# Patient Record
Sex: Male | Born: 1975 | Race: Black or African American | Hispanic: No | Marital: Married | Smoking: Former smoker
Health system: Southern US, Community
[De-identification: ages and names within clinical notes are randomized; demographics above are authoritative.]

## PROBLEM LIST (undated history)

## (undated) DIAGNOSIS — I639 Cerebral infarction, unspecified: Secondary | ICD-10-CM

## (undated) DIAGNOSIS — H269 Unspecified cataract: Secondary | ICD-10-CM

## (undated) DIAGNOSIS — L209 Atopic dermatitis, unspecified: Secondary | ICD-10-CM

## (undated) DIAGNOSIS — E669 Obesity, unspecified: Secondary | ICD-10-CM

## (undated) DIAGNOSIS — Q5310 Unspecified undescended testicle, unilateral: Secondary | ICD-10-CM

## (undated) HISTORY — PX: CHOLECYSTECTOMY: SHX55

---
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
## 2005-09-21 ENCOUNTER — Ambulatory Visit: Payer: Self-pay | Admitting: Family Medicine

## 2005-09-25 ENCOUNTER — Ambulatory Visit: Payer: Self-pay | Admitting: Family Medicine

## 2005-10-11 ENCOUNTER — Ambulatory Visit: Payer: Self-pay | Admitting: Nurse Practitioner

## 2005-10-17 ENCOUNTER — Ambulatory Visit: Payer: Self-pay | Admitting: *Deleted

## 2005-10-23 ENCOUNTER — Ambulatory Visit: Payer: Self-pay | Admitting: Family Medicine

## 2005-10-30 ENCOUNTER — Ambulatory Visit: Payer: Self-pay | Admitting: Family Medicine

## 2005-11-22 ENCOUNTER — Ambulatory Visit: Payer: Self-pay | Admitting: Family Medicine

## 2005-11-27 ENCOUNTER — Ambulatory Visit: Payer: Self-pay | Admitting: Family Medicine

## 2006-02-03 ENCOUNTER — Emergency Department (HOSPITAL_COMMUNITY): Admission: EM | Admit: 2006-02-03 | Discharge: 2006-02-03 | Payer: Self-pay | Admitting: Family Medicine

## 2006-03-13 ENCOUNTER — Ambulatory Visit: Payer: Self-pay | Admitting: Family Medicine

## 2006-05-01 ENCOUNTER — Emergency Department (HOSPITAL_COMMUNITY): Admission: EM | Admit: 2006-05-01 | Discharge: 2006-05-01 | Payer: Self-pay | Admitting: Emergency Medicine

## 2011-04-23 DIAGNOSIS — L309 Dermatitis, unspecified: Secondary | ICD-10-CM | POA: Insufficient documentation

## 2015-12-11 HISTORY — PX: CATARACT EXTRACTION W/ INTRAOCULAR LENS IMPLANT: SHX1309

## 2015-12-20 DIAGNOSIS — L209 Atopic dermatitis, unspecified: Secondary | ICD-10-CM | POA: Insufficient documentation

## 2021-07-26 ENCOUNTER — Encounter (HOSPITAL_COMMUNITY): Payer: Self-pay | Admitting: Emergency Medicine

## 2021-07-26 ENCOUNTER — Emergency Department (HOSPITAL_COMMUNITY): Payer: No Typology Code available for payment source

## 2021-07-26 ENCOUNTER — Emergency Department (HOSPITAL_COMMUNITY)
Admission: EM | Admit: 2021-07-26 | Discharge: 2021-07-27 | Disposition: A | Discharge status: Home / Self Care | Payer: No Typology Code available for payment source | Attending: Emergency Medicine | Admitting: Emergency Medicine

## 2021-07-26 ENCOUNTER — Other Ambulatory Visit: Payer: Self-pay

## 2021-07-26 DIAGNOSIS — S161XXA Strain of muscle, fascia and tendon at neck level, initial encounter: Secondary | ICD-10-CM | POA: Insufficient documentation

## 2021-07-26 DIAGNOSIS — S169XXA Unspecified injury of muscle, fascia and tendon at neck level, initial encounter: Secondary | ICD-10-CM | POA: Diagnosis present

## 2021-07-26 DIAGNOSIS — M546 Pain in thoracic spine: Secondary | ICD-10-CM | POA: Insufficient documentation

## 2021-07-26 DIAGNOSIS — R519 Headache, unspecified: Secondary | ICD-10-CM | POA: Diagnosis not present

## 2021-07-26 DIAGNOSIS — Y92410 Unspecified street and highway as the place of occurrence of the external cause: Secondary | ICD-10-CM | POA: Diagnosis not present
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
## 2021-07-27 MED ORDER — IBUPROFEN 800 MG PO TABS: 800.0000 mg | ORAL_TABLET | Freq: Three times a day (TID) | ORAL | 0 refills | Status: DC

## 2021-07-27 MED ORDER — CYCLOBENZAPRINE HCL 10 MG PO TABS
10.0000 mg | ORAL_TABLET | Freq: Once | ORAL | Status: AC
  Administered 2021-07-27: 10 mg via ORAL
  Filled 2021-07-27: qty 1

## 2021-07-27 MED ORDER — CYCLOBENZAPRINE HCL 10 MG PO TABS: 10.0000 mg | ORAL_TABLET | Freq: Three times a day (TID) | ORAL | 0 refills | Status: DC | PRN

## 2021-07-27 MED ORDER — IBUPROFEN 800 MG PO TABS
800.0000 mg | ORAL_TABLET | Freq: Once | ORAL | Status: AC
  Administered 2021-07-27: 800 mg via ORAL
  Filled 2021-07-27: qty 1
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
## 2021-07-27 NOTE — ED Notes (Signed)
C-collar removed by pa

## 2022-04-23 DIAGNOSIS — N183 Chronic kidney disease, stage 3 unspecified: Secondary | ICD-10-CM | POA: Insufficient documentation

## 2022-12-09 ENCOUNTER — Other Ambulatory Visit: Payer: Self-pay

## 2022-12-09 ENCOUNTER — Emergency Department (HOSPITAL_COMMUNITY)
Admission: EM | Admit: 2022-12-09 | Discharge: 2022-12-09 | Disposition: A | Discharge status: Home / Self Care | Payer: PRIVATE HEALTH INSURANCE | Attending: Emergency Medicine | Admitting: Emergency Medicine

## 2022-12-09 DIAGNOSIS — J101 Influenza due to other identified influenza virus with other respiratory manifestations: Secondary | ICD-10-CM | POA: Diagnosis not present

## 2022-12-09 DIAGNOSIS — Z1152 Encounter for screening for COVID-19: Secondary | ICD-10-CM | POA: Insufficient documentation

## 2022-12-09 DIAGNOSIS — R509 Fever, unspecified: Secondary | ICD-10-CM | POA: Diagnosis present

## 2022-12-09 LAB — RESP PANEL BY RT-PCR (RSV, FLU A&B, COVID)  RVPGX2
Influenza A by PCR: NEGATIVE
Influenza B by PCR: POSITIVE — AB
Resp Syncytial Virus by PCR: NEGATIVE
SARS Coronavirus 2 by RT PCR: NEGATIVE

## 2022-12-09 MED ORDER — ACETAMINOPHEN 500 MG PO TABS
1000.0000 mg | ORAL_TABLET | Freq: Once | ORAL | Status: AC
  Administered 2022-12-09: 1000 mg via ORAL
  Filled 2022-12-09: qty 2

## 2022-12-09 NOTE — ED Triage Notes (Signed)
Patient coming to ED for evaluation of fever, cough, congestion, and generalized body aches.  Reports multiple family member are positive for COVID.  Symptoms started 3 days ago.  Has been taking OTC with minimal relief

## 2022-12-09 NOTE — ED Provider Notes (Signed)
[facility]-EMERGENCY DEPT Provider Note   CSN: [id number] Arrival date & time: 12/09/22  1856     History  Chief Complaint  Patient presents with   Fever   Nasal Congestion    Wayne Lee is a 46 y.o. male.  Patient presents to the emergency department complaining of fever, cough, congestion, and generalized bodyaches which have been ongoing for 3 days.  Patient reports that family members have tested positive for COVID but he has tested negative for COVID 3 times.  He has been over-the-counter pain medication with little relief.  He denies shortness of breath, chest pain, nausea, vomiting.  No relevant past medical history on file  HPI     Home Medications Prior to Admission medications   Medication Sig Start Date End Date Taking? Authorizing Provider  cyclobenzaprine (FLEXERIL) 10 MG tablet Take 1 tablet (10 mg total) by mouth 3 (three) times daily as needed for muscle spasms. 07/27/21   Roxy Horseman, PA-C  ibuprofen (ADVIL) 800 MG tablet Take 1 tablet (800 mg total) by mouth 3 (three) times daily. 07/27/21   Roxy Horseman, PA-C      Allergies    Penicillins    Review of Systems   Review of Systems  Constitutional:  Positive for chills and fever.  HENT:  Positive for congestion.   Respiratory:  Positive for cough.   Musculoskeletal:  Positive for myalgias.    Physical Exam Updated Vital Signs BP (!) 149/85 (BP Location: Left Arm)   Pulse 88   Temp (!) 102.7 F (39.3 C) (Oral)   Resp 18   Ht 5\' 8"  (1.727 m)   Wt 109.5 kg   SpO2 95%   BMI 36.72 kg/m  Physical Exam Vitals and nursing note reviewed.  Constitutional:      General: He is not in acute distress.    Appearance: He is well-developed.  HENT:     Head: Normocephalic and atraumatic.  Eyes:     Conjunctiva/sclera: Conjunctivae normal.  Cardiovascular:     Rate and Rhythm: Normal rate and regular rhythm.     Heart sounds: No murmur heard. Pulmonary:     Effort:  Pulmonary effort is normal. No respiratory distress.     Breath sounds: Normal breath sounds.  Abdominal:     Palpations: Abdomen is soft.     Tenderness: There is no abdominal tenderness.  Musculoskeletal:        General: No swelling.     Cervical back: Neck supple.  Skin:    General: Skin is warm and dry.     Capillary Refill: Capillary refill takes less than 2 seconds.  Neurological:     Mental Status: He is alert.  Psychiatric:        Mood and Affect: Mood normal.     ED Results / Procedures / Treatments   Labs (all labs ordered are listed, but only abnormal results are displayed) Labs Reviewed  RESP PANEL BY RT-PCR (RSV, FLU A&B, COVID)  RVPGX2 - Abnormal; Notable for the following components:      Result Value   Influenza B by PCR POSITIVE (*)    All other components within normal limits    EKG None  Radiology No results found.  Procedures Procedures    Medications Ordered in ED Medications  acetaminophen (TYLENOL) tablet 1,000 mg (1,000 mg Oral Given 12/09/22 2029)    ED Course/ Medical Decision Making/ A&P  Medical Decision Making Risk OTC drugs.   Patient presents chief complaint of fever, cough, congestion, body aches.  Differential diagnosis includes but is not limited to COVID-19, influenza, RSV, other viral illnesses, and others  No recent relevant past medical history was on file for review  I ordered and interpreted labs.  Pertinent results include positive influenza B test result  The patient's lungs are clear to auscultation bilaterally.  There is no indication at this time for chest imaging  I ordered the patient Tylenol for his fever.  Upon reassessment the patient's fever had decreased and he was feeling somewhat better  The patient tested positive for influenza B.  This is consistent with the symptoms he has described.  Plan to discharge patient home with recommendations for over-the-counter medications for  continued therapy at home.  He is well outside the window for Tamiflu at this time.  Patient provided work note at his request        Final Clinical Impression(s) / ED Diagnoses Final diagnoses:  Influenza B    Rx / DC Orders ED Discharge Orders     None         Pamala Duffel 12/09/22 2132    Bethann Berkshire, MD 12/12/22 1243

## 2022-12-09 NOTE — ED Provider Triage Note (Signed)
Emergency Medicine Provider Triage Evaluation Note  Jahi Roza , a 46 y.o. male  was evaluated in triage.  Pt complains of 3 days of bodyaches, chills, cough, nasal congestion.  Patient states that multiple family members were diagnosed with COVID but he has had 3 negative COVID test at home.  Patient denies shortness of breath, chest pain, abdominal pain, nausea, vomiting  Review of Systems  Positive: As above Negative: As above  Physical Exam  There were no vitals taken for this visit. Gen:   Awake, no distress   Resp:  Normal effort  MSK:   Moves extremities without difficulty  Other:    Medical Decision Making  Medically screening exam initiated at 8:02 PM.  Appropriate orders placed.  Omarion Werling was informed that the remainder of the evaluation will be completed by another provider, this initial triage assessment does not replace that evaluation, and the importance of remaining in the ED until their evaluation is complete.     Pamala Duffel 12/09/22 2003

## 2022-12-09 NOTE — Discharge Instructions (Addendum)
You were diagnosed today with influenza.  Please take over-the-counter medication such as acetaminophen and ibuprofen as needed for fever and pain control.  Your body will fight this illness on its own over time.  Please get plenty of rest and hydrate as you are able.  If you develop any life-threatening symptoms please return to the emergency department

## 2023-05-05 IMAGING — CT CT CERVICAL SPINE W/O CM
3 of 4 series · 12 of 33 positions shown, 14 images · non-contrast
Comparison: None.

CLINICAL DATA: Head trauma

EXAM:
CT HEAD WITHOUT CONTRAST
CT CERVICAL SPINE WITHOUT CONTRAST
TECHNIQUE: Multidetector CT imaging of the head and cervical spine was
performed following the standard protocol without intravenous
contrast. Multiplanar CT image reconstructions of the cervical spine
were also generated.

[Series 11: sag bone · sagittal · 0.30mm/px · 5 of 54 slices shown, 6 images]
[im 18/54  bone]
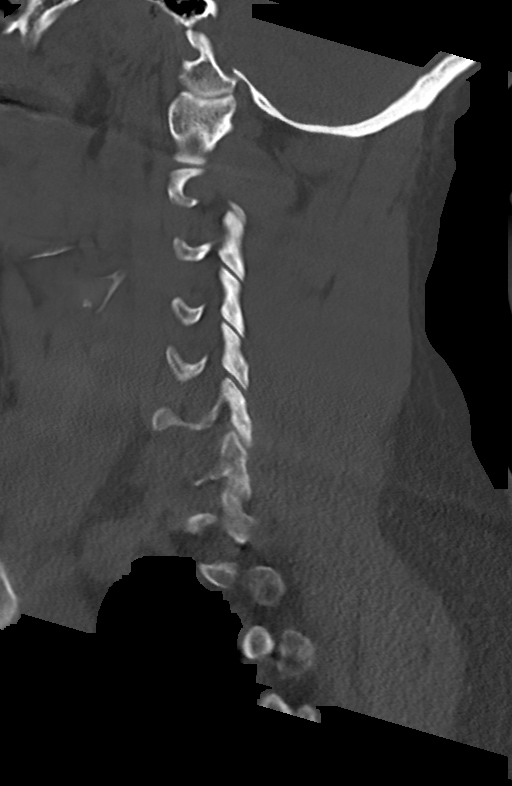
[im 23/54  bone]
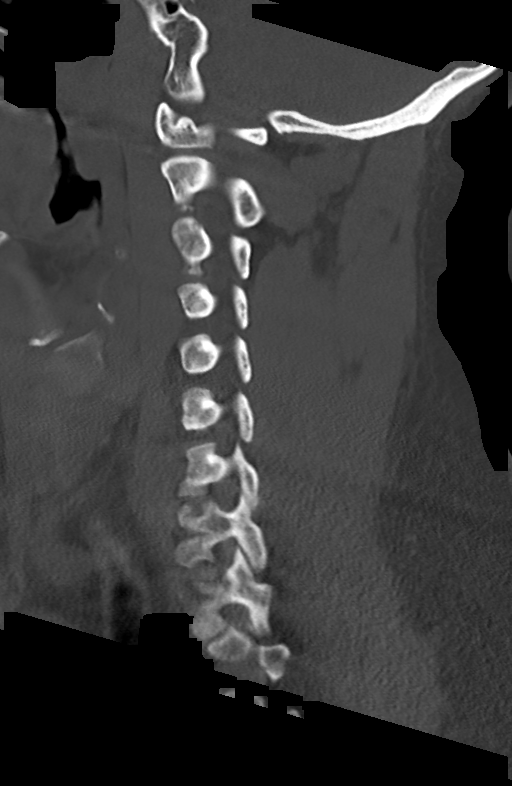
[im 27/54  soft-tissue]
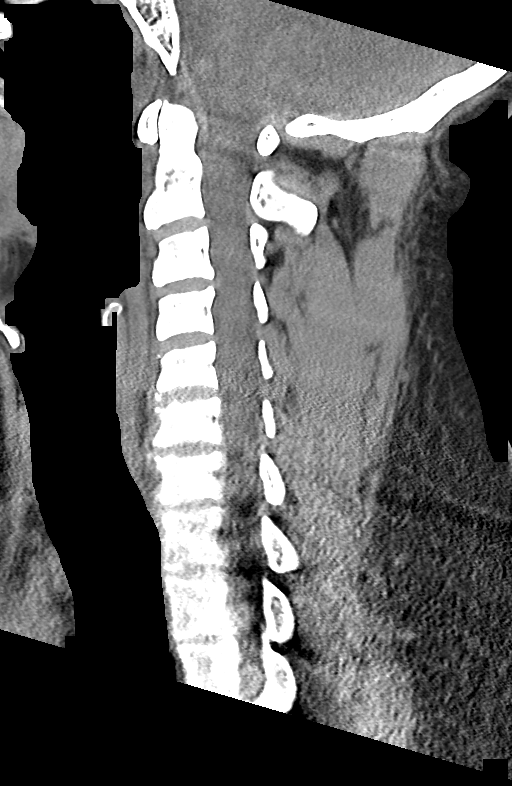
[im 27/54  bone]
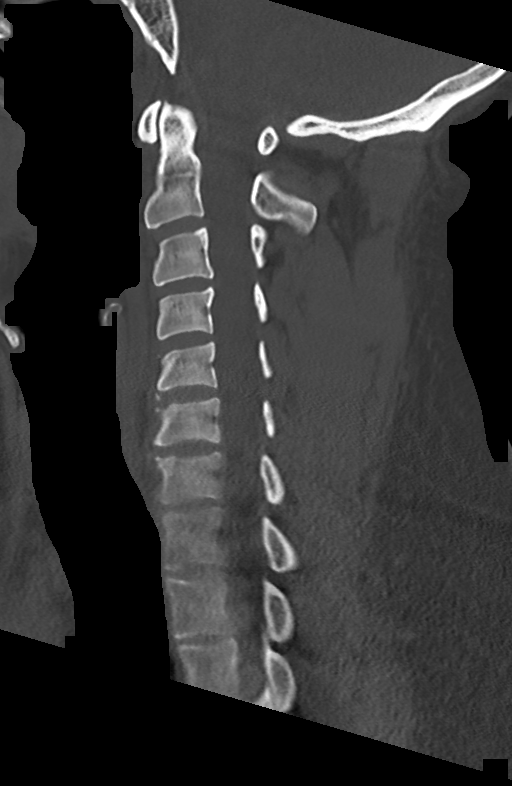
[im 31/54  bone]
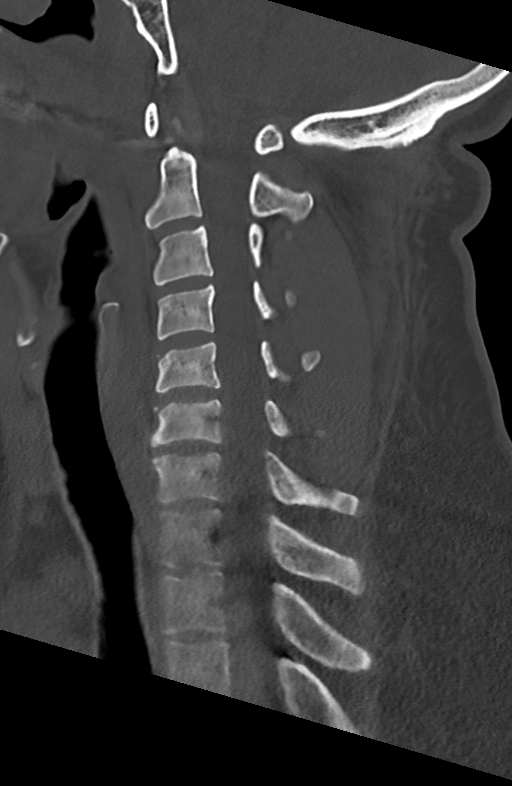
[im 36/54  bone]
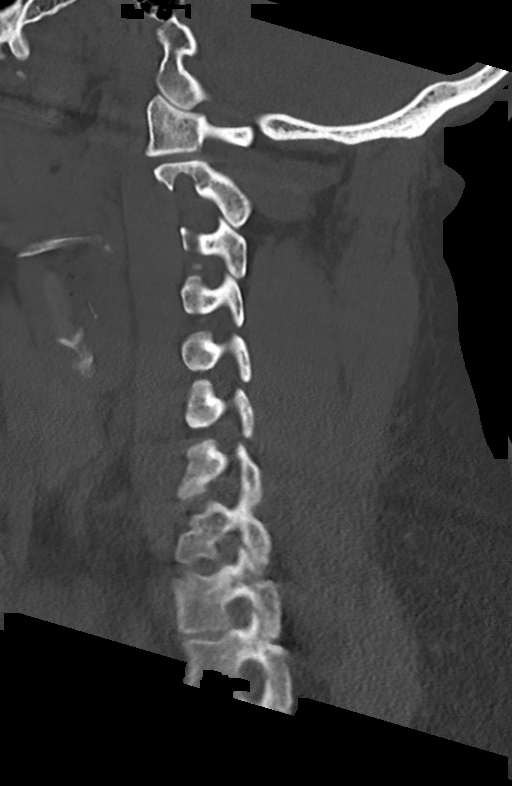

[Series 12: cor bone · coronal · 0.31mm/px · 3 of 71 slices shown]
[im 15/71  bone]
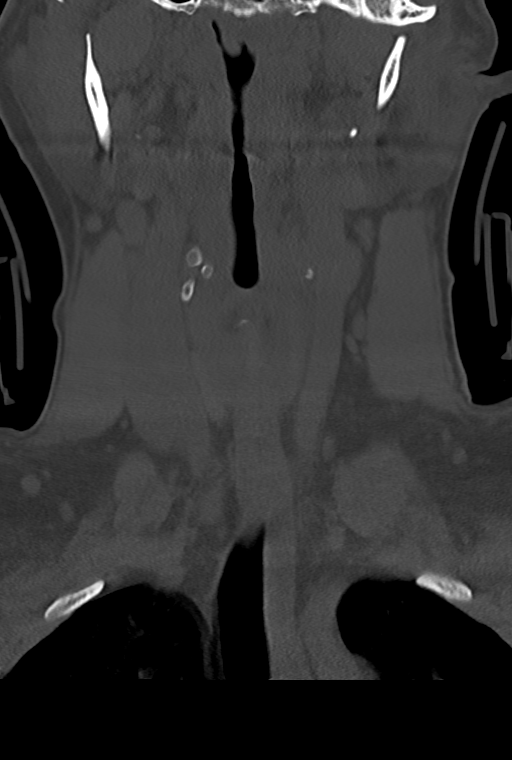
[im 29/71  bone]
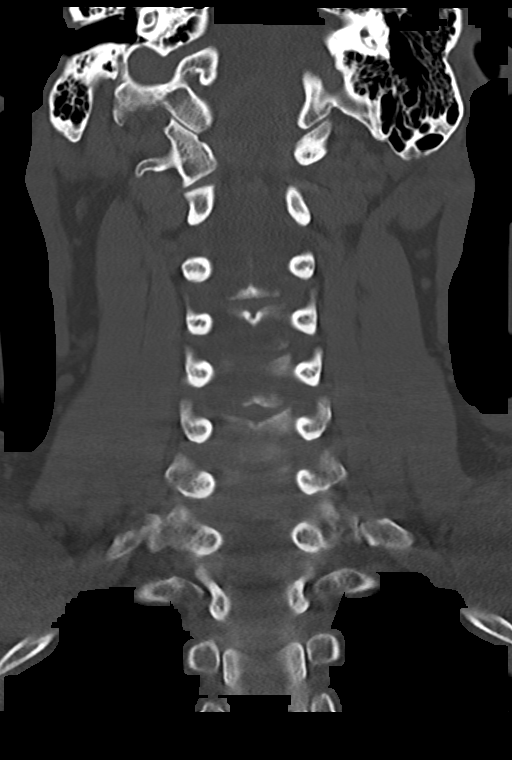
[im 43/71  bone]
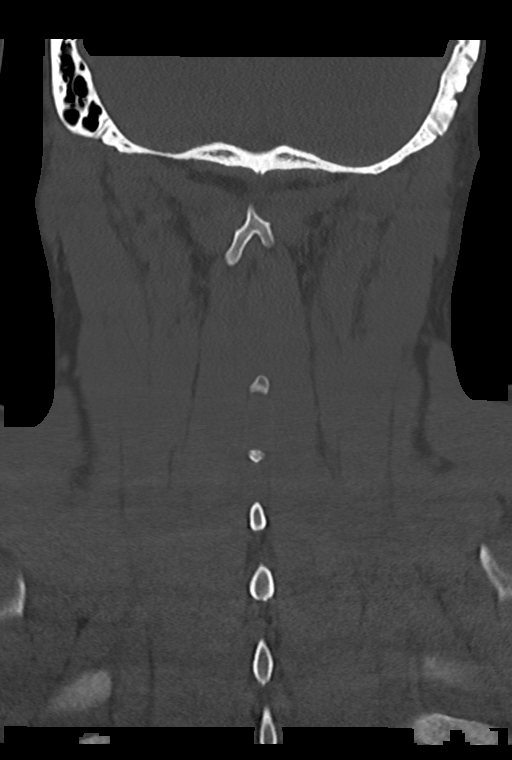

[Series 13: orthogonal axials · axial · 0.21mm/px · z∈[-297,-179]mm · 4 of 98 slices shown, 5 images]
[im 17/98  soft-tissue]
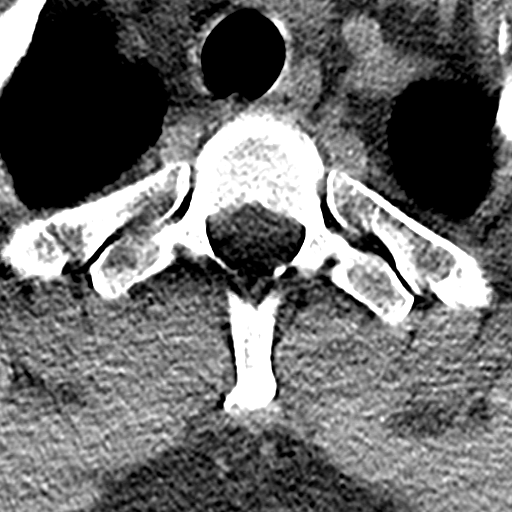
[im 17/98  bone]
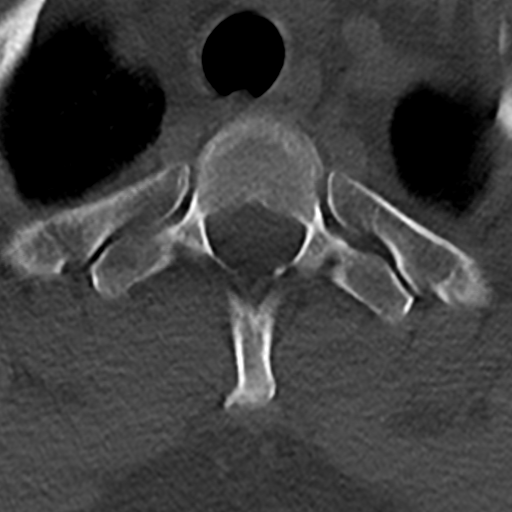
[im 33/98  bone]
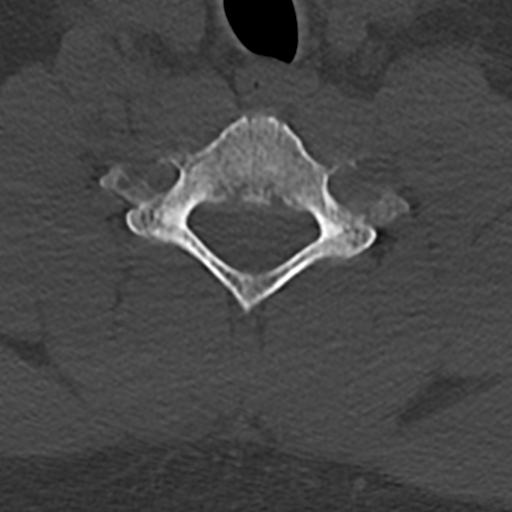
[im 65/98  bone]
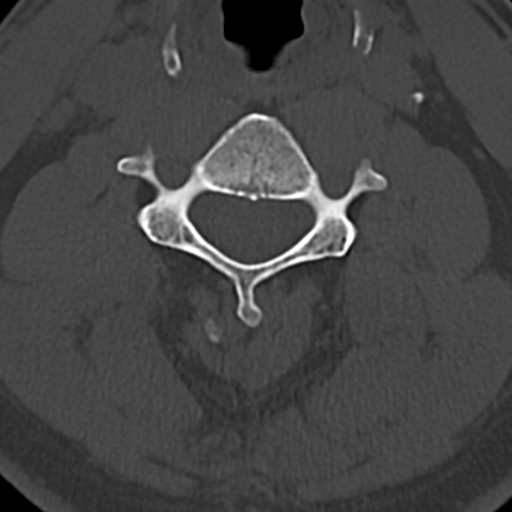
[im 81/98  bone]
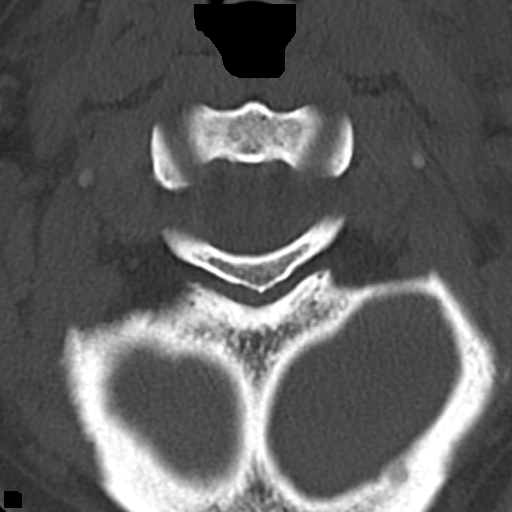

[12 of 33 positions shown; findings below may reference images not displayed]

FINDINGS: CT HEAD FINDINGS

Brain: No acute territorial infarction, hemorrhage or intracranial
mass. The ventricles are nonenlarged.

Vascular: No hyperdense vessels.  No unexpected calcification

Skull: Normal. Negative for fracture or focal lesion.

Sinuses/Orbits: No acute finding.

Other: None

CT CERVICAL SPINE FINDINGS

Alignment: Straightening of the cervical spine. No subluxation.
Facet alignment within normal limits

Skull base and vertebrae: No acute fracture. No primary bone lesion
or focal pathologic process.

Soft tissues and spinal canal: No prevertebral fluid or swelling. No
visible canal hematoma.

Disc levels:  Mild degenerative changes C6-C7

Upper chest: Negative.

Other: None
IMPRESSION: 1. Negative non contrasted CT appearance of the brain.
2. Straightening of the cervical spine. No acute osseous abnormality

## 2023-12-16 ENCOUNTER — Inpatient Hospital Stay (HOSPITAL_COMMUNITY)
Admission: EM | Disposition: A | Discharge status: Rehabilitation Facility | Payer: Self-pay | Source: Home / Self Care | Attending: Neurological Surgery

## 2023-12-16 ENCOUNTER — Inpatient Hospital Stay (HOSPITAL_COMMUNITY)
Admission: EM | Admit: 2023-12-16 | Discharge: 2023-12-20 | DRG: 023 | Disposition: A | Discharge status: Rehabilitation Facility | Payer: 59 | Attending: Internal Medicine | Admitting: Internal Medicine

## 2023-12-16 ENCOUNTER — Inpatient Hospital Stay (HOSPITAL_COMMUNITY): Payer: 59

## 2023-12-16 ENCOUNTER — Encounter (HOSPITAL_COMMUNITY): Payer: Self-pay | Admitting: Neurology

## 2023-12-16 ENCOUNTER — Inpatient Hospital Stay (HOSPITAL_COMMUNITY): Payer: PRIVATE HEALTH INSURANCE

## 2023-12-16 ENCOUNTER — Emergency Department (HOSPITAL_COMMUNITY): Payer: 59

## 2023-12-16 ENCOUNTER — Inpatient Hospital Stay (HOSPITAL_COMMUNITY): Payer: 59 | Admitting: Anesthesiology

## 2023-12-16 ENCOUNTER — Other Ambulatory Visit: Payer: Self-pay

## 2023-12-16 DIAGNOSIS — G4730 Sleep apnea, unspecified: Secondary | ICD-10-CM | POA: Diagnosis present

## 2023-12-16 DIAGNOSIS — I6389 Other cerebral infarction: Secondary | ICD-10-CM

## 2023-12-16 DIAGNOSIS — I614 Nontraumatic intracerebral hemorrhage in cerebellum: Principal | ICD-10-CM | POA: Diagnosis present

## 2023-12-16 DIAGNOSIS — I161 Hypertensive emergency: Secondary | ICD-10-CM | POA: Diagnosis present

## 2023-12-16 DIAGNOSIS — G441 Vascular headache, not elsewhere classified: Secondary | ICD-10-CM | POA: Diagnosis not present

## 2023-12-16 DIAGNOSIS — Z880 Allergy status to penicillin: Secondary | ICD-10-CM | POA: Diagnosis not present

## 2023-12-16 DIAGNOSIS — E785 Hyperlipidemia, unspecified: Secondary | ICD-10-CM | POA: Diagnosis present

## 2023-12-16 DIAGNOSIS — R7401 Elevation of levels of liver transaminase levels: Secondary | ICD-10-CM | POA: Diagnosis present

## 2023-12-16 DIAGNOSIS — I6629 Occlusion and stenosis of unspecified posterior cerebral artery: Secondary | ICD-10-CM | POA: Diagnosis present

## 2023-12-16 DIAGNOSIS — R442 Other hallucinations: Secondary | ICD-10-CM | POA: Diagnosis present

## 2023-12-16 DIAGNOSIS — R748 Abnormal levels of other serum enzymes: Secondary | ICD-10-CM | POA: Diagnosis present

## 2023-12-16 DIAGNOSIS — I629 Nontraumatic intracranial hemorrhage, unspecified: Secondary | ICD-10-CM

## 2023-12-16 DIAGNOSIS — R1310 Dysphagia, unspecified: Secondary | ICD-10-CM | POA: Diagnosis present

## 2023-12-16 DIAGNOSIS — J690 Pneumonitis due to inhalation of food and vomit: Secondary | ICD-10-CM | POA: Diagnosis present

## 2023-12-16 DIAGNOSIS — J9690 Respiratory failure, unspecified, unspecified whether with hypoxia or hypercapnia: Secondary | ICD-10-CM | POA: Diagnosis present

## 2023-12-16 DIAGNOSIS — I619 Nontraumatic intracerebral hemorrhage, unspecified: Secondary | ICD-10-CM

## 2023-12-16 DIAGNOSIS — R739 Hyperglycemia, unspecified: Secondary | ICD-10-CM | POA: Diagnosis present

## 2023-12-16 DIAGNOSIS — G935 Compression of brain: Secondary | ICD-10-CM | POA: Diagnosis present

## 2023-12-16 DIAGNOSIS — R509 Fever, unspecified: Secondary | ICD-10-CM | POA: Diagnosis not present

## 2023-12-16 DIAGNOSIS — Z961 Presence of intraocular lens: Secondary | ICD-10-CM | POA: Diagnosis present

## 2023-12-16 DIAGNOSIS — G9340 Encephalopathy, unspecified: Secondary | ICD-10-CM | POA: Diagnosis present

## 2023-12-16 DIAGNOSIS — R29709 NIHSS score 9: Secondary | ICD-10-CM | POA: Diagnosis present

## 2023-12-16 DIAGNOSIS — G4710 Hypersomnia, unspecified: Secondary | ICD-10-CM | POA: Diagnosis present

## 2023-12-16 DIAGNOSIS — I10 Essential (primary) hypertension: Secondary | ICD-10-CM | POA: Diagnosis present

## 2023-12-16 DIAGNOSIS — Z6838 Body mass index (BMI) 38.0-38.9, adult: Secondary | ICD-10-CM

## 2023-12-16 DIAGNOSIS — G4700 Insomnia, unspecified: Secondary | ICD-10-CM | POA: Diagnosis not present

## 2023-12-16 DIAGNOSIS — R519 Headache, unspecified: Secondary | ICD-10-CM | POA: Diagnosis present

## 2023-12-16 DIAGNOSIS — R270 Ataxia, unspecified: Secondary | ICD-10-CM | POA: Diagnosis not present

## 2023-12-16 DIAGNOSIS — G4733 Obstructive sleep apnea (adult) (pediatric): Secondary | ICD-10-CM | POA: Diagnosis present

## 2023-12-16 DIAGNOSIS — E876 Hypokalemia: Secondary | ICD-10-CM | POA: Diagnosis present

## 2023-12-16 DIAGNOSIS — K5900 Constipation, unspecified: Secondary | ICD-10-CM | POA: Diagnosis present

## 2023-12-16 DIAGNOSIS — M25552 Pain in left hip: Secondary | ICD-10-CM | POA: Diagnosis present

## 2023-12-16 DIAGNOSIS — Z9842 Cataract extraction status, left eye: Secondary | ICD-10-CM

## 2023-12-16 DIAGNOSIS — D62 Acute posthemorrhagic anemia: Secondary | ICD-10-CM | POA: Diagnosis present

## 2023-12-16 HISTORY — DX: Unspecified undescended testicle, unilateral: Q53.10

## 2023-12-16 HISTORY — DX: Atopic dermatitis, unspecified: L20.9

## 2023-12-16 HISTORY — PX: SUBOCCIPITAL CRANIECTOMY CERVICAL LAMINECTOMY: SHX5404

## 2023-12-16 HISTORY — DX: Unspecified cataract: H26.9

## 2023-12-16 HISTORY — DX: Obesity, unspecified: E66.9

## 2023-12-16 LAB — CBC
HCT: 46 % (ref 39.0–52.0)
HCT: 36.3 % — ABNORMAL LOW (ref 39.0–52.0)
Hemoglobin: 14.7 g/dL (ref 13.0–17.0)
Hemoglobin: 11.9 g/dL — ABNORMAL LOW (ref 13.0–17.0)
MCH: 27.5 pg (ref 26.0–34.0)
MCH: 27.4 pg (ref 26.0–34.0)
MCHC: 32 g/dL (ref 30.0–36.0)
MCHC: 32.8 g/dL (ref 30.0–36.0)
MCV: 86.1 fL (ref 80.0–100.0)
MCV: 83.6 fL (ref 80.0–100.0)
Platelets: 297 10*3/uL (ref 150–400)
Platelets: 226 10*3/uL (ref 150–400)
RBC: 5.34 MIL/uL (ref 4.22–5.81)
RBC: 4.34 MIL/uL (ref 4.22–5.81)
RDW: 13.2 % (ref 11.5–15.5)
RDW: 13.2 % (ref 11.5–15.5)
WBC: 9.3 10*3/uL (ref 4.0–10.5)
WBC: 4.8 10*3/uL (ref 4.0–10.5)
nRBC: 0 % (ref 0.0–0.2)
nRBC: 0 % (ref 0.0–0.2)

## 2023-12-16 LAB — I-STAT CHEM 8, ED
BUN: 17 mg/dL (ref 6–20)
Calcium, Ion: 1.17 mmol/L (ref 1.15–1.40)
Chloride: 104 mmol/L (ref 98–111)
Creatinine, Ser: 1.2 mg/dL (ref 0.61–1.24)
Glucose, Bld: 160 mg/dL — ABNORMAL HIGH (ref 70–99)
HCT: 46 % (ref 39.0–52.0)
Hemoglobin: 15.6 g/dL (ref 13.0–17.0)
Potassium: 3.2 mmol/L — ABNORMAL LOW (ref 3.5–5.1)
Sodium: 141 mmol/L (ref 135–145)
TCO2: 25 mmol/L (ref 22–32)

## 2023-12-16 LAB — BASIC METABOLIC PANEL
Anion gap: 11 (ref 5–15)
BUN: 11 mg/dL (ref 6–20)
CO2: 20 mmol/L — ABNORMAL LOW (ref 22–32)
Calcium: 8.6 mg/dL — ABNORMAL LOW (ref 8.9–10.3)
Chloride: 110 mmol/L (ref 98–111)
Creatinine, Ser: 1.01 mg/dL (ref 0.61–1.24)
GFR, Estimated: >60 mL/min (ref >60)
Glucose, Bld: 140 mg/dL — ABNORMAL HIGH (ref 70–99)
Potassium: 4 mmol/L (ref 3.5–5.1)
Sodium: 141 mmol/L (ref 135–145)

## 2023-12-16 LAB — ECHOCARDIOGRAM COMPLETE
Area-P 1/2: 4.89 cm2
Calc EF: 61 %
Height: 68 in
S' Lateral: 2.4 cm
Single Plane A2C EF: 57.9 %
Single Plane A4C EF: 66.8 %
Weight: 4007.08 [oz_av]

## 2023-12-16 LAB — APTT: aPTT: 23 s — ABNORMAL LOW (ref 24–36)

## 2023-12-16 LAB — COMPREHENSIVE METABOLIC PANEL
ALT: 76 U/L — ABNORMAL HIGH (ref 0–44)
AST: 51 U/L — ABNORMAL HIGH (ref 15–41)
Albumin: 4 g/dL (ref 3.5–5.0)
Alkaline Phosphatase: 98 U/L (ref 38–126)
Anion gap: 14 (ref 5–15)
BUN: 14 mg/dL (ref 6–20)
CO2: 22 mmol/L (ref 22–32)
Calcium: 9.5 mg/dL (ref 8.9–10.3)
Chloride: 102 mmol/L (ref 98–111)
Creatinine, Ser: 1.18 mg/dL (ref 0.61–1.24)
GFR, Estimated: >60 mL/min (ref >60)
Glucose, Bld: 160 mg/dL — ABNORMAL HIGH (ref 70–99)
Potassium: 3.1 mmol/L — ABNORMAL LOW (ref 3.5–5.1)
Sodium: 138 mmol/L (ref 135–145)
Total Bilirubin: 0.7 mg/dL (ref 0.0–1.2)
Total Protein: 7.4 g/dL (ref 6.5–8.1)

## 2023-12-16 LAB — POCT I-STAT 7, (LYTES, BLD GAS, ICA,H+H)
Acid-base deficit: 3 mmol/L — ABNORMAL HIGH (ref 0.0–2.0)
Acid-base deficit: 4 mmol/L — ABNORMAL HIGH (ref 0.0–2.0)
Bicarbonate: 22.9 mmol/L (ref 20.0–28.0)
Bicarbonate: 21.6 mmol/L (ref 20.0–28.0)
Calcium, Ion: 1.19 mmol/L (ref 1.15–1.40)
Calcium, Ion: 1.26 mmol/L (ref 1.15–1.40)
HCT: 38 % — ABNORMAL LOW (ref 39.0–52.0)
HCT: 35 % — ABNORMAL LOW (ref 39.0–52.0)
Hemoglobin: 12.9 g/dL — ABNORMAL LOW (ref 13.0–17.0)
Hemoglobin: 11.9 g/dL — ABNORMAL LOW (ref 13.0–17.0)
O2 Saturation: 100 %
O2 Saturation: 99 %
Patient temperature: 34.9
Patient temperature: 35.6
Potassium: 4.4 mmol/L (ref 3.5–5.1)
Potassium: 4 mmol/L (ref 3.5–5.1)
Sodium: 141 mmol/L (ref 135–145)
Sodium: 143 mmol/L (ref 135–145)
TCO2: 24 mmol/L (ref 22–32)
TCO2: 23 mmol/L (ref 22–32)
pCO2 arterial: 37.5 mm[Hg] (ref 32–48)
pCO2 arterial: 37.5 mm[Hg] (ref 32–48)
pH, Arterial: 7.384 (ref 7.35–7.45)
pH, Arterial: 7.36 (ref 7.35–7.45)
pO2, Arterial: 289 mm[Hg] — ABNORMAL HIGH (ref 83–108)
pO2, Arterial: 134 mm[Hg] — ABNORMAL HIGH (ref 83–108)

## 2023-12-16 LAB — HIV ANTIBODY (ROUTINE TESTING W REFLEX): HIV Screen 4th Generation wRfx: NONREACTIVE

## 2023-12-16 LAB — DIFFERENTIAL
Abs Immature Granulocytes: 0.01 10*3/uL (ref 0.00–0.07)
Basophils Absolute: 0 10*3/uL (ref 0.0–0.1)
Basophils Relative: 0 %
Eosinophils Absolute: 0.6 10*3/uL — ABNORMAL HIGH (ref 0.0–0.5)
Eosinophils Relative: 6 %
Immature Granulocytes: 0 %
Lymphocytes Relative: 61 %
Lymphs Abs: 5.7 10*3/uL — ABNORMAL HIGH (ref 0.7–4.0)
Monocytes Absolute: 1.2 10*3/uL — ABNORMAL HIGH (ref 0.1–1.0)
Monocytes Relative: 13 %
Neutro Abs: 1.8 10*3/uL (ref 1.7–7.7)
Neutrophils Relative %: 20 %

## 2023-12-16 LAB — GLUCOSE, CAPILLARY
Glucose-Capillary: 110 mg/dL — ABNORMAL HIGH (ref 70–99)
Glucose-Capillary: 120 mg/dL — ABNORMAL HIGH (ref 70–99)
Glucose-Capillary: 126 mg/dL — ABNORMAL HIGH (ref 70–99)
Glucose-Capillary: 127 mg/dL — ABNORMAL HIGH (ref 70–99)
Glucose-Capillary: 176 mg/dL — ABNORMAL HIGH (ref 70–99)

## 2023-12-16 LAB — ABO/RH: ABO/RH(D): O POS

## 2023-12-16 LAB — PROTIME-INR
INR: 1.1 (ref 0.8–1.2)
Prothrombin Time: 14.1 s (ref 11.4–15.2)

## 2023-12-16 LAB — SODIUM
Sodium: 137 mmol/L (ref 135–145)
Sodium: 142 mmol/L (ref 135–145)
Sodium: 144 mmol/L (ref 135–145)

## 2023-12-16 LAB — CBG MONITORING, ED
Glucose-Capillary: 132 mg/dL — ABNORMAL HIGH (ref 70–99)
Glucose-Capillary: 144 mg/dL — ABNORMAL HIGH (ref 70–99)

## 2023-12-16 LAB — MAGNESIUM: Magnesium: 1.8 mg/dL (ref 1.7–2.4)

## 2023-12-16 LAB — ETHANOL: Alcohol, Ethyl (B): <10 mg/dL (ref <10)

## 2023-12-16 LAB — RAPID URINE DRUG SCREEN, HOSP PERFORMED
Amphetamines: NOT DETECTED
Barbiturates: NOT DETECTED
Benzodiazepines: NOT DETECTED
Cocaine: NOT DETECTED
Opiates: NOT DETECTED
Tetrahydrocannabinol: NOT DETECTED

## 2023-12-16 LAB — TYPE AND SCREEN
ABO/RH(D): O POS
Antibody Screen: NEGATIVE

## 2023-12-16 LAB — MRSA NEXT GEN BY PCR, NASAL: MRSA by PCR Next Gen: NOT DETECTED

## 2023-12-16 SURGERY — SUBOCCIPITAL CRANIECTOMY CERVICAL LAMINECTOMY/DURAPLASTY
Anesthesia: General | Site: Head

## 2023-12-16 MED ORDER — CEFAZOLIN SODIUM-DEXTROSE 1-4 GM/50ML-% IV SOLN: 1.0000 g | Freq: Three times a day (TID) | INTRAVENOUS | Status: DC

## 2023-12-16 MED ORDER — FENTANYL BOLUS VIA INFUSION
50.0000 ug | INTRAVENOUS | Status: DC | PRN
  Administered 2023-12-16 – 2023-12-17 (×4): 50 ug via INTRAVENOUS

## 2023-12-16 MED ORDER — CEFAZOLIN SODIUM-DEXTROSE 2-4 GM/100ML-% IV SOLN
2.0000 g | Freq: Three times a day (TID) | INTRAVENOUS | Status: AC
  Administered 2023-12-16 (×2): 2 g via INTRAVENOUS
  Filled 2023-12-16 (×2): qty 100

## 2023-12-16 MED ORDER — CLEVIDIPINE BUTYRATE 0.5 MG/ML IV EMUL
0.0000 mg/h | INTRAVENOUS | Status: DC
  Administered 2023-12-16: 2 mg/h via INTRAVENOUS
  Administered 2023-12-17: 4 mg/h via INTRAVENOUS
  Filled 2023-12-16 (×2): qty 50

## 2023-12-16 MED ORDER — POLYETHYLENE GLYCOL 3350 17 G PO PACK
17.0000 g | PACK | Freq: Every day | ORAL | Status: DC
  Administered 2023-12-16: 17 g
  Filled 2023-12-16: qty 1

## 2023-12-16 MED ORDER — 0.9 % SODIUM CHLORIDE (POUR BTL) OPTIME
TOPICAL | Status: DC | PRN
  Administered 2023-12-16: 2000 mL

## 2023-12-16 MED ORDER — CEFAZOLIN SODIUM-DEXTROSE 2-3 GM-%(50ML) IV SOLR
INTRAVENOUS | Status: DC | PRN
  Administered 2023-12-16: 2 g via INTRAVENOUS

## 2023-12-16 MED ORDER — INSULIN ASPART 100 UNIT/ML IJ SOLN
0.0000 [IU] | INTRAMUSCULAR | Status: DC
  Administered 2023-12-16: 3 [IU] via SUBCUTANEOUS
  Administered 2023-12-16: 2 [IU] via SUBCUTANEOUS

## 2023-12-16 MED ORDER — ROCURONIUM BROMIDE 100 MG/10ML IV SOLN
INTRAVENOUS | Status: DC | PRN
  Administered 2023-12-16: 100 mg via INTRAVENOUS
  Administered 2023-12-16: 50 mg via INTRAVENOUS

## 2023-12-16 MED ORDER — ALBUMIN HUMAN 5 % IV SOLN: INTRAVENOUS | Status: DC | PRN

## 2023-12-16 MED ORDER — PHENYLEPHRINE HCL-NACL 20-0.9 MG/250ML-% IV SOLN
INTRAVENOUS | Status: DC | PRN
  Administered 2023-12-16: 20 ug/min via INTRAVENOUS

## 2023-12-16 MED ORDER — POLYETHYLENE GLYCOL 3350 17 G PO PACK
17.0000 g | PACK | Freq: Every day | ORAL | Status: DC | PRN
Start: 2023-12-16 — End: 2023-12-16

## 2023-12-16 MED ORDER — SODIUM CHLORIDE 0.9% FLUSH: 3.0000 mL | Freq: Once | INTRAVENOUS | Status: DC

## 2023-12-16 MED ORDER — THROMBIN 5000 UNITS EX SOLR
CUTANEOUS | Status: AC
  Filled 2023-12-16: qty 5000

## 2023-12-16 MED ORDER — PERFLUTREN LIPID MICROSPHERE
1.0000 mL | INTRAVENOUS | Status: AC | PRN
  Administered 2023-12-16: 1 mL via INTRAVENOUS

## 2023-12-16 MED ORDER — SODIUM CHLORIDE 3 % IV SOLN
INTRAVENOUS | Status: DC
  Filled 2023-12-16 (×2): qty 500

## 2023-12-16 MED ORDER — SENNOSIDES-DOCUSATE SODIUM 8.6-50 MG PO TABS
1.0000 | ORAL_TABLET | Freq: Two times a day (BID) | ORAL | Status: DC
  Administered 2023-12-16 (×2): 1
  Filled 2023-12-16 (×3): qty 1

## 2023-12-16 MED ORDER — IOHEXOL 350 MG/ML SOLN
75.0000 mL | Freq: Once | INTRAVENOUS | Status: AC | PRN
  Administered 2023-12-16: 75 mL via INTRAVENOUS

## 2023-12-16 MED ORDER — BACITRACIN ZINC 500 UNIT/GM EX OINT
TOPICAL_OINTMENT | CUTANEOUS | Status: AC
  Filled 2023-12-16: qty 28.35

## 2023-12-16 MED ORDER — BUPIVACAINE HCL (PF) 0.5 % IJ SOLN
INTRAMUSCULAR | Status: AC
  Filled 2023-12-16: qty 30

## 2023-12-16 MED ORDER — PANTOPRAZOLE SODIUM 40 MG IV SOLR
40.0000 mg | Freq: Every day | INTRAVENOUS | Status: DC
  Administered 2023-12-16 – 2023-12-17 (×2): 40 mg via INTRAVENOUS
  Filled 2023-12-16 (×2): qty 10

## 2023-12-16 MED ORDER — SODIUM CHLORIDE 3 % IV BOLUS
250.0000 mL | Freq: Once | INTRAVENOUS | Status: AC
  Administered 2023-12-16: 250 mL via INTRAVENOUS
  Filled 2023-12-16: qty 500

## 2023-12-16 MED ORDER — STROKE: EARLY STAGES OF RECOVERY BOOK
Freq: Once | Status: AC
  Administered 2023-12-17: 1

## 2023-12-16 MED ORDER — LABETALOL HCL 5 MG/ML IV SOLN
10.0000 mg | INTRAVENOUS | Status: DC | PRN
Start: 2023-12-16 — End: 2023-12-20
  Administered 2023-12-17: 20 mg via INTRAVENOUS
  Filled 2023-12-16: qty 4

## 2023-12-16 MED ORDER — SODIUM CHLORIDE 0.9% IV SOLUTION: Freq: Once | INTRAVENOUS | Status: DC

## 2023-12-16 MED ORDER — FAMOTIDINE 20 MG PO TABS
20.0000 mg | ORAL_TABLET | Freq: Two times a day (BID) | ORAL | Status: DC
  Administered 2023-12-16 (×2): 20 mg
  Filled 2023-12-16 (×2): qty 1

## 2023-12-16 MED ORDER — ONDANSETRON HCL 4 MG/2ML IJ SOLN: 4.0000 mg | INTRAMUSCULAR | Status: DC | PRN

## 2023-12-16 MED ORDER — FLEET ENEMA RE ENEM: 1.0000 | ENEMA | Freq: Once | RECTAL | Status: DC | PRN

## 2023-12-16 MED ORDER — ACETAMINOPHEN 325 MG PO TABS: 650.0000 mg | ORAL_TABLET | ORAL | Status: DC | PRN

## 2023-12-16 MED ORDER — CHLORHEXIDINE GLUCONATE CLOTH 2 % EX PADS
6.0000 | MEDICATED_PAD | Freq: Every day | CUTANEOUS | Status: DC
  Administered 2023-12-16 – 2023-12-20 (×6): 6 via TOPICAL

## 2023-12-16 MED ORDER — BUPIVACAINE HCL 0.5 % IJ SOLN
INTRAMUSCULAR | Status: DC | PRN
  Administered 2023-12-16: 5 mL

## 2023-12-16 MED ORDER — SODIUM CHLORIDE 0.9 % IV SOLN: INTRAVENOUS | Status: DC | PRN

## 2023-12-16 MED ORDER — NOREPINEPHRINE 4 MG/250ML-% IV SOLN
2.0000 ug/min | INTRAVENOUS | Status: DC
  Administered 2023-12-16 (×2): 2 ug/min via INTRAVENOUS
  Filled 2023-12-16: qty 250

## 2023-12-16 MED ORDER — ORAL CARE MOUTH RINSE
15.0000 mL | OROMUCOSAL | Status: DC | PRN
Start: 2023-12-16 — End: 2023-12-17

## 2023-12-16 MED ORDER — ORAL CARE MOUTH RINSE
15.0000 mL | OROMUCOSAL | Status: DC
  Administered 2023-12-16 – 2023-12-17 (×13): 15 mL via OROMUCOSAL

## 2023-12-16 MED ORDER — PROMETHAZINE HCL 25 MG PO TABS: 12.5000 mg | ORAL_TABLET | ORAL | Status: DC | PRN

## 2023-12-16 MED ORDER — FENTANYL 2500MCG IN NS 250ML (10MCG/ML) PREMIX INFUSION
50.0000 ug/h | INTRAVENOUS | Status: DC
  Administered 2023-12-16 – 2023-12-17 (×4): 50 ug/h via INTRAVENOUS
  Filled 2023-12-16 (×3): qty 250

## 2023-12-16 MED ORDER — HEMOSTATIC AGENTS (NO CHARGE) OPTIME
TOPICAL | Status: DC | PRN
  Administered 2023-12-16: 1 via TOPICAL

## 2023-12-16 MED ORDER — BACITRACIN 500 UNIT/GM EX OINT
TOPICAL_OINTMENT | CUTANEOUS | Status: DC | PRN
  Administered 2023-12-16: 1 via TOPICAL

## 2023-12-16 MED ORDER — THROMBIN 20000 UNITS EX SOLR
CUTANEOUS | Status: AC
  Filled 2023-12-16: qty 20000

## 2023-12-16 MED ORDER — ACETAMINOPHEN 650 MG RE SUPP
650.0000 mg | RECTAL | Status: DC | PRN
Start: 2023-12-16 — End: 2023-12-17

## 2023-12-16 MED ORDER — DEXAMETHASONE SODIUM PHOSPHATE 10 MG/ML IJ SOLN
INTRAMUSCULAR | Status: DC | PRN
  Administered 2023-12-16: 10 mg via INTRAVENOUS

## 2023-12-16 MED ORDER — LIDOCAINE-EPINEPHRINE 1 %-1:100000 IJ SOLN
INTRAMUSCULAR | Status: DC | PRN
  Administered 2023-12-16: 5 mL

## 2023-12-16 MED ORDER — LEVETIRACETAM IN NACL 500 MG/100ML IV SOLN
500.0000 mg | Freq: Two times a day (BID) | INTRAVENOUS | Status: DC
  Administered 2023-12-16 – 2023-12-18 (×5): 500 mg via INTRAVENOUS
  Filled 2023-12-16 (×5): qty 100

## 2023-12-16 MED ORDER — DOCUSATE SODIUM 100 MG PO CAPS: 100.0000 mg | ORAL_CAPSULE | Freq: Two times a day (BID) | ORAL | Status: DC

## 2023-12-16 MED ORDER — FENTANYL CITRATE PF 50 MCG/ML IJ SOSY: 50.0000 ug | PREFILLED_SYRINGE | Freq: Once | INTRAMUSCULAR | Status: DC

## 2023-12-16 MED ORDER — MAGNESIUM SULFATE 2 GM/50ML IV SOLN
2.0000 g | Freq: Once | INTRAVENOUS | Status: AC
  Administered 2023-12-16: 2 g via INTRAVENOUS
  Filled 2023-12-16: qty 50

## 2023-12-16 MED ORDER — BISACODYL 10 MG RE SUPP: 10.0000 mg | Freq: Every day | RECTAL | Status: DC | PRN

## 2023-12-16 MED ORDER — DOCUSATE SODIUM 50 MG/5ML PO LIQD: 100.0000 mg | Freq: Two times a day (BID) | ORAL | Status: DC

## 2023-12-16 MED ORDER — ACETAMINOPHEN 160 MG/5ML PO SOLN
650.0000 mg | ORAL | Status: DC | PRN
Start: 2023-12-16 — End: 2023-12-17
  Administered 2023-12-16 – 2023-12-17 (×2): 650 mg
  Filled 2023-12-16 (×3): qty 20.3

## 2023-12-16 MED ORDER — CLEVIDIPINE BUTYRATE 0.5 MG/ML IV EMUL
INTRAVENOUS | Status: AC
  Filled 2023-12-16: qty 100

## 2023-12-16 MED ORDER — THROMBIN 20000 UNITS EX SOLR: CUTANEOUS | Status: DC | PRN

## 2023-12-16 MED ORDER — SENNA 8.6 MG PO TABS: 1.0000 | ORAL_TABLET | Freq: Two times a day (BID) | ORAL | Status: DC

## 2023-12-16 MED ORDER — THROMBIN 5000 UNITS EX SOLR: OROMUCOSAL | Status: DC | PRN

## 2023-12-16 MED ORDER — PANTOPRAZOLE SODIUM 40 MG IV SOLR: 40.0000 mg | Freq: Every day | INTRAVENOUS | Status: DC

## 2023-12-16 MED ORDER — PROPOFOL 1000 MG/100ML IV EMUL
5.0000 ug/kg/min | INTRAVENOUS | Status: DC
  Administered 2023-12-16 (×2): 30 ug/kg/min via INTRAVENOUS
  Administered 2023-12-16: 50 ug/kg/min via INTRAVENOUS
  Administered 2023-12-16 – 2023-12-17 (×3): 30 ug/kg/min via INTRAVENOUS
  Administered 2023-12-17: 40 ug/kg/min via INTRAVENOUS
  Filled 2023-12-16 (×6): qty 100

## 2023-12-16 MED ORDER — LIDOCAINE-EPINEPHRINE 1 %-1:100000 IJ SOLN
INTRAMUSCULAR | Status: AC
  Filled 2023-12-16: qty 1

## 2023-12-16 MED ORDER — SODIUM CHLORIDE 3 % IV SOLN
INTRAVENOUS | Status: DC
  Filled 2023-12-16 (×11): qty 500

## 2023-12-16 MED ORDER — ONDANSETRON HCL 4 MG PO TABS: 4.0000 mg | ORAL_TABLET | ORAL | Status: DC | PRN

## 2023-12-16 SURGICAL SUPPLY — 49 items
BAG COUNTER SPONGE SURGICOUNT (BAG) ×1 IMPLANT
BENZOIN TINCTURE PRP APPL 2/3 (GAUZE/BANDAGES/DRESSINGS) IMPLANT
BLADE CLIPPER SURG (BLADE) ×2 IMPLANT
BUR CUTTER 7.0 ROUND (BURR) ×1 IMPLANT
BUR MATCHSTICK NEURO 3.0 LAGG (BURR) IMPLANT
CANISTER SUCT 3000ML PPV (MISCELLANEOUS) ×1 IMPLANT
DERMABOND ADVANCED .7 DNX12 (GAUZE/BANDAGES/DRESSINGS) IMPLANT
DRAPE LAPAROTOMY 100X72 PEDS (DRAPES) ×1 IMPLANT
DRAPE WARM FLUID 44X44 (DRAPES) ×1 IMPLANT
DRSG OPSITE POSTOP 4X8 (GAUZE/BANDAGES/DRESSINGS) IMPLANT
ELECT CAUTERY BLADE 6.4 (BLADE) ×1 IMPLANT
ELECT REM PT RETURN 9FT ADLT (ELECTROSURGICAL) ×1 IMPLANT
ELECTRODE REM PT RTRN 9FT ADLT (ELECTROSURGICAL) ×1 IMPLANT
EVACUATOR 1/8 PVC DRAIN (DRAIN) IMPLANT
EVACUATOR SILICONE 100CC (DRAIN) IMPLANT
FORCEPS BIPOLAR SPETZLER 8 1.0 (NEUROSURGERY SUPPLIES) IMPLANT
GAUZE 4X4 16PLY ~~LOC~~+RFID DBL (SPONGE) IMPLANT
GAUZE SPONGE 4X4 12PLY STRL (GAUZE/BANDAGES/DRESSINGS) IMPLANT
GLOVE BIOGEL PI IND STRL 8.5 (GLOVE) ×1 IMPLANT
GLOVE ECLIPSE 8.5 STRL (GLOVE) ×1 IMPLANT
GOWN STRL REUS W/ TWL LRG LVL3 (GOWN DISPOSABLE) ×1 IMPLANT
GOWN STRL REUS W/ TWL XL LVL3 (GOWN DISPOSABLE) IMPLANT
GOWN STRL REUS W/TWL 2XL LVL3 (GOWN DISPOSABLE) ×1 IMPLANT
GRAFT DURAGEN MATRIX 2WX2L IMPLANT
HEMOSTAT POWDER KIT SURGIFOAM (HEMOSTASIS) IMPLANT
HEMOSTAT SURGICEL 2X14 (HEMOSTASIS) ×1 IMPLANT
KIT BASIN OR (CUSTOM PROCEDURE TRAY) ×1 IMPLANT
KIT TURNOVER KIT B (KITS) ×1 IMPLANT
NDL HYPO 22X1.5 SAFETY MO (MISCELLANEOUS) ×1 IMPLANT
NEEDLE HYPO 22X1.5 SAFETY MO (MISCELLANEOUS) ×1 IMPLANT
NS IRRIG 1000ML POUR BTL (IV SOLUTION) ×1 IMPLANT
PACK LAMINECTOMY NEURO (CUSTOM PROCEDURE TRAY) ×1 IMPLANT
PAD ARMBOARD 7.5X6 YLW CONV (MISCELLANEOUS) ×3 IMPLANT
PATTIES SURGICAL 1/4 X 3 (GAUZE/BANDAGES/DRESSINGS) IMPLANT
SPONGE SURGIFOAM ABS GEL 100 (HEMOSTASIS) IMPLANT
SPONGE T-LAP 4X18 ~~LOC~~+RFID (SPONGE) IMPLANT
STAPLER SKIN PROX WIDE 3.9 (STAPLE) IMPLANT
STRIP CLOSURE SKIN 1/4X4 (GAUZE/BANDAGES/DRESSINGS) IMPLANT
SUT ETHILON 3 0 FSL (SUTURE) ×1 IMPLANT
SUT NURALON 4 0 TR CR/8 (SUTURE) ×2 IMPLANT
SUT PROLENE 6 0 BV (SUTURE) ×1 IMPLANT
SUT VIC AB 1 CT1 18XBRD ANBCTR (SUTURE) ×1 IMPLANT
SUT VIC AB 2-0 CP2 18 (SUTURE) ×1 IMPLANT
SUT VIC AB 3-0 SH 8-18 (SUTURE) ×1 IMPLANT
SUT VIC AB 4-0 RB1 18 (SUTURE) ×1 IMPLANT
TOWEL GREEN STERILE (TOWEL DISPOSABLE) ×1 IMPLANT
TOWEL GREEN STERILE FF (TOWEL DISPOSABLE) IMPLANT
UNDERPAD 30X36 HEAVY ABSORB (UNDERPADS AND DIAPERS) IMPLANT
WATER STERILE IRR 1000ML POUR (IV SOLUTION) ×1 IMPLANT

## 2023-12-16 NOTE — Code Documentation (Addendum)
 Responded to Code Stroke called at 0122 for L sided weakness, N/V, LSN-2300. 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 Mass effect with narrowed fourth ventricle, but no hydrocephalus at this time. Basal cisterns are effaced and there isearly ascending transtentorial herniation. TNK not given-bleed. Pt taken back to ED room, intubated, and Neurosurgery consulted. Plan OR and ICU admission.

## 2023-12-16 NOTE — Progress Notes (Signed)
 Patient ID: Wayne Lee, male   DOB: 1975/12/29, 48 y.o.   MRN: [id number] Vital signs are stable Review of new postop CT demonstrates good decompression some slight amounts of residual clot.  The fourth ventricle appears open.  No hydrocephalus.  Acceptable postoperative result for now.  Continue ventilatory support and wean as tolerated.

## 2023-12-16 NOTE — Consult Note (Signed)
 NAME:  Wayne Lee, MRN:  [id number], DOB:  [birth year], LOS: 0 ADMISSION DATE:  12/16/2023, CONSULTATION DATE:  12/15/22 REFERRING MD:  Dr. Colon, CHIEF COMPLAINT:  postop   History of Present Illness:  Pt encephalopathic, therefore HPI obtained from medical chart review.   76yoM with PMH of HLD, BMI 38, and possible OSA who presented after developing headache, dizziness, 2 episodes of emesis, and left sided weakness, LSW 2300.  Code stroke on arrival.  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  CTA head/ neck negative for LVO, moderate right P2 PCA stenosis, no aneurysm or vascular malformation identified but limited assessment due to arterial timing.  NSGY consulted. Given prior vomiting episodes and decreased mental status, pt intubated for airway protection.  SBP's 140-220 in ER.  Pt taken emergently to OR by Dr. Colon for suboccipital craniectomy with decompression of cerebellar hemorrhage.  Pt returns to ICU sedated on mechanical ventilation.  PCCM consulted for medical and ventilator management in ICU.   Pertinent  Medical History  Obesity, HLD, ?OSA No tobacco, ETOH or reported drug use  Significant Hospital Events: Including procedures, antibiotic start and stop dates in addition to other pertinent events   1/6 Admit, L cerebellar ICH w/ IVH,   Interim History / Subjective:    Objective   Blood pressure (!) 140/98, pulse 77, temperature (!) 97.5 F (36.4 C), temperature source Axillary, resp. rate 19, height 5' 8 (1.727 m), weight 113.6 kg, SpO2 100%.    Vent Mode: PRVC FiO2 (%):  [100 %] 100 % Set Rate:  [22 bmp] 22 bmp Vt Set:  [550 mL] 550 mL PEEP:  [5 cmH20] 5 cmH20   Intake/Output Summary (Last 24 hours) at 12/16/2023 0647 Last data filed at 12/16/2023 9365 Gross per 24 hour  Intake 1256.08 ml   Output 1100 ml  Net 156.08 ml   Filed Weights   12/16/23 0148  Weight: 113.6 kg   Examination: Fent 100, propofol  50  General:  critically ill adult male lying in bed in NAD HEENT: MM pink/moist, ETT/ OGT- bilious, pupils 2/r, absent corneal's, posterior/ occipital dressing dry Neuro: sedated CV: rr, NSR, no murmur, R internal jugular DL CVL, left radial aline PULM:  non labored, MV supported, clear, no wheeze, no secretions, no cough GI: soft, bsx4 active  Extremities: warm/dry, no LE edema  Skin: no rashes   Resolved Hospital Problem list    Assessment & Plan:   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, suspected due to underlying hypertension P - per Neurology and NSGY - SBP goal 130-150, prn cleviprex  or NE - f/u CTH at 1400, MRI at some point - HTS per Neurology, 36ml/hr, trend Na q6hrs, goal 150-155 - empiric keppra  per NSGY - complete post op abx - serial neuro exams - neuro protective measures  - further stroke workup per Neurology   Acute respiratory insufficiency Possible aspiration PNA - given multiple emesis/ AMS - full MV support, 4-8cc/kg IBW with goal Pplat <30 and DP<15  - VAP prevention protocol/ PPI - PAD protocol for sedation> propofol / fentanyl  w/ bowel regimen - CXR/ ABG - wean FiO2 as able for SpO2 >92%  - daily SAT & SBT when appropriate per NSGY - monitor clinically, hold on empiric abx for now.  WBC/ differential normal, afebrile, no secretions    Hypokalemia -  repeat BMET now, check Mag, goal K > 4, Mag> 2   HLD - lipid panel   Elevated AST/ ALT - recheck in am   Hyperglycemia - ?stress response, will check A1c - CBG q4/ SSI   Obesity, BMI 38 - outpt f/u, education when appropriate    Best Practice (right click and Reselect all SmartList Selections daily)   Diet/type: NPO DVT prophylaxis SCD Pressure ulcer(s): N/A GI prophylaxis: H2B Lines: Central line, Arterial Line, and yes and it is  still needed Foley:  Yes, and it is still needed Code Status:  full code Last date of multidisciplinary goals of care discussion [pending]  No family at bedside, pending 1/6 am.   Labs   CBC: Recent Labs  Lab 12/16/23 0136 12/16/23 0141  WBC 9.3  --   NEUTROABS 1.8  --   HGB 14.7 15.6  HCT 46.0 46.0  MCV 86.1  --   PLT 297  --     Basic Metabolic Panel: Recent Labs  Lab 12/16/23 0136 12/16/23 0141 12/16/23 0145  NA 138 141 137  K 3.1* 3.2*  --   CL 102 104  --   CO2 22  --   --   GLUCOSE 160* 160*  --   BUN 14 17  --   CREATININE 1.18 1.20  --   CALCIUM  9.5  --   --    GFR: Estimated Creatinine Clearance: 94.1 mL/min (by C-G formula based on SCr of 1.2 mg/dL). Recent Labs  Lab 12/16/23 0136  WBC 9.3    Liver Function Tests: Recent Labs  Lab 12/16/23 0136  AST 51*  ALT 76*  ALKPHOS 98  BILITOT 0.7  PROT 7.4  ALBUMIN  4.0   No results for input(s): LIPASE, AMYLASE in the last 168 hours. No results for input(s): AMMONIA in the last 168 hours.  ABG    Component Value Date/Time   TCO2 25 12/16/2023 0141     Coagulation Profile: Recent Labs  Lab 12/16/23 0136  INR 1.1    Cardiac Enzymes: No results for input(s): CKTOTAL, CKMB, CKMBINDEX, TROPONINI in the last 168 hours.  HbA1C: No results found for: HGBA1C  CBG: Recent Labs  Lab 12/16/23 0135 12/16/23 0155  GLUCAP 144* 132*    Review of Systems:   unable  Past Medical History:  He,  has no past medical history on file.   Surgical History:  History reviewed. No pertinent surgical history.   Social History:      Family History:  His family history is not on file.   Allergies Allergies  Allergen Reactions   Penicillins Hives and Swelling     Home Medications  Prior to Admission medications   Medication Sig Start Date End Date Taking? Authorizing Provider  cyclobenzaprine  (FLEXERIL ) 10 MG tablet Take 1 tablet (10 mg total) by mouth 3 (three) times daily  as needed for muscle spasms. 07/27/21   Vicky Charleston, PA-C  ibuprofen  (ADVIL ) 800 MG tablet Take 1 tablet (800 mg total) by mouth 3 (three) times daily. 07/27/21   Vicky Charleston, PA-C     Critical care time: 38 mins       Lyle Pesa, MSN, AG-ACNP-BC  Pulmonary & Critical Care 12/16/2023, 7:52 AM  See Amion for pager If no response to pager , please call [phone] until 7pm After 7:00 pm call Elink  [phone]

## 2023-12-16 NOTE — Transfer of Care (Signed)
 Immediate Anesthesia Transfer of Care Note  Patient: Wayne Lee  Procedure(s) Performed: SUBOCCIPITAL CRANIECTOMY (Head)  Patient Location: ICU  Anesthesia Type:General  Level of Consciousness: Patient remains intubated per anesthesia plan  Airway & Oxygen Therapy: Patient remains intubated per anesthesia plan and Patient placed on Ventilator (see vital sign flow sheet for setting)  Post-op Assessment: Report given to RN and Post -op Vital signs reviewed and stable  Post vital signs: Reviewed and stable  Last Vitals:  Vitals Value Taken Time  BP 124/85 12/16/23 0645  Temp    Pulse 79 12/16/23 0651  Resp 22 12/16/23 0651  SpO2 100 % 12/16/23 0651  Vitals shown include unfiled device data.  Last Pain:  Vitals:   12/16/23 0254  TempSrc: Axillary         Complications: No notable events documented.

## 2023-12-16 NOTE — Anesthesia Postprocedure Evaluation (Signed)
 Anesthesia Post Note  Patient: Wayne Lee  Procedure(s) Performed: SUBOCCIPITAL CRANIECTOMY (Head)     Patient location during evaluation: SICU Anesthesia Type: General Level of consciousness: sedated and patient remains intubated per anesthesia plan Pain management: pain level controlled Vital Signs Assessment: post-procedure vital signs reviewed and stable Respiratory status: patient on ventilator - see flowsheet for VS and patient remains intubated per anesthesia plan Cardiovascular status: stable Anesthetic complications: no  No notable events documented.  Last Vitals:  Vitals:   12/16/23 2030 12/16/23 2045  BP: (!) 152/84 (!) 146/79  Pulse: (!) 104 (!) 108  Resp: (!) 22 (!) 22  Temp: (!) 38.2 C (!) 38.2 C  SpO2: 99% 98%    Last Pain:  Vitals:   12/16/23 1701  TempSrc:   PainSc: 0-No pain                 Norleen Pope

## 2023-12-16 NOTE — Progress Notes (Addendum)
 STROKE TEAM PROGRESS NOTE   BRIEF HPI [name] is a 48 y.o. male with history of obesity, sleep apnea, and HLD presenting with headache and dizziness. BP on arrival 204/106. He was taken emergently to the OR for hematoma evacuation and hypertonic saline was started. Remains intubated post procedure.   NIH on Admission 9   SIGNIFICANT HOSPITAL EVENTS 1/6- Admitted to 4N, s/p suboccipital craniotomy   INTERIM HISTORY/SUBJECTIVE CT scan today at 4 Wife and brother at the bedside.  Opens eyes with noxious stimuli, not currently following commands.  Has sedation running with propofol  and fentanyl .  Is requiring minimal blood pressure support with vasopressors.  Hypertonic saline continues at 75 mL/hr   OBJECTIVE  CBC    Component Value Date/Time   WBC 4.8 12/16/2023 0718   RBC 4.34 12/16/2023 0718   HGB 11.9 (L) 12/16/2023 0748   HCT 35.0 (L) 12/16/2023 0748   PLT 226 12/16/2023 0718   MCV 83.6 12/16/2023 0718   MCH 27.4 12/16/2023 0718   MCHC 32.8 12/16/2023 0718   RDW 13.2 12/16/2023 0718   LYMPHSABS 5.7 (H) 12/16/2023 0136   MONOABS 1.2 (H) 12/16/2023 0136   EOSABS 0.6 (H) 12/16/2023 0136   BASOSABS 0.0 12/16/2023 0136    BMET    Component Value Date/Time   NA 143 12/16/2023 0748   K 4.0 12/16/2023 0748   CL 110 12/16/2023 0718   CO2 20 (L) 12/16/2023 0718   GLUCOSE 140 (H) 12/16/2023 0718   BUN 11 12/16/2023 0718   CREATININE 1.01 12/16/2023 0718   CALCIUM  8.6 (L) 12/16/2023 0718   GFRNONAA >60 12/16/2023 0718    IMAGING past 24 hours DG Chest Port 1 View Result Date: 12/16/2023 CLINICAL DATA:  Ventilator dependence. EXAM: PORTABLE CHEST 1 VIEW COMPARISON:  Earlier same day FINDINGS: Endotracheal tube tip is 2.7 cm above the base of the carina. The NG tube passes into the stomach although the distal tip position is not included on the film. Right IJ central line tip overlies the mid SVC. Low lung volumes, similar to prior. There is pulmonary vascular  congestion without overt pulmonary edema. No evidence for pneumothorax. No substantial pleural effusion. Cardiopericardial silhouette is at upper limits of normal for size. Telemetry leads overlie the chest. IMPRESSION: Low lung volumes with pulmonary vascular congestion. NG tube enters the stomach although distal tip has not been included on the film. Electronically Signed   By: Camellia Candle M.D.   On: 12/16/2023 07:21   CT ANGIO HEAD NECK W WO CM (CODE STROKE) Result Date: 12/16/2023 CLINICAL DATA:  Neuro deficit, acute, stroke suspected Please also do 5 min delay if it will not delay OR EXAM: CT ANGIOGRAPHY HEAD AND NECK WITH AND WITHOUT CONTRAST TECHNIQUE: Multidetector CT imaging of the head and neck was performed using the standard protocol during bolus administration of intravenous contrast. Multiplanar CT image reconstructions and MIPs were obtained to evaluate the vascular anatomy. Carotid stenosis measurements (when applicable) are obtained utilizing NASCET criteria, using the distal internal carotid diameter as the denominator. RADIATION DOSE REDUCTION: This exam was performed according to the departmental dose-optimization program which includes automated exposure control, adjustment of the mA and/or kV according to patient size and/or use of iterative reconstruction technique. CONTRAST:  75mL OMNIPAQUE  IOHEXOL  350 MG/ML SOLN COMPARISON:  Same day CT head. FINDINGS: CTA NECK FINDINGS Aortic arch: The vessel origins are patent. Right carotid system: No evidence of dissection, stenosis (50% or greater), or occlusion. Left carotid system: No evidence  of dissection, stenosis (50% or greater), or occlusion. Vertebral arteries: Right dominant. No evidence of dissection, stenosis (50% or greater), or occlusion. Skeleton: No acute abnormality on limited assessment. Other neck: No acute abnormality on limited assessment. Upper chest: Visualized lung apices are clear. Review of the MIP images confirms the above  findings CTA HEAD FINDINGS Anterior circulation: Bilateral intracranial ICAs, MCAs, and ACAs are patent without proximal hemodynamically significant stenosis. No aneurysm identified. Posterior circulation: Bilateral intradural vertebral arteries, basilar artery and bilateral posterior cerebral are patent. Severe right P2 PCA stenosis mild left P2 PCA stenosis. No aneurysm or arteriovenous malformation identified; however, acute blood products limits assessment. Venous sinuses: As permitted by contrast timing, patent. Limited assessment due to arterial timing. Review of the MIP images confirms the above findings IMPRESSION: 1. No emergent large vessel occlusion. 2. Moderate right P2 PCA stenosis. 3. No aneurysm or vascular malformation identified; however, acute blood products limits assessment. Electronically Signed   By: Gilmore GORMAN Molt M.D.   On: 12/16/2023 03:46   DG Chest Portable 1 View Result Date: 12/16/2023 CLINICAL DATA:  Tube placement EXAM: PORTABLE CHEST 1 VIEW COMPARISON:  None available FINDINGS: Endotracheal tube is 1.3 cm above the carina. NG tube tip is in the mid to distal esophagus. Low lung volumes. No confluent opacities or effusions. Heart is normal size. IMPRESSION: NG tube tip is in the mid to distal esophagus and should be advanced 10-15 cm. Low lung volumes. Electronically Signed   By: Franky Crease M.D.   On: 12/16/2023 03:13   CT HEAD CODE STROKE WO CONTRAST Result Date: 12/16/2023 CLINICAL DATA:  Code stroke. EXAM: CT HEAD WITHOUT CONTRAST TECHNIQUE: Contiguous axial images were obtained from the base of the skull through the vertex without intravenous contrast. RADIATION DOSE REDUCTION: This exam was performed according to the departmental dose-optimization program which includes automated exposure control, adjustment of the mA and/or kV according to patient size and/or use of iterative reconstruction technique. COMPARISON:  CT head 07/26/2021. FINDINGS: Brain: Acute 5.3 x 3.8 x  2.3 cm (estimated volume of 23 mL) intraparenchymal hemorrhage centered in the left cerebellum. The hemorrhage approaches the fourth ventricle without definite intraventricular extension. Significant surrounding edema with mass effect and marked effacement of the fourth ventricle. No hydrocephalus at this time. Basal cistern effacement and early ascending transtentorial herniation. No evidence of acute large vascular territory infarct or visible mass lesion. Vascular: No hyperdense vessel. Skull: No acute fracture. Sinuses/Orbits: Mild paranasal sinus mucosal thickening. No acute orbital findings. Other: No mastoid effusions. ASPECTS [facility] Stroke Program Early CT Score) total score (0-10 with 10 being normal): 10. IMPRESSION: Large acute 5.3 cm intraparenchymal hemorrhage in the left cerebellum. Mass effect with narrowed fourth ventricle, but no hydrocephalus at this time. Basal cisterns are effaced and there is early ascending transtentorial herniation. Critical code stroke imaging results were communicated on 12/16/2023 at 1:45 am to provider Bhagat via secure text paging. Electronically Signed   By: Gilmore GORMAN Molt M.D.   On: 12/16/2023 01:53    Vitals:   12/16/23 0300 12/16/23 0305 12/16/23 0644 12/16/23 0737  BP: (!) 125/90 (!) 140/98  136/65  Pulse: 82 77  68  Resp: 20 19  (!) 22  Temp:   (!) 95.9 F (35.5 C) (!) 96.1 F (35.6 C)  TempSrc:   Bladder   SpO2: 100% 100%  100%  Weight:      Height:         PHYSICAL EXAM General:  Alert, well-nourished, well-developed patient  in no acute distress Psych:  Mood and affect appropriate for situation CV: Regular rate and rhythm on monitor Respiratory:  Regular, unlabored respirations on room air GI: Abdomen soft and nontender   NEURO:  Mental Status: Opens eyes with noxious stimuli.  Not following commands  Speech/Language:   Cranial Nerves:  II: PERRL. Eye midline with forced eye opening III, IV, VI:  V: Sensation is intact to light  touch and symmetrical to face.  VII: Face is symmetrical resting and smiling VIII: hearing intact to voice. IX, X: Cough and gag intact KP:Yzji is midline XII:  Motor: Moves all extremities antigravity, upper extremities more than lower Tone: is normal and bulk is normal Sensation- Withdraws to painful stimuli Coordination:  Gait- deferred  ASSESSMENT/PLAN  Large cerebellar hemorrhage s/p craniectomy with hematoma evacuation Etiology: Likely hypertensive Code Stroke CT head - Large acute 5.3 cm intraparenchymal hemorrhage in the left cerebellum. Mass effect with narrowed fourth ventricle, but no hydrocephalus at this time. Basal cisterns are effaced and there is early ascending transtentorial herniation. CTA head & neck moderate right P2 stenosis CT head repeat pending MRI w/wo pending in am 2D Echo pending LDL pending HgbA1c pending UDS pending VTE prophylaxis -SCDs No antithrombotic prior to admission, now on No antithrombotic in the setting of IPH Therapy recommendations:  Pending Disposition:  Remain in Neuro ICU  Cerebellar edema S/p craniectomy for hematoma evacuation Hypertonic saline at 55ml/hr Na 141 -> 143 Q6 Na CT repeat pending monitor for hydrocephalus  Acute respiratory insufficiency Intubated peri prodecure   CCM to manage vent Sedated with propofol , fentanyl  VAT prevention Currently on full support, wean per CCM  Hypertension Home meds: None Currently requiring vasopressor support, likely due to sedation BP elevated on arrival with systolic in the 200s  Hyperlipidemia  Not on home meds yet Had recent dietitian appointment LDL pending, goal < 70 Consider statin if needed.  Dysphagia On vent  With OG Consider tube feeding once appropriate  Other Stroke Risk Factors Obesity, Body mass index is 38.08 kg/m., BMI >/= 30 associated with increased stroke risk, recommend weight loss, diet and exercise as appropriate OSA   Other Active  Problems Hypokalemia 3.2 -> 4.4 Elevated liver enzymes AST/ALT - 51/76 Repeat in the morning   Hospital day # 0  Patient seen and examined by NP/APP with MD. MD to update note as needed.   Jorene Last, DNP, FNP-BC [facility] Pager: [phone]  ATTENDING NOTE: I reviewed above note and agree with the assessment and plan. Pt was seen and examined.   Wife and brother are at the bedside. Pt sedated on vent, slightly open eyes on voice, then agitated with pain stimulation, not following commands. With forced eye opening, eyes in mid position, not blinking to visual threat, doll's eyes absent, not tracking, pupils 2mm sluggish to light. Corneal reflex absent b/l, gag and cough present. Breathing over the vent.  Facial symmetry not able to test due to ET tube.  Tongue protrusion not cooperative. On pain stimulation, raised BUE against gravity, and moved b/l toes but no leg movement. Sensation, coordination and gait not tested.  Pt had large left cerebellar ICH s/p hematoma evacuation. Still has high risk of cerebellar edema with brainstem compression and transtentorial herniation as well as hydrocephalus, will continue 3% saline. Repeat CT this afternoon and MRI in am. Na goal 150-155. Vent management per CCM. BP goal < 160. Appreciate NSG and CCM taking care of this pt.  For detailed assessment and  plan, please refer to above/below as I have made changes wherever appropriate.   Ary Cummins, MD PhD Stroke Neurology 12/16/2023 4:27 PM  This patient is critically ill due to large cerebellar ICH, s/p evacuation, respiratory failure and at significant risk of neurological worsening, death form cerebral edema, brain herniation, hydrocephalus. This patient's care requires constant monitoring of vital signs, hemodynamics, respiratory and cardiac monitoring, review of multiple databases, neurological assessment, discussion with family, other specialists and medical decision making of  high complexity. I spent 40 minutes of neurocritical care time in the care of this patient. I had long discussion with wife and brother at bedside, updated pt current condition, treatment plan and potential prognosis, and answered all the questions. They expressed understanding and appreciation. I also discussed with Dr. Harold CCM     To contact Stroke Continuity provider, please refer to [email or web address]. After hours, contact General Neurology

## 2023-12-16 NOTE — Anesthesia Preprocedure Evaluation (Addendum)
 Anesthesia Evaluation  Patient identified by MRN, date of birth, ID band Patient unresponsive    Reviewed: Allergy & Precautions, Patient's Chart, lab work & pertinent test results, Unable to perform ROS - Chart review onlyPreop documentation limited or incomplete due to emergent nature of procedure.  Airway Mallampati: Intubated       Dental   Pulmonary neg pulmonary ROS   breath sounds clear to auscultation   + intubated    Cardiovascular negative cardio ROS  Rhythm:Regular Rate:Normal     Neuro/Psych negative neurological ROS     GI/Hepatic negative GI ROS, Neg liver ROS,,,  Endo/Other  negative endocrine ROS    Renal/GU negative Renal ROS     Musculoskeletal negative musculoskeletal ROS (+)    Abdominal  (+) + obese  Peds  Hematology negative hematology ROS (+)   Anesthesia Other Findings   Reproductive/Obstetrics                             Anesthesia Physical Anesthesia Plan  ASA: 3 and emergent  Anesthesia Plan: General   Post-op Pain Management: Minimal or no pain anticipated   Induction:   PONV Risk Score and Plan: 2 and Ondansetron , Dexamethasone  and Treatment may vary due to age or medical condition  Airway Management Planned: Oral ETT  Additional Equipment: Arterial line  Intra-op Plan:   Post-operative Plan: Post-operative intubation/ventilation  Informed Consent: I have reviewed the patients History and Physical, chart, labs and discussed the procedure including the risks, benefits and alternatives for the proposed anesthesia with the patient or authorized representative who has indicated his/her understanding and acceptance.     Dental advisory given  Plan Discussed with: CRNA  Anesthesia Plan Comments: (2 x PIV)       Anesthesia Quick Evaluation

## 2023-12-16 NOTE — ED Triage Notes (Signed)
 Pt BIB [facility] from home for L sided weakness, nausea, vomiting, and a 10/10 headache.

## 2023-12-16 NOTE — Anesthesia Procedure Notes (Signed)
 Arterial Line Insertion Start/End1/05/2024 3:50 AM, 12/16/2023 4:00 AM Performed by: Navjot Pilgrim T, SCIENTIST, CLINICAL (HISTOCOMPATIBILITY AND IMMUNOGENETICS), CRNA  Patient location: OR. Emergency situation Patient sedated Left, radial was placed Catheter size: 20 G Hand hygiene performed  and maximum sterile barriers used   Attempts: 2 Procedure performed without using ultrasound guided technique. Following insertion, dressing applied and Biopatch. Post procedure assessment: normal  Patient tolerated the procedure well with no immediate complications.

## 2023-12-16 NOTE — H&P (Signed)
 Wayne Lee is an 48 y.o. male.   Chief Complaint: Cerebellar hemorrhage HPI: The patient is a 48 year old male who started feeling poorly around 11 PM with severe headache weakness and sluggishness.  He was brought to the emergency department where it was noted that he was quite lethargic a stat CT demonstrated the presence of a large cerebellar hemorrhage in the left hemisphere extending to the midline and intraventricularly.  A CT scan from 2 years ago was completely within the limits of normal this was done for the reason of trauma.  Because of his deteriorating status he was being debated when I was contacted around 2 AM.  I advised that based on the CT scan he will require surgical evacuation on the emergent basis.  The team has been called in.  A contrasted CT is to be performed.  My exam was performed at 3 AM.  I had talked to the family and described the gravity and severity of the situation.  He will be taken to the operating room as soon as the CT scan can be procured.  History reviewed. No pertinent past medical history.  History reviewed. No pertinent surgical history.  History reviewed. No pertinent family history. Social History:  has no history on file for tobacco use, alcohol  use, and drug use.  Allergies:  Allergies  Allergen Reactions   Penicillins Hives and Swelling    (Not in a hospital admission)   Results for orders placed or performed during the hospital encounter of 12/16/23 (from the past 48 hours)  CBG monitoring, ED     Status: Abnormal   Collection Time: 12/16/23  1:35 AM  Result Value Ref Range   Glucose-Capillary 144 (H) 70 - 99 mg/dL    Comment: Glucose reference range applies only to samples taken after fasting for at least 8 hours.  Protime-INR     Status: None   Collection Time: 12/16/23  1:36 AM  Result Value Ref Range   Prothrombin Time 14.1 11.4 - 15.2 seconds   INR 1.1 0.8 - 1.2    Comment: (NOTE) INR goal varies based on device and disease  states. Performed at [facility], [address], [place], [place] [address]   APTT     Status: Abnormal   Collection Time: 12/16/23  1:36 AM  Result Value Ref Range   aPTT 23 (L) 24 - 36 seconds    Comment: Performed at [facility], [address], [place], [place] [address]  CBC     Status: None   Collection Time: 12/16/23  1:36 AM  Result Value Ref Range   WBC 9.3 4.0 - 10.5 K/uL   RBC 5.34 4.22 - 5.81 MIL/uL   Hemoglobin 14.7 13.0 - 17.0 g/dL   HCT 53.9 60.9 - 47.9 %   MCV 86.1 80.0 - 100.0 fL   MCH 27.5 26.0 - 34.0 pg   MCHC 32.0 30.0 - 36.0 g/dL   RDW 86.7 88.4 - 84.4 %   Platelets 297 150 - 400 K/uL   nRBC 0.0 0.0 - 0.2 %    Comment: Performed at [facility], [address] [place], [place] [address]  Differential     Status: Abnormal   Collection Time: 12/16/23  1:36 AM  Result Value Ref Range   Neutrophils Relative % 20 %   Neutro Abs 1.8 1.7 - 7.7 K/uL   Lymphocytes Relative 61 %   Lymphs Abs 5.7 (H) 0.7 - 4.0 K/uL  Monocytes Relative 13 %   Monocytes Absolute 1.2 (H) 0.1 - 1.0 K/uL   Eosinophils Relative 6 %   Eosinophils Absolute 0.6 (H) 0.0 - 0.5 K/uL   Basophils Relative 0 %   Basophils Absolute 0.0 0.0 - 0.1 K/uL   Immature Granulocytes 0 %   Abs Immature Granulocytes 0.01 0.00 - 0.07 K/uL    Comment: Performed at [facility], [address], [place], [place] [address]  Comprehensive metabolic panel     Status: Abnormal   Collection Time: 12/16/23  1:36 AM  Result Value Ref Range   Sodium 138 135 - 145 mmol/L   Potassium 3.1 (L) 3.5 - 5.1 mmol/L   Chloride 102 98 - 111 mmol/L   CO2 22 22 - 32 mmol/L   Glucose, Bld 160 (H) 70 - 99 mg/dL    Comment: Glucose reference range applies only to samples taken after fasting for at least 8 hours.   BUN 14 6 - 20 mg/dL   Creatinine, Ser 8.81 0.61 - 1.24 mg/dL   Calcium  9.5 8.9 - 10.3 mg/dL   Total Protein 7.4 6.5 - 8.1 g/dL   Albumin  4.0 3.5 - 5.0 g/dL   AST 51 (H) 15 - 41 U/L    ALT 76 (H) 0 - 44 U/L   Alkaline Phosphatase 98 38 - 126 U/L   Total Bilirubin 0.7 0.0 - 1.2 mg/dL   GFR, Estimated >39 >39 mL/min    Comment: (NOTE) Calculated using the CKD-EPI Creatinine Equation (2021)    Anion gap 14 5 - 15    Comment: Performed at [facility], [address], [place], [place] [address]  Ethanol     Status: None   Collection Time: 12/16/23  1:36 AM  Result Value Ref Range   Alcohol , Ethyl (B) <10 <10 mg/dL    Comment: (NOTE) Lowest detectable limit for serum alcohol  is 10 mg/dL.  For medical purposes only. Performed at [facility], [address], [place], [place] [address]   I-stat chem 8, ED     Status: Abnormal   Collection Time: 12/16/23  1:41 AM  Result Value Ref Range   Sodium 141 135 - 145 mmol/L   Potassium 3.2 (L) 3.5 - 5.1 mmol/L   Chloride 104 98 - 111 mmol/L   BUN 17 6 - 20 mg/dL   Creatinine, Ser 8.79 0.61 - 1.24 mg/dL   Glucose, Bld 839 (H) 70 - 99 mg/dL    Comment: Glucose reference range applies only to samples taken after fasting for at least 8 hours.   Calcium , Ion 1.17 1.15 - 1.40 mmol/L   TCO2 25 22 - 32 mmol/L   Hemoglobin 15.6 13.0 - 17.0 g/dL   HCT 53.9 60.9 - 47.9 %  Sodium     Status: None   Collection Time: 12/16/23  1:45 AM  Result Value Ref Range   Sodium 137 135 - 145 mmol/L    Comment: Performed at [facility], [address] [place], [place] [address]  CBG monitoring, ED     Status: Abnormal   Collection Time: 12/16/23  1:55 AM  Result Value Ref Range   Glucose-Capillary 132 (H) 70 - 99 mg/dL    Comment: Glucose reference range applies only to samples taken after fasting for at least 8 hours.   CT HEAD CODE STROKE WO CONTRAST Result Date: 12/16/2023 CLINICAL DATA:  Code stroke. EXAM: CT HEAD WITHOUT CONTRAST TECHNIQUE: Contiguous axial images were  obtained from the base of the skull through the vertex without intravenous contrast. RADIATION DOSE REDUCTION: This exam was performed according to the  departmental dose-optimization program which includes automated exposure control, adjustment of the mA and/or kV according to patient size and/or use of iterative reconstruction technique. COMPARISON:  CT head 07/26/2021. FINDINGS: Brain: Acute 5.3 x 3.8 x 2.3 cm (estimated volume of 23 mL) intraparenchymal hemorrhage centered in the left cerebellum. The hemorrhage approaches the fourth ventricle without definite intraventricular extension. Significant surrounding edema with mass effect and marked effacement of the fourth ventricle. No hydrocephalus at this time. Basal cistern effacement and early ascending transtentorial herniation. No evidence of acute large vascular territory infarct or visible mass lesion. Vascular: No hyperdense vessel. Skull: No acute fracture. Sinuses/Orbits: Mild paranasal sinus mucosal thickening. No acute orbital findings. Other: No mastoid effusions. ASPECTS [facility] Stroke Program Early CT Score) total score (0-10 with 10 being normal): 10. IMPRESSION: Large acute 5.3 cm intraparenchymal hemorrhage in the left cerebellum. Mass effect with narrowed fourth ventricle, but no hydrocephalus at this time. Basal cisterns are effaced and there is early ascending transtentorial herniation. Critical code stroke imaging results were communicated on 12/16/2023 at 1:45 am to provider Bhagat via secure text paging. Electronically Signed   By: Gilmore GORMAN Molt M.D.   On: 12/16/2023 01:53    Review of Systems  Unable to perform ROS: Acuity of condition    Blood pressure (!) 140/98, pulse 77, temperature (!) 97.5 F (36.4 C), temperature source Axillary, resp. rate 19, height 5' 8 (1.727 m), weight 113.6 kg, SpO2 100%. Physical Exam Constitutional:      Comments: Patient is comatose and intubated.  HENT:     Nose: Nose normal.     Mouth/Throat:     Mouth: Mucous membranes are moist.     Pharynx: Oropharynx is clear.  Neurological:     Comments: Patient is intubated.  To light pain he  has some purposeful movement with the left upper extremity.  Shrugs his right shoulder.  He has spontaneous Babinski reflexes in both lower extremities.  He has hypertonicity.  His pupils demonstrate that he has a right pupil of 3 mm and is briskly reactive left pupil is 2 mm and more sluggishly reactive.  There is a positive doll's maneuver.  He does appear to breathe above the vent and has some gag and cough.      Assessment/Plan Large cerebellar hemorrhage with brainstem compression.  Plan: Emergent craniectomy for evacuation of hematoma.  Victory JINNY Gens, MD 12/16/2023, 3:13 AM

## 2023-12-16 NOTE — H&P (Signed)
 NEUROLOGY H&P NOTE   Date of service: December 16, 2023 Patient Name: Wayne Lee MRN:  [id number] DOB:  10-01-1976 Chief Complaint: Headache, I think I'm having a stroke  History of Present Illness  Wayne Lee is a 48 y.o. man with past medical history significant for BMI 38, possible sleep apnea pending testing, recent diagnosis of hyperlipidemia not on medications, no known history of hypertension, no history of any substance use per family  He was in his usual state of health today when while climbing up the stairs about 11 PM he began to have a headache and feel dizzy.  He subsequently had emesis and told his wife he thought he might be having a stroke.  She activated 911, and EMS team activated code stroke, finding him to be weak on the left side  During transport he was noted to be having increasing lethargy, but was otherwise oriented  Last known well: 11 PM Modified rankin score: 0-Completely asymptomatic and back to baseline post- stroke ICH Score: 1 --infratentorial origin  tNKASE: Not offered due to ICH Thrombectomy: not offered due to ICH NIHSS components Score: Comment  1a Level of Conscious 0[]  1[x]  2[]  3[]      1b LOC Questions 0[x]  1[]  2[]       1c LOC Commands 0[x]  1[]  2[]       2 Best Gaze 0[x]  1[]  2[]       3 Visual 0[x]  1[]  2[]  3[]      4 Facial Palsy 0[x]  1[]  2[]  3[]      5a Motor Arm - left 0[]  1[x]  2[]  3[]  4[]  UN[]    5b Motor Arm - Right 0[x]  1[]  2[]  3[]  4[]  UN[]    6a Motor Leg - Left 0[]  1[]  2[]  3[x]  4[]  UN[]    6b Motor Leg - Right 0[]  1[]  2[x]  3[]  4[]  UN[]    7 Limb Ataxia 0[]  1[]  2[]  3[]  UN[x]     8 Sensory 0[x]  1[]  2[]  UN[]      9 Best Language 0[]  1[x]  2[]  3[]    Limited testing secondary to lethargy/somnolence  10 Dysarthria 0[]  1[x]  2[]  UN[]      11 Extinct. and Inattention 0[x]  1[]  2[]       TOTAL: 9       ROS  Unable to assess secondary to patient's mental status   Past History  History reviewed. No pertinent past medical history. History reviewed.  No pertinent surgical history. History reviewed. No pertinent family history. Social History   Socioeconomic History   Marital status: Married    Spouse name: Not on file   Number of children: Not on file   Years of education: Not on file   Highest education level: Not on file  Occupational History   Not on file  Tobacco Use   Smoking status: Not on file   Smokeless tobacco: Not on file  Substance and Sexual Activity   Alcohol  use: Not on file   Drug use: Not on file   Sexual activity: Not on file  Other Topics Concern   Not on file  Social History Narrative   Not on file   Social Drivers of Health   Financial Resource Strain: Low Risk  (09/02/2023)   [facility]   Overall Financial Resource Strain (CARDIA)    Difficulty of Paying Living Expenses: Not hard at all  Food Insecurity: No Food Insecurity (09/02/2023)   Received from [facility]   Hunger Vital Sign    Worried About Running Out of Food in the Last Year: Never true  Ran Out of Food in the Last Year: Never true  Transportation Needs: No Transportation Needs (09/02/2023)   Received from [facility] - Transportation    Lack of Transportation (Medical): No    Lack of Transportation (Non-Medical): No  Physical Activity: Unknown (09/02/2023)   Received from [facility]   Exercise Vital Sign    Days of Exercise per Week: 0 days    Minutes of Exercise per Session: Not on file  Stress: No Stress Concern Present (09/02/2023)   Received from [facility] - Occupational Stress Questionnaire    Feeling of Stress : Not at all  Social Connections: Socially Integrated (09/02/2023)   Received from [facility]   Social Network    How would you rate your social network (family, work, friends)?: Good participation with social networks   Allergies  Allergen Reactions   Penicillins Hives and Swelling    Medications  - No prior to admission  medications   Vitals   Vitals:   12/16/23 0205 12/16/23 0206 12/16/23 0220 12/16/23 0230  BP: (!) 204/106  (!) 150/90 (!) 147/85  Pulse: 96  87 89  Resp: (!) 27  (!) 22 (!) 22  SpO2: 100% 100% 100% 100%  Weight:      Height:  5' 8 (1.727 m)       Body mass index is 38.08 kg/m.  Physical Exam   Constitutional: Appears ill Psych: Affect cooperative Eyes: No scleral injection.  HENT: No OP obstruction.  Head: Normocephalic.  Cardiovascular: Normal rate and regular rhythm.  Respiratory: Effort normal, non-labored breathing.  GI: Soft.  No distension. There is no tenderness.    Neurologic Examination    Neuro: Mental Status: Patient is requires repeated stimulation to attend to examiner, keeping eyes closed otherwise but when awakened, oriented to age and month No signs of aphasia or neglect within limits of somnolence, but unable to fully name/repeat due to mental status Cranial Nerves: II: Visual Fields are full, able to count fingers in all quadrants. Pupils are equal, round, and reactive to light initially, just before intubation left pupil was noted to be smaller than the right but still reactive.   III,IV, VI: EOMI to orienting to examiner's voice bilaterally V: Facial sensation is symmetric to light touch VII: Facial movement is symmetric. VIII: hearing is intact to voice Motor: Drift of the left upper extremity but does not hit the bed.  No drift of the right upper extremity.  Right lower extremity hits the bed, left lower extremity has some movement but is not antigravity Sensory: Patient is unsure whether sensation is symmetric in all 4 but he is able to respond to touch in all 4 Cerebellar: Unable to test due to somnolence   Labs   CBC:  Recent Labs  Lab 12/16/23 0136 12/16/23 0141  WBC 9.3  --   NEUTROABS 1.8  --   HGB 14.7 15.6  HCT 46.0 46.0  MCV 86.1  --   PLT 297  --     Basic Metabolic Panel:  Lab Results  Component Value Date   NA 141  12/16/2023   K 3.2 (L) 12/16/2023   CO2 22 12/16/2023   GLUCOSE 160 (H) 12/16/2023   BUN 17 12/16/2023   CREATININE 1.20 12/16/2023   CALCIUM  9.5 12/16/2023   GFRNONAA >60 12/16/2023   Lipid Panel: No results found for: LDLCALC HgbA1c: No results found for: HGBA1C Urine Drug Screen: No results  found for: LABOPIA, COCAINSCRNUR, LABBENZ, AMPHETMU, THCU, LABBARB  Alcohol  Level     Component Value Date/Time   ETH <10 12/16/2023 0136   INR  Lab Results  Component Value Date   INR 1.1 12/16/2023   APTT  Lab Results  Component Value Date   APTT 23 (L) 12/16/2023     CT Head without contrast(Personally reviewed): Large acute 5.3 cm intraparenchymal hemorrhage in the left cerebellum. Mass effect with narrowed fourth ventricle, but no hydrocephalus at this time. Basal cisterns are effaced and there is early ascending transtentorial herniation.  CT angio Head and Neck with contrast(Personally reviewed): Deferred to stabilize patient (blood pressure control, intubation, OR)   Impression   Wayne Lee is a 48 y.o. male with a past medical history of hyperlipidemia, obesity and possible sleep apnea presenting with left cerebellar ICH with intraventricular hemorrhage.  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  Secondary Diagnosis: Intraventricular hemorrhage Cerebral edema Cerebral herniation Hyperlipidemia, obesity and possible sleep apnea Potential aspiration at home (emesis, lethargy)  Recommendations   # Hemorrhagic stroke, etiology to be determined - emergent intubation to secure airway, appreciate ED team - Blood pressure goal SBP 130 - 150  - CTA head and neck after airway secured and BP stabilized - 3% saline 250 cc bolus and then 75 cc/hr hour pending surgery, plan to d/c post operatively  - Emergent neurosurgical  consultation, appreciate plan for OR for decompression  - Stroke labs HgbA1c, fasting lipid panel - MRI brain w/ and w/o when stabilized to eval for underlying mass  - Stability scan postoperatively with  or per neurosurgery recommendations  - Echocardiogram - No antiplatelets due to ICH, hold till cleared by neurosurgery and stroke team - DVT PPx heparin  when cleared by neurosurgery, SCDs for now - Risk factor modification - Telemetry monitoring; - PT consult, OT consult, Speech consult when patient stabilized  - Critical care consultation for ventilator management after OR, concern for potential aspiration at home - Admitted to stroke team ______________________________________________________________________   Bonney Lola Jernigan MD-PhD [facility] [phone]  CRITICAL CARE Performed by: Lola LITTIE Jernigan   Total critical care time: 80 minutes  Critical care time was exclusive of separately billable procedures and treating other patients.  Critical care was necessary to treat or prevent imminent or life-threatening deterioration.  Critical care was time spent personally by me on the following activities: development of treatment plan with patient and/or surrogate as well as nursing, discussions with consultants, evaluation of patient's response to treatment, examination of patient, obtaining history from patient or surrogate, ordering and performing treatments and interventions, ordering and review of laboratory studies, ordering and review of radiographic studies, pulse oximetry and re-evaluation of patient's condition.

## 2023-12-16 NOTE — Progress Notes (Signed)
 RT assisted with patient transport to CT and returned to 4N26 without complications.

## 2023-12-16 NOTE — Op Note (Signed)
 Date of surgery: 12/16/2023 Preoperative diagnosis: Cerebellar hemorrhage Postoperative diagnosis: Same Procedure: Suboccipital craniectomy with decompression of cerebellar hemorrhage Surgeon: Victory Gens Anesthesia: General Endotracheal Indications: Patient is a 48 year old individual who developed lethargy nausea vomiting and was brought to the emergency department.  CT scan demonstrated presence of a large cerebellar hemorrhage measuring 5 cm in diameter with compression of the brainstem.  He was intubated in the emergency room he is taken to the operating room to undergo surgical decompression on the emergent basis.  Procedure: Patient was brought to the operating room supine on the stretcher.  After the smooth induction of general endotracheal anesthesia having already been intubated and arterial line the central line was placed.  He was then placed in the 3 pin headrest and turned to the prone position.  The back of the neck was shaved prepped with alcohol  and DuraPrep and draped in a sterile fashion.  Midline incision was created in the suboccipital region and carried down to the fascia.  Fascia was carefully dissected using a monopolar cautery.  The suboccipital region was exposed and the center of the midline and then off to the left side with the bulk of the hemorrhage had occurred.  The squamous portion of the suboccipital bone was identified and after an adequate exposure was performed high-speed drill was used to drill down through the suboccipital region.  The squamosal portion was identified and this was then opened and the opening was enlarged into the superior portion of the suboccipital region to the midline.  Some epidural bleeding was encountered in this region this was cauterized and bony bleeding was also tamped up with Surgifoam.  Once the enlarged opening was big enough a Y shaped incision was created in the dura.  There is significant cerebellar tissue herniated under a marked amount  of pressure.  The cerebellum was then penetrated until the blood clot was reached and under a moderate amount of pressure a clot of blood was removed and several large fragments.  This gave immediate decompression of the suboccipital region.  The decompression was followed by immediate venous bleeding and this was somewhat difficult to control there was also some arterial bleeding encountered which was controlled with bipolar cautery.  Several rounds of cautery was required in the suboccipital region.  No abnormal tissues were encountered however the bleeding site was noted to be multiple and had both an arteriolar component in addition to venous component.  Ultimately was able to gain control by tamponading in 1 area cauterizing and another and working around the cavity to secure hemostasis.  Multiple rounds of irrigation were performed in an effort to control and identify the small bleeding points which were controlled with the bipolar cautery.  In the end the cavity measured over 2 cm in size.  The surrounding pia was carefully cauterized and good hemostasis was achieved.  The door had to be cauterized back to expose the opening further into the suboccipital region particularly towards the superior midline portion where the bulk of the bleeding was occurring.  Once this was controlled and hemostasis established a 2 x 2 cm piece of DuraGen was placed into the cavity and carefully placed under the dura over the pia between the borders of the cerebellum and the pia itself.  4 separate sutures of 4-0 Nurolon were then placed across the dural opening in an effort to bring this together.  Once the closure was best obtained was obtained the soft tissues were then tamponaded with additional Surgifoam this  was later irrigated away.  Care was taken to make sure all the deep tissues were quite hemostatic.  Then the deep layers were closed with #1 Vicryl in interrupted fashion.  2-0 Vicryl was used in the subcutaneous  tissues.  3-0 Vicryl was used subcuticularly and 4-0 Vicryl was used in the final subcuticular closure.  Dermabond was placed on the incision.  The patient was then turned to the supine position and the 3 pin headrest was removed.  Blood loss was estimated 350 cc.  Patient was returned to the ICU for further postoperative management.

## 2023-12-16 NOTE — ED Notes (Signed)
 4mg  zofran given per Madilyn Hook, MD

## 2023-12-16 NOTE — Anesthesia Procedure Notes (Signed)
 Central Venous Catheter Insertion Performed by: Darlyn Rush, MD, anesthesiologist Start/End1/05/2024 3:44 AM, 12/16/2023 3:59 AM Patient location: Pre-op. Preanesthetic checklist: patient identified, IV checked, site marked, risks and benefits discussed, surgical consent, monitors and equipment checked, pre-op evaluation, timeout performed and anesthesia consent Position: supine Lidocaine  1% used for infiltration and patient sedated Hand hygiene performed , maximum sterile barriers used  and Seldinger technique used Catheter size: 8 Fr Total catheter length 16. Central line was placed.Double lumen Procedure performed using ultrasound guided technique. Ultrasound Notes:anatomy identified, needle tip was noted to be adjacent to the nerve/plexus identified, no ultrasound evidence of intravascular and/or intraneural injection and image(s) printed for medical record Attempts: 1 Following insertion, dressing applied, line sutured and Biopatch. Post procedure assessment: blood return through all ports, free fluid flow and no air  Patient tolerated the procedure well with no immediate complications.

## 2023-12-16 NOTE — ED Provider Notes (Signed)
 [place] EMERGENCY DEPARTMENT AT [facility] Provider Note   CSN: [id number] Arrival date & time: 12/16/23  9866  An emergency department physician performed an initial assessment on this suspected stroke patient at 0138.  History  Chief Complaint  Patient presents with   Code Stroke    Wayne Lee is a 48 y.o. male.  The history is provided by the EMS personnel and medical records.  Wayne Lee is a 48 y.o. male who presents to the Emergency Department complaining of code stroke.  He presents to the emergency department as a code stroke, last known well at 11 PM.  He went up the stairs to go to bed and had 2 episodes of emesis.  EMS note left-sided weakness.  No significant medical history.  No tobacco, alcohol , drug use.     Home Medications Prior to Admission medications   Medication Sig Start Date End Date Taking? Authorizing Provider  cyclobenzaprine  (FLEXERIL ) 10 MG tablet Take 1 tablet (10 mg total) by mouth 3 (three) times daily as needed for muscle spasms. 07/27/21   Vicky Charleston, PA-C  ibuprofen  (ADVIL ) 800 MG tablet Take 1 tablet (800 mg total) by mouth 3 (three) times daily. 07/27/21   Vicky Charleston, PA-C      Allergies    Penicillins    Review of Systems   Review of Systems  Unable to perform ROS: Acuity of condition    Physical Exam Updated Vital Signs BP (!) 140/98   Pulse 77   Temp (!) 97.5 F (36.4 C) (Axillary)   Resp 19   Ht 5' 8 (1.727 m)   Wt 113.6 kg   SpO2 100%   BMI 38.08 kg/m  Physical Exam Vitals and nursing note reviewed.  Constitutional:      General: He is in acute distress.     Appearance: He is well-developed. He is ill-appearing.     Comments: Lethargic  HENT:     Head: Normocephalic and atraumatic.  Cardiovascular:     Rate and Rhythm: Normal rate and regular rhythm.     Heart sounds: No murmur heard. Pulmonary:     Effort: Pulmonary effort is normal. No respiratory distress.     Comments: Crackles in  the left lung fields Abdominal:     Palpations: Abdomen is soft.     Tenderness: There is no abdominal tenderness. There is no guarding or rebound.  Musculoskeletal:        General: No tenderness.  Skin:    General: Skin is warm and dry.  Neurological:     Comments: Lethargic, awakens to verbal stimuli.  Profound weakness, greatest on the left side.  Psychiatric:        Behavior: Behavior normal.     ED Results / Procedures / Treatments   Labs (all labs ordered are listed, but only abnormal results are displayed) Labs Reviewed  APTT - Abnormal; Notable for the following components:      Result Value   aPTT 23 (*)    All other components within normal limits  DIFFERENTIAL - Abnormal; Notable for the following components:   Lymphs Abs 5.7 (*)    Monocytes Absolute 1.2 (*)    Eosinophils Absolute 0.6 (*)    All other components within normal limits  COMPREHENSIVE METABOLIC PANEL - Abnormal; Notable for the following components:   Potassium 3.1 (*)    Glucose, Bld 160 (*)    AST 51 (*)    ALT 76 (*)    All  other components within normal limits  I-STAT CHEM 8, ED - Abnormal; Notable for the following components:   Potassium 3.2 (*)    Glucose, Bld 160 (*)    All other components within normal limits  CBG MONITORING, ED - Abnormal; Notable for the following components:   Glucose-Capillary 144 (*)    All other components within normal limits  CBG MONITORING, ED - Abnormal; Notable for the following components:   Glucose-Capillary 132 (*)    All other components within normal limits  PROTIME-INR  CBC  ETHANOL  SODIUM  SODIUM  SODIUM  SODIUM  HIV ANTIBODY (ROUTINE TESTING W REFLEX)  TYPE AND SCREEN  ABO/RH    EKG EKG Interpretation Date/Time:  Monday December 16 2023 02:24:15 EST Ventricular Rate:  88 PR Interval:  160 QRS Duration:  90 QT Interval:  379 QTC Calculation: 459 R Axis:   91  Text Interpretation: Sinus rhythm Borderline right axis deviation  Confirmed by Griselda Norris [phone]) on 12/16/2023 2:41:26 AM  Radiology CT ANGIO HEAD NECK W WO CM (CODE STROKE) Result Date: 12/16/2023 CLINICAL DATA:  Neuro deficit, acute, stroke suspected Please also do 5 min delay if it will not delay OR EXAM: CT ANGIOGRAPHY HEAD AND NECK WITH AND WITHOUT CONTRAST TECHNIQUE: Multidetector CT imaging of the head and neck was performed using the standard protocol during bolus administration of intravenous contrast. Multiplanar CT image reconstructions and MIPs were obtained to evaluate the vascular anatomy. Carotid stenosis measurements (when applicable) are obtained utilizing NASCET criteria, using the distal internal carotid diameter as the denominator. RADIATION DOSE REDUCTION: This exam was performed according to the departmental dose-optimization program which includes automated exposure control, adjustment of the mA and/or kV according to patient size and/or use of iterative reconstruction technique. CONTRAST:  75mL OMNIPAQUE  IOHEXOL  350 MG/ML SOLN COMPARISON:  Same day CT head. FINDINGS: CTA NECK FINDINGS Aortic arch: The vessel origins are patent. Right carotid system: No evidence of dissection, stenosis (50% or greater), or occlusion. Left carotid system: No evidence of dissection, stenosis (50% or greater), or occlusion. Vertebral arteries: Right dominant. No evidence of dissection, stenosis (50% or greater), or occlusion. Skeleton: No acute abnormality on limited assessment. Other neck: No acute abnormality on limited assessment. Upper chest: Visualized lung apices are clear. Review of the MIP images confirms the above findings CTA HEAD FINDINGS Anterior circulation: Bilateral intracranial ICAs, MCAs, and ACAs are patent without proximal hemodynamically significant stenosis. No aneurysm identified. Posterior circulation: Bilateral intradural vertebral arteries, basilar artery and bilateral posterior cerebral are patent. Severe right P2 PCA stenosis mild left P2 PCA  stenosis. No aneurysm or arteriovenous malformation identified; however, acute blood products limits assessment. Venous sinuses: As permitted by contrast timing, patent. Limited assessment due to arterial timing. Review of the MIP images confirms the above findings IMPRESSION: 1. No emergent large vessel occlusion. 2. Moderate right P2 PCA stenosis. 3. No aneurysm or vascular malformation identified; however, acute blood products limits assessment. Electronically Signed   By: Gilmore GORMAN Molt M.D.   On: 12/16/2023 03:46   DG Chest Portable 1 View Result Date: 12/16/2023 CLINICAL DATA:  Tube placement EXAM: PORTABLE CHEST 1 VIEW COMPARISON:  None available FINDINGS: Endotracheal tube is 1.3 cm above the carina. NG tube tip is in the mid to distal esophagus. Low lung volumes. No confluent opacities or effusions. Heart is normal size. IMPRESSION: NG tube tip is in the mid to distal esophagus and should be advanced 10-15 cm. Low lung volumes. Electronically Signed   By:  Franky Crease M.D.   On: 12/16/2023 03:13   CT HEAD CODE STROKE WO CONTRAST Result Date: 12/16/2023 CLINICAL DATA:  Code stroke. EXAM: CT HEAD WITHOUT CONTRAST TECHNIQUE: Contiguous axial images were obtained from the base of the skull through the vertex without intravenous contrast. RADIATION DOSE REDUCTION: This exam was performed according to the departmental dose-optimization program which includes automated exposure control, adjustment of the mA and/or kV according to patient size and/or use of iterative reconstruction technique. COMPARISON:  CT head 07/26/2021. FINDINGS: Brain: Acute 5.3 x 3.8 x 2.3 cm (estimated volume of 23 mL) intraparenchymal hemorrhage centered in the left cerebellum. The hemorrhage approaches the fourth ventricle without definite intraventricular extension. Significant surrounding edema with mass effect and marked effacement of the fourth ventricle. No hydrocephalus at this time. Basal cistern effacement and early  ascending transtentorial herniation. No evidence of acute large vascular territory infarct or visible mass lesion. Vascular: No hyperdense vessel. Skull: No acute fracture. Sinuses/Orbits: Mild paranasal sinus mucosal thickening. No acute orbital findings. Other: No mastoid effusions. ASPECTS [facility] Stroke Program Early CT Score) total score (0-10 with 10 being normal): 10. IMPRESSION: Large acute 5.3 cm intraparenchymal hemorrhage in the left cerebellum. Mass effect with narrowed fourth ventricle, but no hydrocephalus at this time. Basal cisterns are effaced and there is early ascending transtentorial herniation. Critical code stroke imaging results were communicated on 12/16/2023 at 1:45 am to provider Bhagat via secure text paging. Electronically Signed   By: Gilmore GORMAN Molt M.D.   On: 12/16/2023 01:53    Procedures Procedure Name: Intubation Date/Time: 12/16/2023 2:12 AM  Performed by: Griselda Norris, MDPre-anesthesia Checklist: Patient identified, Patient being monitored, Emergency Drugs available, Timeout performed and Suction available Oxygen Delivery Method: Non-rebreather mask Preoxygenation: Pre-oxygenation with 100% oxygen Induction Type: Rapid sequence Ventilation: Mask ventilation without difficulty Laryngoscope Size: Glidescope Tube size: 7.5 mm Number of attempts: 2 Placement Confirmation: ETT inserted through vocal cords under direct vision, CO2 detector and Breath sounds checked- equal and bilateral Tube secured with: ETT holder Comments: Initial intubation attempt with esophageal intubation.  Endotracheal tube was removed and patient was bagged without difficulty with improvement of sats and second attempt with good view of cords and tube passed easily.     CRITICAL CARE Performed by: Norris Griselda   Total critical care time: 30 minutes  Critical care time was exclusive of separately billable procedures and treating other patients.  Critical care was necessary to  treat or prevent imminent or life-threatening deterioration.  Critical care was time spent personally by me on the following activities: development of treatment plan with patient and/or surrogate as well as nursing, discussions with consultants, evaluation of patient's response to treatment, examination of patient, obtaining history from patient or surrogate, ordering and performing treatments and interventions, ordering and review of laboratory studies, ordering and review of radiographic studies, pulse oximetry and re-evaluation of patient's condition.    Medications Ordered in ED Medications  sodium chloride  flush (NS) 0.9 % injection 3 mL ( Intravenous MAR Hold 12/16/23 0414)  docusate (COLACE) 50 MG/5ML liquid 100 mg ( Per Tube Automatically Held 12/24/23 2200)  polyethylene glycol (MIRALAX  / GLYCOLAX ) packet 17 g ( Per Tube Automatically Held 12/24/23 1000)  fentaNYL  (SUBLIMAZE ) injection 50 mcg ( Intravenous MAR Hold 12/16/23 0414)  fentaNYL  in NS 250mL (10mcg/ml) infusion-PREMIX (100 mcg/hr Intravenous Rate/Dose Change 12/16/23 0435)  fentaNYL  (SUBLIMAZE ) bolus via infusion 50-100 mcg ( Intravenous MAR Hold 12/16/23 0414)  propofol  (DIPRIVAN ) 1000 MG/100ML infusion ( Intravenous MAR Hold  12/16/23 0414)  clevidipine  (CLEVIPREX ) infusion 0.5 mg/mL (has no administration in time range)   stroke: early stages of recovery book ( Does not apply Automatically Held 12/17/23 1000)  acetaminophen  (TYLENOL ) tablet 650 mg ( Oral MAR Hold 12/16/23 0414)    Or  acetaminophen  (TYLENOL ) 160 MG/5ML solution 650 mg ( Per Tube MAR Hold 12/16/23 0414)    Or  acetaminophen  (TYLENOL ) suppository 650 mg ( Rectal MAR Hold 12/16/23 0414)  senna-docusate (Senokot-S) tablet 1 tablet ( Per Tube Automatically Held 12/24/23 2200)  pantoprazole  (PROTONIX ) injection 40 mg ( Intravenous Automatically Held 12/24/23 2200)  sodium chloride  (hypertonic) 3 % solution (has no administration in time range)  bacitracin  ointment (1  Application Topical Given 12/16/23 0431)  0.9 % irrigation (POUR BTL) (2,000 mLs Irrigation Given 12/16/23 0434)  bupivacaine  (MARCAINE ) 0.5 % (with pres) injection (5 mLs Infiltration Given 12/16/23 0430)  lidocaine -EPINEPHrine  (XYLOCAINE  W/EPI) 1 %-1:100000 (with pres) injection (5 mLs Infiltration Given 12/16/23 0430)  Surgifoam 1 Gm with Thrombin  5,000 units (5 ml) topical solution ( Topical Given 12/16/23 0437)  Surgifoam 100 with Thrombin  20,000 units (20 ml) topical solution ( Topical Given 12/16/23 0438)  hemostatic agents (no charge) Optime (1 Application Topical Given 12/16/23 0440)  0.9 %  sodium chloride  infusion (Manually program via Guardrails IV Fluids) (has no administration in time range)  sodium chloride  3% (hypertonic) IV bolus 250 mL (0 mLs Intravenous Stopped 12/16/23 0244)  iohexol  (OMNIPAQUE ) 350 MG/ML injection 75 mL (75 mLs Intravenous Contrast Given 12/16/23 0329)    ED Course/ Medical Decision Making/ A&P                                 Medical Decision Making Amount and/or Complexity of Data Reviewed Labs: ordered. Radiology: ordered.  Risk Decision regarding hospitalization.   Patient here as a code stroke for altered mental status, left-sided weakness.  Patient seen by neurology at time of ED arrival with emergent CT head obtained, which demonstrates intraparenchymal hemorrhage.  Given patient's severity of deficits, prehospital vomiting RSI was performed for airway protection and respiratory support.  Plan to admit to the ICU with surgical intervention.        Final Clinical Impression(s) / ED Diagnoses Final diagnoses:  Intraparenchymal hemorrhage of brain [facility])    Rx / DC Orders ED Discharge Orders     None         Griselda Norris, MD 12/16/23 MANUS

## 2023-12-16 NOTE — ED Notes (Signed)
 Iver Nestle, MD aware of BP.

## 2023-12-16 NOTE — Progress Notes (Signed)
   12/16/23 0328  Spiritual Encounters  Type of Visit Initial  Care provided to: Pt and family  Conversation partners present during encounter Nurse;Physician  Referral source Nurse (RN/NT/LPN)  Reason for visit Urgent spiritual support  OnCall Visit Yes   Chaplain responding to call for ED to support family of Pt.  Upon arriving at the room the RN informed me that the Pt's family was distraught and struggling with waiting in the consult room while the medical team made their assessments and provided care. Chaplain was asked to sit with family and support them in their anxiety, helping to provide updates, and offering them compassionate presence and hospitality. Chaplain sat with wife and mother of Pt for approx 45 minutes when neurosurgeon came to update them.  When Pt was subsequently taken to CT and then OR, Chaplain escorted the family to the surgical waiting area and helped orient them to where they were inside the hospital. Family is support each other well at this point.  Chaplain services remain available by Spiritual Consult or for emergent cases, paging [phone]  Chaplain Maude Roll, MDiv [email or web address] [phone]

## 2023-12-17 ENCOUNTER — Inpatient Hospital Stay (HOSPITAL_COMMUNITY): Payer: PRIVATE HEALTH INSURANCE

## 2023-12-17 ENCOUNTER — Encounter (HOSPITAL_COMMUNITY): Payer: Self-pay | Admitting: Neurological Surgery

## 2023-12-17 DIAGNOSIS — I619 Nontraumatic intracerebral hemorrhage, unspecified: Secondary | ICD-10-CM | POA: Diagnosis not present

## 2023-12-17 LAB — GLUCOSE, CAPILLARY
Glucose-Capillary: 108 mg/dL — ABNORMAL HIGH (ref 70–99)
Glucose-Capillary: 115 mg/dL — ABNORMAL HIGH (ref 70–99)
Glucose-Capillary: 130 mg/dL — ABNORMAL HIGH (ref 70–99)
Glucose-Capillary: 150 mg/dL — ABNORMAL HIGH (ref 70–99)
Glucose-Capillary: 155 mg/dL — ABNORMAL HIGH (ref 70–99)
Glucose-Capillary: 89 mg/dL (ref 70–99)

## 2023-12-17 LAB — CBC
HCT: 38.6 % — ABNORMAL LOW (ref 39.0–52.0)
Hemoglobin: 12.2 g/dL — ABNORMAL LOW (ref 13.0–17.0)
MCH: 27.2 pg (ref 26.0–34.0)
MCHC: 31.6 g/dL (ref 30.0–36.0)
MCV: 86 fL (ref 80.0–100.0)
Platelets: 242 10*3/uL (ref 150–400)
RBC: 4.49 MIL/uL (ref 4.22–5.81)
RDW: 13.9 % (ref 11.5–15.5)
WBC: 11.7 10*3/uL — ABNORMAL HIGH (ref 4.0–10.5)
nRBC: 0 % (ref 0.0–0.2)

## 2023-12-17 LAB — HEMOGLOBIN A1C
Hgb A1c MFr Bld: 6.1 % — ABNORMAL HIGH (ref 4.8–5.6)
Mean Plasma Glucose: 128 mg/dL

## 2023-12-17 LAB — COMPREHENSIVE METABOLIC PANEL
ALT: 54 U/L — ABNORMAL HIGH (ref 0–44)
AST: 38 U/L (ref 15–41)
Albumin: 3.2 g/dL — ABNORMAL LOW (ref 3.5–5.0)
Alkaline Phosphatase: 73 U/L (ref 38–126)
Anion gap: 9 (ref 5–15)
BUN: 9 mg/dL (ref 6–20)
CO2: 25 mmol/L (ref 22–32)
Calcium: 8.9 mg/dL (ref 8.9–10.3)
Chloride: 108 mmol/L (ref 98–111)
Creatinine, Ser: 0.99 mg/dL (ref 0.61–1.24)
GFR, Estimated: >60 mL/min (ref >60)
Glucose, Bld: 134 mg/dL — ABNORMAL HIGH (ref 70–99)
Potassium: 4.2 mmol/L (ref 3.5–5.1)
Sodium: 142 mmol/L (ref 135–145)
Total Bilirubin: 0.5 mg/dL (ref 0.0–1.2)
Total Protein: 6.3 g/dL — ABNORMAL LOW (ref 6.5–8.1)

## 2023-12-17 LAB — LIPID PANEL
Cholesterol: 140 mg/dL (ref 0–200)
HDL: 29 mg/dL — ABNORMAL LOW (ref >40)
LDL Cholesterol: 81 mg/dL (ref 0–99)
Total CHOL/HDL Ratio: 4.8 {ratio}
Triglycerides: 148 mg/dL (ref <150)
VLDL: 30 mg/dL (ref 0–40)

## 2023-12-17 LAB — SODIUM
Sodium: 143 mmol/L (ref 135–145)
Sodium: 144 mmol/L (ref 135–145)
Sodium: 144 mmol/L (ref 135–145)

## 2023-12-17 LAB — TRIGLYCERIDES: Triglycerides: 149 mg/dL (ref <150)

## 2023-12-17 LAB — PROCALCITONIN: Procalcitonin: 0.12 ng/mL

## 2023-12-17 MED ORDER — AMLODIPINE BESYLATE 10 MG PO TABS
10.0000 mg | ORAL_TABLET | Freq: Every day | ORAL | Status: DC
  Administered 2023-12-18 – 2023-12-19 (×2): 10 mg via ORAL
  Filled 2023-12-17 (×2): qty 1

## 2023-12-17 MED ORDER — ORAL CARE MOUTH RINSE: 15.0000 mL | OROMUCOSAL | Status: DC | PRN

## 2023-12-17 MED ORDER — ONDANSETRON HCL 4 MG/2ML IJ SOLN: 4.0000 mg | INTRAMUSCULAR | Status: DC | PRN

## 2023-12-17 MED ORDER — BUTALBITAL-APAP-CAFFEINE 50-325-40 MG PO TABS
1.0000 | ORAL_TABLET | Freq: Four times a day (QID) | ORAL | Status: DC | PRN
  Administered 2023-12-17 – 2023-12-20 (×10): 1 via ORAL
  Filled 2023-12-17 (×11): qty 1

## 2023-12-17 MED ORDER — POLYETHYLENE GLYCOL 3350 17 G PO PACK
17.0000 g | PACK | Freq: Every day | ORAL | Status: DC
  Administered 2023-12-18 – 2023-12-20 (×3): 17 g via ORAL
  Filled 2023-12-17 (×3): qty 1

## 2023-12-17 MED ORDER — PROMETHAZINE HCL 25 MG PO TABS: 12.5000 mg | ORAL_TABLET | ORAL | Status: DC | PRN

## 2023-12-17 MED ORDER — SENNOSIDES-DOCUSATE SODIUM 8.6-50 MG PO TABS
1.0000 | ORAL_TABLET | Freq: Two times a day (BID) | ORAL | Status: DC
  Administered 2023-12-17 – 2023-12-20 (×6): 1 via ORAL
  Filled 2023-12-17 (×3): qty 1

## 2023-12-17 MED ORDER — ACETAMINOPHEN 650 MG RE SUPP: 650.0000 mg | RECTAL | Status: DC | PRN

## 2023-12-17 MED ORDER — SODIUM CHLORIDE 23.4 % INJECTION (4 MEQ/ML) FOR IV ADMINISTRATION
120.0000 meq | Freq: Once | INTRAVENOUS | Status: AC
  Administered 2023-12-17: 120 meq via INTRAVENOUS
  Filled 2023-12-17: qty 30

## 2023-12-17 MED ORDER — ACETAMINOPHEN 160 MG/5ML PO SOLN
650.0000 mg | ORAL | Status: DC | PRN
Start: 2023-12-17 — End: 2023-12-19
  Administered 2023-12-17 – 2023-12-19 (×3): 650 mg via ORAL
  Filled 2023-12-17: qty 20.3

## 2023-12-17 MED ORDER — ONDANSETRON HCL 4 MG PO TABS: 4.0000 mg | ORAL_TABLET | ORAL | Status: DC | PRN

## 2023-12-17 MED ORDER — POLYVINYL ALCOHOL 1.4 % OP SOLN
2.0000 [drp] | OPHTHALMIC | Status: DC | PRN
  Administered 2023-12-17: 2 [drp] via OPHTHALMIC
  Filled 2023-12-17: qty 15

## 2023-12-17 MED ORDER — GADOBUTROL 1 MMOL/ML IV SOLN
10.0000 mL | Freq: Once | INTRAVENOUS | Status: AC | PRN
  Administered 2023-12-17: 10 mL via INTRAVENOUS

## 2023-12-17 NOTE — Progress Notes (Addendum)
 STROKE TEAM PROGRESS NOTE   BRIEF HPI [name] is a 48 y.o. male with history of obesity, sleep apnea, and HLD presenting with headache and dizziness. BP on arrival 204/106. He was taken emergently to the OR for hematoma evacuation and hypertonic saline was started. Remained intubated post procedure. Extubated 12/17/2023  NIH on Admission 9   SIGNIFICANT HOSPITAL EVENTS 1/6- Admitted to 4N, s/p suboccipital craniotomy  1/7 - Extubated  INTERIM HISTORY/SUBJECTIVE Wife and other family at the bedside. Today Na 143, will give a bolus of 23.4% today. Extubated today. Passed bedside swallow screen. Full liquid diet today and advanced to heart healthy tomorrow. He is reporting headache at surgical site, PRN tylenol  available.   OBJECTIVE  CBC    Component Value Date/Time   WBC 11.7 (H) 12/17/2023 0617   RBC 4.49 12/17/2023 0617   HGB 12.2 (L) 12/17/2023 0617   HCT 38.6 (L) 12/17/2023 0617   PLT 242 12/17/2023 0617   MCV 86.0 12/17/2023 0617   MCH 27.2 12/17/2023 0617   MCHC 31.6 12/17/2023 0617   RDW 13.9 12/17/2023 0617   LYMPHSABS 5.7 (H) 12/16/2023 0136   MONOABS 1.2 (H) 12/16/2023 0136   EOSABS 0.6 (H) 12/16/2023 0136   BASOSABS 0.0 12/16/2023 0136    BMET    Component Value Date/Time   NA 143 12/17/2023 0815   K 4.2 12/17/2023 0617   CL 108 12/17/2023 0617   CO2 25 12/17/2023 0617   GLUCOSE 134 (H) 12/17/2023 0617   BUN 9 12/17/2023 0617   CREATININE 0.99 12/17/2023 0617   CALCIUM  8.9 12/17/2023 0617   GFRNONAA >60 12/17/2023 0617    IMAGING past 24 hours MR BRAIN W WO CONTRAST Result Date: 12/17/2023 CLINICAL DATA:  48 year old male code stroke presentation yesterday with relatively large cerebellar hemorrhage. Postoperative day 1 status post craniotomy, evacuation. EXAM: MRI HEAD WITHOUT AND WITH CONTRAST TECHNIQUE: Multiplanar, multiecho pulse sequences of the brain and surrounding structures were obtained without and with intravenous contrast. CONTRAST:   10mL GADAVIST  GADOBUTROL  1 MMOL/ML IV SOLN COMPARISON:  CT head, CTA head and neck yesterday. FINDINGS: Brain: Left suboccipital craniotomy with central and left cerebellar hemisphere resection cavity. Residual blood products with susceptibility encompassing an area of 35 by 56 by roughly 15 mm (AP by transverse by CC), approximately 15 mL. Cerebellar mass effect and edema appear regressed since 0141 hours yesterday, but not resolved. Improved basilar cistern patency. Fourth ventricle is patent. Small volume subarachnoid hemorrhage bilaterally,, including supratentorial. Little to no intraventricular blood. Susceptibility artifact on DWI, no restricted diffusion suggestive of acute infarction. Following contrast there is no suspicious enhancement in or around the cerebellar hemorrhage. No supratentorial midline shift, mass effect, ventriculomegaly. Cervicomedullary junction and pituitary are within normal limits. Supratentorial scattered mild to moderate for age nonspecific white matter T2 and FLAIR hyperintensity. No cerebral cortical encephalomalacia identified. No supratentorial hemosiderin identified. No abnormal enhancement identified. No abnormal dural thickening identified. Vascular: Major intracranial vascular flow voids are preserved. Following contrast major dural venous sinuses are enhancing and appear to be patent. Skull and upper cervical spine: Suboccipital craniectomy and tracking postoperative suboccipital fluid (series 17, image 11). Normal background bone marrow signal. Negative visible cervical spine. Sinuses/Orbits: Postoperative changes to the left globe. Otherwise negative orbits. Intubated. Scattered fluid and mucosal thickening in the paranasal sinuses. Other: Mastoids are well aerated. Grossly normal visible internal auditory structures. Fluid in the pharynx related to intubation. IMPRESSION: 1. Postoperative day 1 left suboccipital craniectomy and evacuation of cerebellar hemorrhage.  Residual approximately 15 mL hematoma with small volume bilateral subarachnoid blood. No evidence of underlying tumor or lesion. Cerebellar edema and mass effect have regressed since presentation, with improved basilar cistern and 4th ventricle patency. 2. Supratentorial mild to moderate for age nonspecific white matter signal changes. No supratentorial hemosiderin. 3. No new intracranial abnormality. Electronically Signed   By: VEAR Hurst M.D.   On: 12/17/2023 06:50   CT HEAD WO CONTRAST Result Date: 12/16/2023 CLINICAL DATA:  Follow-up craniotomy EXAM: CT HEAD WITHOUT CONTRAST TECHNIQUE: Contiguous axial images were obtained from the base of the skull through the vertex without intravenous contrast. RADIATION DOSE REDUCTION: This exam was performed according to the departmental dose-optimization program which includes automated exposure control, adjustment of the mA and/or kV according to patient size and/or use of iterative reconstruction technique. COMPARISON:  CT studies earlier same day FINDINGS: Brain: Interval left occipital craniectomy for evacuation of a previously seen large intraparenchymal hematoma. Subtotal evacuation with only a small amount of residual hematoma being visible. Fluid and a few air bubbles along the operative approach. Much less mass effect. No evidence of developing hydrocephalus. No new or unexpected finding. Vascular: No acute vascular finding. Skull: Otherwise negative Sinuses/Orbits: Clear/normal Other: None IMPRESSION: Interval left occipital craniectomy for evacuation of a previously seen large intraparenchymal hematoma. Subtotal evacuation with only a small amount of residual hematoma being visible. Much less mass effect. No evidence of developing hydrocephalus. No new or unexpected finding. Electronically Signed   By: Oneil Officer M.D.   On: 12/16/2023 18:08   ECHOCARDIOGRAM COMPLETE Result Date: 12/16/2023    ECHOCARDIOGRAM REPORT   Patient Name:   Wayne Lee Date of Exam:  12/16/2023 Medical Rec #:  [id number]     Height:       68.0 in Accession #:    [id number]    Weight:       250.4 lb Date of Birth:  Aug 04, 1976    BSA:          2.249 m Patient Age:    47 years      BP:           149/85 mmHg Patient Gender: M             HR:           85 bpm. Exam Location:  Inpatient Procedure: 2D Echo, Cardiac Doppler, Color Doppler and Intracardiac            Opacification Agent Indications:   Stroke  History:       Patient has no prior history of Echocardiogram examinations.                Stroke.  Sonographer:   Lanell Maduro Referring      [id number] Phys: IMPRESSIONS  1. No apical thrombus. Left ventricular ejection fraction, by estimation, is 65 to 70%. The left ventricle has normal function. The left ventricle has no regional wall motion abnormalities. Left ventricular diastolic parameters are consistent with Grade  I diastolic dysfunction (impaired relaxation).  2. Right ventricular systolic function is normal. The right ventricular size is normal. Tricuspid regurgitation signal is inadequate for assessing PA pressure.  3. The mitral valve is normal in structure. No evidence of mitral valve regurgitation.  4. The aortic valve is tricuspid. Aortic valve regurgitation is not visualized.  5. The inferior vena cava is normal in size with greater than 50% respiratory variability, suggesting right atrial pressure of 3 mmHg. Comparison(s): No prior Echocardiogram. FINDINGS  Left Ventricle: No apical thrombus. Left ventricular ejection fraction, by estimation, is 65 to 70%. The left ventricle has normal function. The left ventricle has no regional wall motion abnormalities. Definity  contrast agent was given IV to delineate the left ventricular endocardial borders. The left ventricular internal cavity size was normal in size. There is no left ventricular hypertrophy. Left ventricular diastolic parameters are consistent with Grade I diastolic dysfunction (impaired relaxation). Indeterminate  filling pressures. Right Ventricle: The right ventricular size is normal. No increase in right ventricular wall thickness. Right ventricular systolic function is normal. Tricuspid regurgitation signal is inadequate for assessing PA pressure. Left Atrium: Left atrial size was normal in size. Right Atrium: Right atrial size was normal in size. Pericardium: There is no evidence of pericardial effusion. Mitral Valve: The mitral valve is normal in structure. No evidence of mitral valve regurgitation. Tricuspid Valve: The tricuspid valve is normal in structure. Tricuspid valve regurgitation is not demonstrated. Aortic Valve: The aortic valve is tricuspid. Aortic valve regurgitation is not visualized. Pulmonic Valve: The pulmonic valve was normal in structure. Pulmonic valve regurgitation is not visualized. Aorta: The aortic root and ascending aorta are structurally normal, with no evidence of dilitation. Venous: The inferior vena cava is normal in size with greater than 50% respiratory variability, suggesting right atrial pressure of 3 mmHg. IAS/Shunts: No atrial level shunt detected by color flow Doppler.  LEFT VENTRICLE PLAX 2D LVIDd:         4.10 cm      Diastology LVIDs:         2.40 cm      LV e' medial:    6.31 cm/s LV PW:         1.00 cm      LV E/e' medial:  11.8 LV IVS:        1.00 cm      LV e' lateral:   10.90 cm/s LVOT diam:     1.90 cm      LV E/e' lateral: 6.8 LV SV:         83 LV SV Index:   37 LVOT Area:     2.84 cm  LV Volumes (MOD) LV vol d, MOD A2C: 88.4 ml LV vol d, MOD A4C: 119.0 ml LV vol s, MOD A2C: 37.2 ml LV vol s, MOD A4C: 39.5 ml LV SV MOD A2C:     51.2 ml LV SV MOD A4C:     119.0 ml LV SV MOD BP:      65.0 ml RIGHT VENTRICLE RV Basal diam:  3.70 cm RV S prime:     19.00 cm/s TAPSE (M-mode): 2.6 cm LEFT ATRIUM             Index        RIGHT ATRIUM           Index LA diam:        3.30 cm 1.47 cm/m   RA Area:     12.90 cm LA Vol (A2C):   39.7 ml 17.65 ml/m  RA Volume:   27.90 ml  12.41 ml/m  LA Vol (A4C):   44.4 ml 19.74 ml/m LA Biplane Vol: 43.0 ml 19.12 ml/m  AORTIC VALVE LVOT Vmax:   175.00 cm/s LVOT Vmean:  105.000 cm/s LVOT VTI:    0.294 m  AORTA Ao Root diam: 2.80 cm Ao Asc diam:  2.90 cm MITRAL VALVE MV Area (PHT): 4.89 cm    SHUNTS MV Decel Time: 155 msec  Systemic VTI:  0.29 m MV E velocity: 74.40 cm/s  Systemic Diam: 1.90 cm MV A velocity: 87.50 cm/s MV E/A ratio:  0.85 Vinie Maxcy MD Electronically signed by Vinie Maxcy MD Signature Date/Time: 12/16/2023/4:50:12 PM    Final     Vitals:   12/17/23 0800 12/17/23 0830 12/17/23 0845 12/17/23 0900  BP: (!) 159/94 (!) 142/89 138/86 (!) 142/82  Pulse: 91 93 91 93  Resp: 18 19 20  (!) 24  Temp: 99.5 F (37.5 C) 100.2 F (37.9 C) (!) 100.4 F (38 C) (!) 100.4 F (38 C)  TempSrc:      SpO2: 97% 96% 97% 98%  Weight:      Height:         PHYSICAL EXAM General:  Alert, well-nourished, well-developed patient in no acute distress Psych:  Mood and affect appropriate for situation CV: Regular rate and rhythm on monitor Respiratory:  Regular, unlabored respirations on room air GI: Abdomen soft and nontender   NEURO:  Mental Status: Drosy, following commands Speech/Language:   Cranial Nerves:  II: PERRL. Eye midline with forced eye opening III, IV, VI:  V: Sensation is intact to light touch and symmetrical to face.  VII: Face is symmetrical resting and smiling VIII: hearing intact to voice. IX, X: Cough and gag intact KP:Yzji is midline XII: tongue is midline  Motor: Moves all extremities antigravity, upper extremities more than lower Tone: is normal and bulk is normal Sensation- Withdraws to painful stimuli Coordination: HKS with no ataxia ,  Gait- deferred  ASSESSMENT/PLAN  Large cerebellar hemorrhage s/p craniectomy with hematoma evacuation Etiology: Likely hypertensive Code Stroke CT head - Large acute 5.3 cm intraparenchymal hemorrhage in the left cerebellum. Mass effect with narrowed fourth  ventricle, but no hydrocephalus at this time. Basal cisterns are effaced and there is early ascending transtentorial herniation. CTA head & neck moderate right P2 stenosis 1/6 - CT head repeat - Interval left occipital craniectomy for evacuation of the large hematoma. Subtotal evacuation with only a small amount of residual hematoma being visible. Much less mass effect. No evidence of developing hydrocephalus.  1/7 MRI w/wo- Postoperative day 1 left suboccipital craniectomy and evacuation of cerebellar hemorrhage. Residual approximately 15 mL hematoma with small volume bilateral subarachnoid blood. No evidence of underlying tumor or lesion. Cerebellar edema and mass effect have regressed since presentation, with improved basilar cistern and 4th ventricle patency.  1/8 CT Head- Pending  2D Echo - 65 -70% grade 1 diastolic dysfunction LDL 81 HgbA1c 6.1 UDS negative VTE prophylaxis -SCDs No antithrombotic prior to admission, now on No antithrombotic in the setting of IPH Therapy recommendations:  Pending Disposition:  Remain in Neuro ICU  Cerebellar edema S/p craniectomy for hematoma evacuation Continue hypertonic saline at 5ml/hr Na 144- 142 -143-144 Q6 Na monitor for hydrocephalus Bolus 23.4% 1/7  Acute respiratory insufficiency Intubated peri prodecure   Extubated 1/7 Tolerating well so far CCM on board  Hypertension Home meds: None BP elevated on arrival with systolic in the 200s On amlodipine  10 BP Goal less than 160 Off Cleviprex  Long term BP goal normotensive  Hyperlipidemia  Not on home meds yet Had recent dietitian appointment LDL 81, goal < 70 Consider statin at discharge  Dysphagia No on clear liquid and thin liquid Aspiration precaution Speech on board  Other Stroke Risk Factors Obesity, Body mass index is 38.08 kg/m., BMI >/= 30 associated with increased stroke risk, recommend weight loss, diet and exercise as appropriate OSA   Other Active  Problems Hypokalemia (stable) 3.2 -> 4.4 -> 4.2 Elevated liver enzymes - improving  AST/ALT - 51/76 -> 38/54 Leukocytosis WBC 4.8 -> 11.7  Hospital day # 1  Patient seen and examined by NP/APP with MD. MD to update note as needed.   Jorene Last, DNP, FNP-BC [facility] Pager: [phone]   ATTENDING NOTE: I reviewed above note and agree with the assessment and plan. Pt was seen and examined.   Wife is at the bedside. Pt lying in bed, was extubated this am, seems tolerating well, lethargic but open eyes on voice, able to tell me his age, orientated to place and time, severe dysarthria, able to name 2/2 and repeat in dysarthric voice. Follows all simple commands. Pupil 1.19mm bilaterally, sluggish to light, not consistently blinking to visual threat, slow tracking but no nystagmus or disconjugate gaze. Facial symmetrical. Tongue midline. Moving b/l UE and LEs equally. LUE slight dysmetria, BLEs no ataix although slow.   CT and MRI showed improved posterior fossa crowding. No hydro. Na 143 today, continue 3% but give 23.4% x 1. Passed swallow on clear liquid. BP stable, off cleviprex , on amlodipine  10. LFTs improving but WBC elevated, will continue to monitor. PT and OT pending. Will follow  For detailed assessment and plan, please refer to above/below as I have made changes wherever appropriate.   Ary Cummins, MD PhD Stroke Neurology 12/17/2023 10:01 PM  This patient is critically ill due to large cerebellar ICH, s/p evacuation, respiratory failure and at significant risk of neurological worsening, death form cerebral edema, brain herniation, hydrocephalus. This patient's care requires constant monitoring of vital signs, hemodynamics, respiratory and cardiac monitoring, review of multiple databases, neurological assessment, discussion with family, other specialists and medical decision making of high complexity. I spent 35 minutes of neurocritical care time in the care of  this patient. I had long discussion with wife at bedside, updated pt current condition, treatment plan and potential prognosis, and answered all the questions. She expressed understanding and appreciation.    To contact Stroke Continuity provider, please refer to [email or web address]. After hours, contact General Neurology

## 2023-12-17 NOTE — Plan of Care (Signed)
 Problem: Education: Goal: Knowledge of disease or condition will improve Outcome: Progressing Goal: Knowledge of secondary prevention will improve (MUST DOCUMENT ALL) Outcome: Progressing Goal: Knowledge of patient specific risk factors will improve Alonso N/A or DELETE if not current risk factor) Outcome: Progressing   Problem: Intracerebral Hemorrhage Tissue Perfusion: Goal: Complications of Intracerebral Hemorrhage will be minimized Outcome: Progressing   Problem: Coping: Goal: Will verbalize positive feelings about self Outcome: Progressing Goal: Will identify appropriate support needs Outcome: Progressing   Problem: Health Behavior/Discharge Planning: Goal: Ability to manage health-related needs will improve Outcome: Progressing Goal: Goals will be collaboratively established with patient/family Outcome: Progressing   Problem: Self-Care: Goal: Ability to participate in self-care as condition permits will improve Outcome: Progressing Goal: Verbalization of feelings and concerns over difficulty with self-care will improve Outcome: Progressing Goal: Ability to communicate needs accurately will improve Outcome: Progressing   Problem: Nutrition: Goal: Risk of aspiration will decrease Outcome: Progressing   Problem: Education: Goal: Knowledge of General Education information will improve Description: Including pain rating scale, medication(s)/side effects and non-pharmacologic comfort measures Outcome: Progressing   Problem: Health Behavior/Discharge Planning: Goal: Ability to manage health-related needs will improve Outcome: Progressing   Problem: Clinical Measurements: Goal: Ability to maintain clinical measurements within normal limits will improve Outcome: Progressing Goal: Will remain free from infection Outcome: Progressing Goal: Diagnostic test results will improve Outcome: Progressing Goal: Respiratory complications will improve Outcome: Progressing Goal:  Cardiovascular complication will be avoided Outcome: Progressing   Problem: Activity: Goal: Risk for activity intolerance will decrease Outcome: Progressing   Problem: Coping: Goal: Level of anxiety will decrease Outcome: Progressing   Problem: Elimination: Goal: Will not experience complications related to urinary retention Outcome: Progressing   Problem: Pain Management: Goal: General experience of comfort will improve Outcome: Progressing   Problem: Safety: Goal: Ability to remain free from injury will improve Outcome: Progressing   Problem: Skin Integrity: Goal: Risk for impaired skin integrity will decrease Outcome: Progressing   Problem: Education: Goal: Knowledge of the prescribed therapeutic regimen will improve Outcome: Progressing   Problem: Clinical Measurements: Goal: Usual level of consciousness will be regained or maintained. Outcome: Progressing Goal: Neurologic status will improve Outcome: Progressing Goal: Ability to maintain intracranial pressure will improve Outcome: Progressing   Problem: Skin Integrity: Goal: Demonstration of wound healing without infection will improve Outcome: Progressing   Problem: Activity: Goal: Ability to tolerate increased activity will improve Outcome: Progressing   Problem: Respiratory: Goal: Ability to maintain a clear airway and adequate ventilation will improve Outcome: Progressing   Problem: Role Relationship: Goal: Method of communication will improve Outcome: Progressing   Problem: Education: Goal: Ability to describe self-care measures that may prevent or decrease complications (Diabetes Survival Skills Education) will improve Outcome: Progressing Goal: Individualized Educational Video(s) Outcome: Progressing   Problem: Coping: Goal: Ability to adjust to condition or change in health will improve Outcome: Progressing   Problem: Fluid Volume: Goal: Ability to maintain a balanced intake and output  will improve Outcome: Progressing   Problem: Health Behavior/Discharge Planning: Goal: Ability to identify and utilize available resources and services will improve Outcome: Progressing Goal: Ability to manage health-related needs will improve Outcome: Progressing   Problem: Metabolic: Goal: Ability to maintain appropriate glucose levels will improve Outcome: Progressing   Problem: Nutritional: Goal: Maintenance of adequate nutrition will improve Outcome: Progressing Goal: Progress toward achieving an optimal weight will improve Outcome: Progressing   Problem: Skin Integrity: Goal: Risk for impaired skin integrity will decrease Outcome: Progressing   Problem:  Nutrition: Goal: Dietary intake will improve Outcome: Not Progressing   Problem: Nutrition: Goal: Adequate nutrition will be maintained Outcome: Not Progressing   Problem: Elimination: Goal: Will not experience complications related to bowel motility Outcome: Not Progressing

## 2023-12-17 NOTE — Progress Notes (Addendum)
 eLink Physician-Brief Progress Note Patient Name: Wayne Lee DOB: Dec 12, 1975 MRN: [id number]   Date of Service  12/17/2023  HPI/Events of Note  48 year old male admitted for subdural hematoma status post suboccipital craniotomy.  Refractory headache despite Tylenol   eICU Interventions  Add Fioricet    0310 -currently on 3% infusion, sodium goal 150-155 and sodium down trended from 1 44-1 42.  Maintain current 3% rate.  May need to consider DDAVP.  Send urine studies.  Intervention Category Minor Interventions: Routine modifications to care plan (e.g. PRN medications for pain, fever)  Analya Louissaint 12/17/2023, 9:59 PM

## 2023-12-17 NOTE — Progress Notes (Signed)
 NAME:  Wayne Lee, MRN:  [id number], DOB:  1976/05/21, LOS: 1 ADMISSION DATE:  12/16/2023, CONSULTATION DATE:  12/15/22 REFERRING MD:  Dr. Colon, CHIEF COMPLAINT:  postop   History of Present Illness:  Pt encephalopathic, therefore HPI obtained from medical chart review.   61yoM with PMH of HLD, BMI 38, and possible OSA who presented after developing headache, dizziness, 2 episodes of emesis, and left sided weakness, LSW 2300.  Code stroke on arrival.  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  CTA head/ neck negative for LVO, moderate right P2 PCA stenosis, no aneurysm or vascular malformation identified but limited assessment due to arterial timing.  NSGY consulted. Given prior vomiting episodes and decreased mental status, pt intubated for airway protection.  SBP's 140-220 in ER.  Pt taken emergently to OR by Dr. Colon for suboccipital craniectomy with decompression of cerebellar hemorrhage.  Pt returns to ICU sedated on mechanical ventilation.  PCCM consulted for medical and ventilator management in ICU.   Pertinent  Medical History  Obesity, HLD, ?OSA No tobacco, ETOH or reported drug use  Significant Hospital Events: Including procedures, antibiotic start and stop dates in addition to other pertinent events   1/6 Admit, L cerebellar ICH w/ IVH s/p suboccipital crani  Interim History / Subjective:  MRI overnight No events   Objective   Blood pressure 117/72, pulse 85, temperature 99.3 F (37.4 C), resp. rate 15, height 5' 8 (1.727 m), weight 113.6 kg, SpO2 98%.    Vent Mode: PRVC FiO2 (%):  [40 %] 40 % Set Rate:  [22 bmp] 22 bmp Vt Set:  [550 mL] 550 mL PEEP:  [5 cmH20] 5 cmH20 Plateau Pressure:  [17 cmH20-19 cmH20] 19 cmH20   Intake/Output Summary (Last 24 hours) at 12/17/2023 9247 Last data filed at  12/17/2023 0700 Gross per 24 hour  Intake 2522.29 ml  Output 5000 ml  Net -2477.71 ml   Filed Weights   12/16/23 0148  Weight: 113.6 kg   Examination: Fent 100> off, propofol  30 General:  Adult male in NAD on MV HEENT: MM pink/moist, ETT/ OGT (bilious), pupils 3/r, posterior/ occipital dressing dry Neuro:  wakes up to verbal, intermittently restless but able to settle out, follows simple commands, MAE CV: rr, NSR, no murmur PULM:  non labored on MV, clear, scant white secretions, good cough, flipped to PSV 5/5 tolerating well GI: soft, bs+, NT, foley -cyu Extremities: warm/dry, no LE edema  Skin: no rashes   Tmax 100.9 UOP 5L/ 24hrs -2.4L Net -1.2L Labs reviewed> Na 142, otherwise CMET stable, down trending AST/ ALT, WBC 4.8> 11.7, H/H stable   Resolved Hospital Problem list    Assessment & Plan:   Left cerebellar ICH with IVH, cerebral edema - non traumatic, suspected due to underlying hypertension P - per Neurology and NSGY - SBP goal 130-150, currently within goal - CTH yest and MRI brain overnight showing residual 15ml hematoma and small volume bilateral SAH, no evidence of underlying lesion/ tumor.  Regressed cerebellar edema and mass effect with improved basilar cistern and 4th ventricle patency - HTS per Neurology> remains at 59ml/hr, trend Na q6hrs, goal 150-155, currently 142 but improving edema on imaging, defer to neuro - empiric keppra  per NSGY - complete post op abx - serial neuro exams - neuro protective measures  - further stroke workup per Neurology - further  imaging per NSGY or any deterioration in mental status   Acute respiratory insufficiency Possible aspiration PNA - given multiple emesis/ AMS - hopeful for extubation today, tolerating PSV this am and following commands.  Full MV support prn - WBC and fever curve up but scant white secretions and weaning.  Will check PCT, if elevated, consider empiric asp coverage.  Also could be reactive - VAP  prevention protocol/ PPI - PAD protocol for sedation, RASS goal 0> minimize propofol  gtt.  Hopeful not to need precedex.  Stop fentanyl  gtt, change to prn.  cont bowel regimen - goal sat SpO2 >92%  - pulm hygiene - NPO after extubation, swallow eval    Leukocytosis - tmax 100.9, WBC up.  Check PCT   Hypokalemia, resolved -  trend on BMET, goal K > 4, Mag> 2   HLD - lipid panel HDL 29, LDL 81, triglycerides 149   Elevated AST/ ALT - improved.  Trend periodically   Hyperglycemia - likely stress response, has not needed prn SSI - A1c 6.1   Obesity, BMI 38 - outpt f/u, education when appropriate    Best Practice (right click and Reselect all SmartList Selections daily)   Diet/type: NPO DVT prophylaxis SCD Pressure ulcer(s): N/A GI prophylaxis: H2B Lines: Central line and yes and it is still needed Foley:  Yes, and it is still needed> likely d/c after extubation Code Status:  full code Last date of multidisciplinary goals of care discussion [pending]  Wife updated at bedside 1/7 am  Labs   CBC: Recent Labs  Lab 12/16/23 0136 12/16/23 0141 12/16/23 0502 12/16/23 0718 12/16/23 0748 12/17/23 0617  WBC 9.3  --   --  4.8  --  11.7*  NEUTROABS 1.8  --   --   --   --   --   HGB 14.7 15.6 12.9* 11.9* 11.9* 12.2*  HCT 46.0 46.0 38.0* 36.3* 35.0* 38.6*  MCV 86.1  --   --  83.6  --  86.0  PLT 297  --   --  226  --  242    Basic Metabolic Panel: Recent Labs  Lab 12/16/23 0136 12/16/23 0141 12/16/23 0145 12/16/23 0502 12/16/23 0718 12/16/23 0748 12/16/23 1438 12/16/23 2043 12/17/23 0617  NA 138 141   < > 141 141 143 142 144 142  K 3.1* 3.2*  --  4.4 4.0 4.0  --   --  4.2  CL 102 104  --   --  110  --   --   --  108  CO2 22  --   --   --  20*  --   --   --  25  GLUCOSE 160* 160*  --   --  140*  --   --   --  134*  BUN 14 17  --   --  11  --   --   --  9  CREATININE 1.18 1.20  --   --  1.01  --   --   --  0.99  CALCIUM  9.5  --   --   --  8.6*  --   --    --  8.9  MG  --   --   --   --  1.8  --   --   --   --    < > = values in this interval not displayed.   GFR: Estimated Creatinine Clearance: 114.1 mL/min (by C-G formula based on SCr of 0.99  mg/dL). Recent Labs  Lab 12/16/23 0136 12/16/23 0718 12/17/23 0617  WBC 9.3 4.8 11.7*    Liver Function Tests: Recent Labs  Lab 12/16/23 0136 12/17/23 0617  AST 51* 38  ALT 76* 54*  ALKPHOS 98 73  BILITOT 0.7 0.5  PROT 7.4 6.3*  ALBUMIN  4.0 3.2*   No results for input(s): LIPASE, AMYLASE in the last 168 hours. No results for input(s): AMMONIA in the last 168 hours.  ABG    Component Value Date/Time   PHART 7.360 12/16/2023 0748   PCO2ART 37.5 12/16/2023 0748   PO2ART 134 (H) 12/16/2023 0748   HCO3 21.6 12/16/2023 0748   TCO2 23 12/16/2023 0748   ACIDBASEDEF 4.0 (H) 12/16/2023 0748   O2SAT 99 12/16/2023 0748     Coagulation Profile: Recent Labs  Lab 12/16/23 0136  INR 1.1    Cardiac Enzymes: No results for input(s): CKTOTAL, CKMB, CKMBINDEX, TROPONINI in the last 168 hours.  HbA1C: Hgb A1c MFr Bld  Date/Time Value Ref Range Status  12/16/2023 07:18 AM 6.1 (H) 4.8 - 5.6 % Final    Comment:    (NOTE)         Prediabetes: 5.7 - 6.4         Diabetes: >6.4         Glycemic control for adults with diabetes: <7.0     CBG: Recent Labs  Lab 12/16/23 1548 12/16/23 1938 12/16/23 2348 12/17/23 0359 12/17/23 0741  GLUCAP 126* 110* 120* 115* 89     Allergies Allergies  Allergen Reactions   Apple Other (See Comments) and Nausea And Vomiting    RED ONLY   Corn-Containing Products Other (See Comments) and Nausea And Vomiting   Penicillins Hives and Swelling     Home Medications  Prior to Admission medications   Medication Sig Start Date End Date Taking? Authorizing Provider  cyclobenzaprine  (FLEXERIL ) 10 MG tablet Take 1 tablet (10 mg total) by mouth 3 (three) times daily as needed for muscle spasms. 07/27/21   Vicky Charleston, PA-C  ibuprofen   (ADVIL ) 800 MG tablet Take 1 tablet (800 mg total) by mouth 3 (three) times daily. 07/27/21   Vicky Charleston, PA-C     Critical care time: 35 mins       Lyle Pesa, MSN, AG-ACNP-BC  Pulmonary & Critical Care 12/17/2023, 7:52 AM  See Amion for pager If no response to pager , please call [phone] until 7pm After 7:00 pm call Elink  [phone]

## 2023-12-17 NOTE — Progress Notes (Signed)
 PT Cancellation Note  Patient Details Name: Wayne Lee MRN: [id number] DOB: 23-May-1976   Cancelled Treatment:    Reason Eval/Treat Not Completed: Active bedrest order; not looking ready for PT yet.  Will follow up for medical readiness.   Montie Portal 12/17/2023, 9:29 AM Micheline Portal, PT Acute Rehabilitation Services Office:[phone] 12/17/2023

## 2023-12-17 NOTE — Progress Notes (Signed)
 Transported patient to MRI while patient was on the ventilator. Patient remained stable during transport.

## 2023-12-17 NOTE — Procedures (Signed)
 Extubation Procedure Note  Patient Details:   Name: Wayne Lee DOB: June 30, 1976 MRN: [id number]   Airway Documentation:    Vent end date: 12/17/23 Vent end time: 0937   Evaluation  O2 sats: stable throughout Complications: No apparent complications Patient did tolerate procedure well. Bilateral Breath Sounds: Clear   Yes  RT extubated pt to 4L [name] per order. Pt had a positive cuff leak, good cough, able to state name and no stridor noted.   Shan LITTIE Collum 12/17/2023, 9:37 AM

## 2023-12-18 ENCOUNTER — Inpatient Hospital Stay (HOSPITAL_COMMUNITY): Payer: PRIVATE HEALTH INSURANCE

## 2023-12-18 ENCOUNTER — Inpatient Hospital Stay (HOSPITAL_COMMUNITY): Payer: 59

## 2023-12-18 DIAGNOSIS — I614 Nontraumatic intracerebral hemorrhage in cerebellum: Secondary | ICD-10-CM | POA: Diagnosis not present

## 2023-12-18 DIAGNOSIS — I619 Nontraumatic intracerebral hemorrhage, unspecified: Secondary | ICD-10-CM | POA: Diagnosis not present

## 2023-12-18 LAB — SODIUM
Sodium: 142 mmol/L (ref 135–145)
Sodium: 142 mmol/L (ref 135–145)
Sodium: 142 mmol/L (ref 135–145)

## 2023-12-18 LAB — PROCALCITONIN: Procalcitonin: 0.14 ng/mL

## 2023-12-18 LAB — BASIC METABOLIC PANEL
Anion gap: 8 (ref 5–15)
BUN: 10 mg/dL (ref 6–20)
CO2: 25 mmol/L (ref 22–32)
Calcium: 8.7 mg/dL — ABNORMAL LOW (ref 8.9–10.3)
Chloride: 110 mmol/L (ref 98–111)
Creatinine, Ser: 0.91 mg/dL (ref 0.61–1.24)
GFR, Estimated: >60 mL/min (ref >60)
Glucose, Bld: 113 mg/dL — ABNORMAL HIGH (ref 70–99)
Potassium: 3.7 mmol/L (ref 3.5–5.1)
Sodium: 143 mmol/L (ref 135–145)

## 2023-12-18 LAB — GLUCOSE, CAPILLARY
Glucose-Capillary: 125 mg/dL — ABNORMAL HIGH (ref 70–99)
Glucose-Capillary: 142 mg/dL — ABNORMAL HIGH (ref 70–99)
Glucose-Capillary: 153 mg/dL — ABNORMAL HIGH (ref 70–99)
Glucose-Capillary: 88 mg/dL (ref 70–99)
Glucose-Capillary: 95 mg/dL (ref 70–99)
Glucose-Capillary: 97 mg/dL (ref 70–99)

## 2023-12-18 LAB — SODIUM, URINE, RANDOM: Sodium, Ur: 252 mmol/L

## 2023-12-18 LAB — CBC
HCT: 36.6 % — ABNORMAL LOW (ref 39.0–52.0)
Hemoglobin: 11.6 g/dL — ABNORMAL LOW (ref 13.0–17.0)
MCH: 27.4 pg (ref 26.0–34.0)
MCHC: 31.7 g/dL (ref 30.0–36.0)
MCV: 86.5 fL (ref 80.0–100.0)
Platelets: 208 10*3/uL (ref 150–400)
RBC: 4.23 MIL/uL (ref 4.22–5.81)
RDW: 13.7 % (ref 11.5–15.5)
WBC: 10.8 10*3/uL — ABNORMAL HIGH (ref 4.0–10.5)
nRBC: 0 % (ref 0.0–0.2)

## 2023-12-18 LAB — OSMOLALITY, URINE: Osmolality, Ur: 752 mosm/kg (ref 300–900)

## 2023-12-18 LAB — OSMOLALITY: Osmolality: 307 mosm/kg — ABNORMAL HIGH (ref 275–295)

## 2023-12-18 LAB — MAGNESIUM: Magnesium: 2.3 mg/dL (ref 1.7–2.4)

## 2023-12-18 MED ORDER — LEVETIRACETAM 500 MG PO TABS
500.0000 mg | ORAL_TABLET | Freq: Two times a day (BID) | ORAL | Status: DC
  Administered 2023-12-18 – 2023-12-19 (×2): 500 mg via ORAL
  Filled 2023-12-18 (×2): qty 1

## 2023-12-18 MED ORDER — POTASSIUM CHLORIDE CRYS ER 20 MEQ PO TBCR
40.0000 meq | EXTENDED_RELEASE_TABLET | Freq: Once | ORAL | Status: AC
Start: 2023-12-18 — End: 2023-12-18
  Administered 2023-12-18: 40 meq via ORAL
  Filled 2023-12-18: qty 2

## 2023-12-18 MED FILL — Thrombin For Soln 5000 Unit: CUTANEOUS | Qty: 5000 | Status: AC

## 2023-12-18 NOTE — Progress Notes (Signed)
 SLP Cancellation Note  Patient Details Name: Wayne Lee MRN: [id number] DOB: 1976-03-20   Cancelled treatment:       Reason Eval/Treat Not Completed: Patient at procedure or test/unavailable. Pt working with PT/OT. SLP will continue following to complete cognitive evaluation as able.    Damien Blumenthal, M.A., CF-SLP Speech Language Pathology, Acute Rehabilitation Services  Secure Chat preferred [phone]  12/18/2023, 1:31 PM

## 2023-12-18 NOTE — Evaluation (Signed)
 Speech Language Pathology Evaluation Patient Details Name: Wayne Lee MRN: [id number] DOB: 1976/07/28 Today's Date: 12/18/2023 Time: 8393-8383 SLP Time Calculation (min) (ACUTE ONLY): 10 min  Problem List:  Patient Active Problem List   Diagnosis Date Noted   Nontraumatic acute cerebral hemorrhage (HCC) 12/16/2023   Cerebellar hemorrhage (HCC) 12/16/2023   Past Medical History: History reviewed. No pertinent past medical history. Past Surgical History:  Past Surgical History:  Procedure Laterality Date   SUBOCCIPITAL CRANIECTOMY CERVICAL LAMINECTOMY N/A 12/16/2023   Procedure: SUBOCCIPITAL CRANIECTOMY;  Surgeon: Colon Shove, MD;  Location: [facility] OR;  Service: Neurosurgery;  Laterality: N/A;   HPI:  Wayne Lee is a 48 yo male presenting to ED 1/6 with headache, dizziness, two episodes of emesis, and L sided weakness. CTH shows acute L cerebellar IPH with mass effect. Underwent emergent suboccipital craniotomy 1/6 and remained intubated after the procedure. Extubated 1/7. PMH includes HLD, obesity, possible OSA   Assessment / Plan / Recommendation Clinical Impression  Pt reports working full time and handling all complex cognitive tasks independently. He is oriented x4 and follows all simple commands with 100% accuracy. Pt presents with a flat affect and mild dysarthria. He appears to be experiencing acute cognitive deficits characterized by difficulty with sustained attention, memory (retrieval > storage), and problem solving. He benefitted from semantic cueing during memory recall and naming tasks. Pt's reading and language structure are in tact. Session was limited due to pt's toileting needs but SLP will continue following for ongoing diagnostic treatment to target deficits listed above.    SLP Assessment  SLP Recommendation/Assessment: Patient needs continued Speech Lanaguage Pathology Services SLP Visit Diagnosis: Cognitive communication deficit (R41.841)    Recommendations for  follow up therapy are one component of a multi-disciplinary discharge planning process, led by the attending physician.  Recommendations may be updated based on patient status, additional functional criteria and insurance authorization.    Follow Up Recommendations  Acute inpatient rehab (3hours/day)    Assistance Recommended at Discharge  Frequent or constant Supervision/Assistance  Functional Status Assessment Patient has had a recent decline in their functional status and demonstrates the ability to make significant improvements in function in a reasonable and predictable amount of time.  Frequency and Duration min 2x/week  2 weeks      SLP Evaluation Cognition  Overall Cognitive Status: Impaired/Different from baseline Arousal/Alertness: Awake/alert Orientation Level: Oriented X4 Attention: Sustained Sustained Attention: Impaired Sustained Attention Impairment: Verbal basic;Functional basic Memory: Impaired Memory Impairment: Retrieval deficit Awareness: Impaired Awareness Impairment: Emergent impairment Problem Solving: Impaired Problem Solving Impairment: Verbal basic       Comprehension  Auditory Comprehension Overall Auditory Comprehension: Appears within functional limits for tasks assessed    Expression Expression Primary Mode of Expression: Verbal Verbal Expression Overall Verbal Expression: Impaired Pragmatics: Impairment Impairments: Abnormal affect;Eye contact   Oral / Motor  Oral Motor/Sensory Function Overall Oral Motor/Sensory Function: Within functional limits Motor Speech Overall Motor Speech: Impaired Respiration: Within functional limits Phonation: Normal Resonance: Within functional limits Articulation: Impaired Level of Impairment: Conversation Intelligibility: Intelligibility reduced Conversation: 75-100% accurate            Damien Blumenthal, M.A., CF-SLP Speech Language Pathology, Acute Rehabilitation Services  Secure Chat  preferred [phone]  12/18/2023, 4:43 PM

## 2023-12-18 NOTE — Progress Notes (Signed)
  Inpatient Rehab Admissions Coordinator :  Per therapy recommendations, patient was screened for CIR candidacy by Ottie Glazier RN MSN.  At this time patient appears to be a potential candidate for CIR. I will place a rehab consult per protocol for full assessment. Please call me with any questions.  Ottie Glazier RN MSN Admissions Coordinator [phone]

## 2023-12-18 NOTE — Progress Notes (Addendum)
 STROKE TEAM PROGRESS NOTE   BRIEF HPI [name] is a 48 y.o. male with history of obesity, sleep apnea, and HLD presenting with headache and dizziness. BP on arrival 204/106. He was taken emergently to the OR for hematoma evacuation and hypertonic saline was started. Remained intubated post procedure. Extubated 12/17/2023  NIH on Admission 9   SIGNIFICANT HOSPITAL EVENTS 1/6- Admitted to 4N, s/p suboccipital craniotomy  1/7 - Extubated  INTERIM HISTORY/SUBJECTIVE Patient is seen in his room with his wife at the bedside.  He has remained hemodynamically stable with Tmax of 101.3.  Neurological exam is stable, although patient is drowsy he is oriented to person place time and situation and able to follow simple and two-step commands.  Will taper off hypertonic saline today, given improved exam even though sodium remains at 142 OBJECTIVE  CBC    Component Value Date/Time   WBC 10.8 (H) 12/18/2023 0524   RBC 4.23 12/18/2023 0524   HGB 11.6 (L) 12/18/2023 0524   HCT 36.6 (L) 12/18/2023 0524   PLT 208 12/18/2023 0524   MCV 86.5 12/18/2023 0524   MCH 27.4 12/18/2023 0524   MCHC 31.7 12/18/2023 0524   RDW 13.7 12/18/2023 0524   LYMPHSABS 5.7 (H) 12/16/2023 0136   MONOABS 1.2 (H) 12/16/2023 0136   EOSABS 0.6 (H) 12/16/2023 0136   BASOSABS 0.0 12/16/2023 0136    BMET    Component Value Date/Time   NA 142 12/18/2023 0804   K 3.7 12/18/2023 0524   CL 110 12/18/2023 0524   CO2 25 12/18/2023 0524   GLUCOSE 113 (H) 12/18/2023 0524   BUN 10 12/18/2023 0524   CREATININE 0.91 12/18/2023 0524   CALCIUM  8.7 (L) 12/18/2023 0524   GFRNONAA >60 12/18/2023 0524    IMAGING past 24 hours CT HEAD WO CONTRAST ( ) Result Date: 12/18/2023 CLINICAL DATA:  48 year old male code stroke presentation on 12/16/2023 with large cerebellar hemorrhage. Status post craniectomy and hematoma evacuation. EXAM: CT HEAD WITHOUT CONTRAST TECHNIQUE: Contiguous axial images were obtained from the base of the  skull through the vertex without intravenous contrast. RADIATION DOSE REDUCTION: This exam was performed according to the departmental dose-optimization program which includes automated exposure control, adjustment of the mA and/or kV according to patient size and/or use of iterative reconstruction technique. COMPARISON:  Postoperative head CT 12/16/2023.  Brain MRI yesterday. FINDINGS: Brain: Postoperative changes to the posterior cerebellum near the midline with stable small volume of patchy residual hyperdense hemorrhage (series 2, image 24), postoperative extra-axial fluid since 12/16/2023. Small volume pneumocephalus has regressed. Satisfactory basilar cistern and 4th ventricle patency now. Small volume mostly cerebellar folia subarachnoid blood. No ventriculomegaly. No convincing IVH. No supratentorial edema or mass effect. Supratentorial gray-white differentiation appears stable and negative. Vascular: No suspicious intracranial vascular hyperdensity. Skull: Stable left midline suboccipital craniectomy. Sinuses/Orbits: Scattered mild to moderate paranasal sinus mucosal thickening. Tympanic cavities and mastoids remain clear. Other: Suboccipital craniectomy postoperative changes. Small volume of postoperative fluid/seroma overlying the surgical defect does not appear significantly changed. Elsewhere stable orbit and scalp soft tissues. IMPRESSION: 1. Regressed small volume pneumocephalus with otherwise unchanged postoperative appearance of the cerebellum since hematoma evacuation. Small volume residual hemorrhage. Satisfactory 4th ventricle and basilar cistern patency now. 2. No new intracranial abnormality. Electronically Signed   By: VEAR Hurst M.D.   On: 12/18/2023 06:25    Vitals:   12/18/23 0600 12/18/23 0700 12/18/23 0800 12/18/23 1200  BP: (!) 102/57 (!) 123/102    Pulse:  Resp: (!) 31 (!) 22    Temp:   99.4 F (37.4 C) 98.7 F (37.1 C)  TempSrc:   Axillary Oral  SpO2:      Weight:       Height:         PHYSICAL EXAM General:  Alert, well-nourished, well-developed patient in no acute distress Psych:  Mood and affect appropriate for situation CV: Regular rate and rhythm on monitor Respiratory:  Regular, unlabored respirations on room air GI: Abdomen soft and nontender   NEURO:  Mental Status: Drowsy but oriented to person place time and situation, able to follow simple and two-step commands Speech/Language: Speech is in single words and short phrases, voice somewhat slow  Cranial Nerves:  II: PERRL.  III, IV, VI: Extraocular movements intact, keeps eyes mostly closed unless prompted to open them V: Sensation is intact to light touch and symmetrical to face.  VII: Face is symmetrical resting and smiling VIII: hearing intact to voice. IX, X: Phonation is normal KP:Yzji is midline XII: tongue is midline  Motor: Moves all extremities antigravity with good strength Tone: is normal and bulk is normal Sensation- Withdraws to painful stimuli Coordination: Mild ataxia with finger-to-nose, left worse than right, heel-knee-shin intact Gait- deferred  ASSESSMENT/PLAN  Large cerebellar hemorrhage s/p craniectomy with hematoma evacuation Etiology: Likely hypertensive Code Stroke CT head - Large acute 5.3 cm intraparenchymal hemorrhage in the left cerebellum. Mass effect with narrowed fourth ventricle, but no hydrocephalus at this time. Basal cisterns are effaced and there is early ascending transtentorial herniation. CTA head & neck moderate right P2 stenosis 1/6 - CT head repeat - Interval left occipital craniectomy for evacuation of the large hematoma. Subtotal evacuation with only a small amount of residual hematoma being visible. Much less mass effect. No evidence of developing hydrocephalus.  1/7 MRI w/wo- Postoperative day 1 left suboccipital craniectomy and evacuation of cerebellar hemorrhage. Residual approximately 15 mL hematoma with small volume bilateral  subarachnoid blood. No evidence of underlying tumor or lesion. Cerebellar edema and mass effect have regressed since presentation, with improved basilar cistern and 4th ventricle patency.  1/8 CT Head- unchanged postoperative appearance of the cerebellum and small volume residual hemorrhage with patency of basilar cistern and fourth ventricle 2D Echo - 65 -70% grade 1 diastolic dysfunction LDL 81 HgbA1c 6.1 UDS negative VTE prophylaxis -SCDs No antithrombotic prior to admission, now on No antithrombotic in the setting of IPH Therapy recommendations:  Pending Disposition: pending   Cerebellar edema S/p craniectomy for hematoma evacuation Na 144- 142 -143-144-142 Q6 Na monitor for hydrocephalus Bolus 23.4% 1/7 Will start to taper off hypertonic saline   Acute respiratory insufficiency Intubated peri prodecure   Extubated 1/7 Tolerating well so far CCM on board  Hypertension Home meds: None BP elevated on arrival with systolic in the 200s On amlodipine  10 BP Goal less than 160 Off Cleviprex  Long term BP goal normotensive  Hyperlipidemia  Not on home meds yet Had recent dietitian appointment LDL 81, goal < 70 Consider statin at discharge  Dysphagia No on clear liquid and thin liquid Aspiration precaution Speech on board  Other Stroke Risk Factors Obesity, Body mass index is 38.08 kg/m., BMI >/= 30 associated with increased stroke risk, recommend weight loss, diet and exercise as appropriate OSA   Other Active Problems Hypokalemia (stable) 3.2 -> 4.4 -> 4.2-> 3.7 Elevated liver enzymes - improving  AST/ALT - 51/76 -> 38/54 Leukocytosis WBC 4.8 -> 11.7-> 10.8  Hospital day # 2  Patient  seen and examined by NP/APP with MD. MD to update note as needed.   Cortney E Everitt Wayne Lee , MSN, AGACNP-BC [facility] See Amion for schedule and pager information 12/18/2023 1:15 PM   ATTENDING NOTE: I reviewed above note and agree with the assessment and plan. Pt  was seen and examined.   Wife at the bedside. Pt more awake alert than  yesterday but still lethargic, follows commands and cooperative with exam, still has b/l FTN ataxia left more than right but b/l HTS grossly intact. Mild dysarthria and more interactive today. Repeat CT stable. Na still 142, will tape off given Na never at goal. OK to transfer out of ICU if stable tomorrow.   For detailed assessment and plan, please refer to above/below as I have made changes wherever appropriate.   Ary Cummins, MD PhD Stroke Neurology 12/18/2023 7:21 PM  This patient is critically ill due to large cerebellar ICH, s/p evacuation, respiratory failure and at significant risk of neurological worsening, death form cerebral edema, brain herniation, hydrocephalus. This patient's care requires constant monitoring of vital signs, hemodynamics, respiratory and cardiac monitoring, review of multiple databases, neurological assessment, discussion with family, other specialists and medical decision making of high complexity. I spent 30 minutes of neurocritical care time in the care of this patient. I had long discussion with wife at bedside, updated pt current condition, treatment plan and potential prognosis, and answered all the questions. She expressed understanding and appreciation.   To contact Stroke Continuity provider, please refer to [email or web address]. After hours, contact General Neurology

## 2023-12-18 NOTE — Evaluation (Signed)
 Physical Therapy Evaluation Patient Details Name: Wayne Lee MRN: [id number] DOB: November 10, 1976 Today's Date: 12/18/2023  History of Present Illness  48 yo male arrival 1/6 with HTN 204/106 taken to emergent OR hematoma evacuation s/p suboccipital craniotomy and hypertonic saline started. Extubated 1/7 PMH obesity, sleep apnea, HLD  Clinical Impression  Pt admitted with/for suboccipital bleed with crani for evac.  Pt needing min to mod assist of 1-2 people for basic mobility and gait.  Pt currently limited functionally due to the problems listed. ( See problems list.)   Pt will benefit from PT to maximize function and safety in order to get ready for next venue listed below.         If plan is discharge home, recommend the following: A little help with walking and/or transfers;A little help with bathing/dressing/bathroom;Assistance with cooking/housework;Assist for transportation;Help with stairs or ramp for entrance   Can travel by private vehicle        Equipment Recommendations Other (comment) (TBD)  Recommendations for Other Services  Rehab consult    Functional Status Assessment Patient has had a recent decline in their functional status and demonstrates the ability to make significant improvements in function in a reasonable and predictable amount of time.     Precautions / Restrictions Precautions Precautions: Fall Precaution Comments: watch BP      Mobility  Bed Mobility Overal bed mobility: Needs Assistance Bed Mobility: Supine to Sit, Sit to Supine     Supine to sit: Min assist, +2 for physical assistance     General bed mobility comments: initial struggle bring weight from supine to L side needing light min assist.  Once up balance was variable and unsteady with pt staying upright with effort.    Transfers Overall transfer level: Needs assistance Equipment used: Sliding board Transfers: Sit to/from Stand Sit to Stand: Min assist, Mod assist            General transfer comment: cues for hand placement, min stability assist with some forward assist, but no need for boost.    Ambulation/Gait Ambulation/Gait assistance: Mod assist, +2 safety/equipment Gait Distance (Feet): 12 Feet (to bathroom and 70 into the hallway and back) Assistive device: Rolling walker (2 wheels), 1 person hand held assist Gait Pattern/deviations: Step-through pattern, Decreased stride length, Scissoring, Ataxic, Staggering left   Gait velocity interpretation: <1.8 ft/sec, indicate of risk for recurrent falls   General Gait Details: .gait unsteady overall with wide BOS, staggering L at times, 1 episode of uncontrolled falling L when opening eyes from period of closed.  Worsening with fatigue and need for 2 people to be more hands on  Stairs            Wheelchair Mobility     Tilt Bed    Modified Rankin (Stroke Patients Only) Modified Rankin (Stroke Patients Only) Pre-Morbid Rankin Score: No symptoms Modified Rankin: Moderately severe disability     Balance Overall balance assessment: Needs assistance Sitting-balance support: Single extremity supported, No upper extremity supported, Feet supported Sitting balance-Leahy Scale: Poor (to poor) Sitting balance - Comments: could maintain sitting CGA for periods, but unable to walk away due to 1 moment later very unsteady needing min assist.   Standing balance support: Single extremity supported, During functional activity Standing balance-Leahy Scale: Poor Standing balance comment: external stability important presently                             Pertinent Vitals/Pain Pain Assessment  Pain Assessment: Faces Faces Pain Scale: No hurt Pain Intervention(s): Monitored during session    Home Living Family/patient expects to be discharged to:: Private residence Living Arrangements: Spouse/significant other Available Help at Discharge: Family;Available 24 hours/day Type of Home: House Home  Access: Stairs to enter Entrance Stairs-Rails: Can reach both Entrance Stairs-Number of Steps: 4 Alternate Level Stairs-Number of Steps: full flight Home Layout: Two level Home Equipment: None Additional Comments: works emergency planning/management officer, practicing therapist, 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    Prior Function Prior Level of Function : Independent/Modified Independent;Working/employed;Driving Civil Engineer, Contracting therapy/therapist)                     Extremity/Trunk Assessment   Upper Extremity Assessment Upper Extremity Assessment: Defer to OT evaluation    Lower Extremity Assessment Lower Extremity Assessment: Generalized weakness (bil mild general incoordination separate, but worsened by visual disturbance.)    Cervical / Trunk Assessment Cervical / Trunk Assessment: Normal  Communication   Communication Communication: No apparent difficulties Cueing Techniques: Verbal cues  Cognition Arousal: Alert, Lethargic Behavior During Therapy: WFL for tasks assessed/performed, Flat affect                                            General Comments General comments (skin integrity, edema, etc.): Pt's wife witnessed session,  BP upper 150's /90.    Exercises     Assessment/Plan    PT Assessment Patient needs continued PT services  PT Problem List Decreased strength;Decreased activity tolerance;Decreased balance;Decreased mobility;Decreased coordination;Decreased knowledge of use of DME       PT Treatment Interventions DME instruction;Gait training;Stair training;Functional mobility training;Therapeutic activities;Balance training;Neuromuscular re-education;Patient/family education    PT Goals (Current goals can be found in the Care Plan section)  Acute Rehab PT Goals Patient Stated Goal: Independent PT Goal Formulation: With patient Time For Goal Achievement: 12/18/23 Potential to Achieve Goals: Good    Frequency Min 1X/week      Co-evaluation PT/OT/SLP Co-Evaluation/Treatment: Yes Reason for Co-Treatment: To address functional/ADL transfers;For patient/therapist safety PT goals addressed during session: Mobility/safety with mobility OT goals addressed during session: ADL's and self-care;Proper use of Adaptive equipment and DME;Strengthening/ROM       AM-PAC PT 6 Clicks Mobility  Outcome Measure Help needed turning from your back to your side while in a flat bed without using bedrails?: A Lot Help needed moving from lying on your back to sitting on the side of a flat bed without using bedrails?: A Lot Help needed moving to and from a bed to a chair (including a wheelchair)?: Total Help needed standing up from a chair using your arms (e.g., wheelchair or bedside chair)?: Total Help needed to walk in hospital room?: Total Help needed climbing 3-5 steps with a railing? : Total 6 Click Score: 8    End of Session   Activity Tolerance: Patient tolerated treatment well;Patient limited by fatigue Patient left: in bed;with call bell/phone within reach;with bed alarm set;with family/visitor present Nurse Communication: Mobility status PT Visit Diagnosis: Unsteadiness on feet (R26.81);Other abnormalities of gait and mobility (R26.89);Other symptoms and signs involving the nervous system (R29.898)    Time: 1230-1306 PT Time Calculation (min) (ACUTE ONLY): 36 min   Charges:   PT Evaluation $PT Eval Moderate Complexity: 1 Mod   PT General Charges $$ ACUTE PT VISIT: 1 Visit  12/18/2023  India HERO., PT Acute Rehabilitation Services [phone]  (office)  Vinie GAILS Maiana Hennigan 12/18/2023, 4:02 PM

## 2023-12-18 NOTE — Evaluation (Addendum)
 Occupational Therapy Evaluation Patient Details Name: Wayne Lee MRN: [id number] DOB: [birth year] Today's Date: 12/18/2023   History of Present Illness 48 yo male arrival 1/6 with HTN 204/106 taken to emergent OR hematoma evacuation s/p suboccipital craniotomy and hypertonic saline started. Extubated 1/7 PMH obesity, sleep apnea, HLD   Clinical Impression   Patient is s/p craniotomy surgery resulting in functional limitations due to the deficits listed below (see OT problem list). Pt at baseline indep and working full time driving and parenting two small children ( 8 and 1 yo). Pt at current demonstrates vision occlusion due to diplopia and light sensitivity. Pt with L UE weakness and (A) for all transfers due to balance deficits.  Patient will benefit from skilled OT acutely to increase independence and safety with ADLS to allow discharge Patient will benefit from intensive inpatient follow up therapy, >3 hours/day .        If plan is discharge home, recommend the following: Two people to help with walking and/or transfers;Two people to help with bathing/dressing/bathroom    Functional Status Assessment  Patient has had a recent decline in their functional status and demonstrates the ability to make significant improvements in function in a reasonable and predictable amount of time.  Equipment Recommendations  BSC/3in1;Wheelchair (measurements OT);Wheelchair cushion (measurements OT)    Recommendations for Other Services Rehab consult     Precautions / Restrictions Precautions Precautions: Fall Precaution Comments: watch BP Restrictions Weight Bearing Restrictions Per Provider Order: No      Mobility Bed Mobility Overal bed mobility: Needs Assistance Bed Mobility: Supine to Sit, Sit to Supine     Supine to sit: Min assist, +2 for physical assistance     General bed mobility comments: initial struggle bring weight from supine to L side needing light min assist.  Once up  balance was variable and unsteady with pt staying upright with effort.    Transfers Overall transfer level: Needs assistance Equipment used: Sliding board Transfers: Sit to/from Stand Sit to Stand: Min assist, Mod assist           General transfer comment: cues for hand placement, min stability assist with some forward assist, but no need for boost.      Balance Overall balance assessment: Needs assistance Sitting-balance support: Single extremity supported, No upper extremity supported, Feet supported Sitting balance-Leahy Scale: Poor (to poor) Sitting balance - Comments: could maintain sitting CGA for periods, but unable to walk away due to 1 moment later very unsteady needing min assist.pt sitting with LOB in all planes   Standing balance support: Single extremity supported, During functional activity Standing balance-Leahy Scale: Poor Standing balance comment: external stability important presently               High Level Balance Comments: pt with ambulation opening eyes and immediate LOB toward the L side and required max -total (A) to come back to midline           ADL either performed or assessed with clinical judgement   ADL Overall ADL's : Needs assistance/impaired Eating/Feeding: Minimal assistance   Grooming: Minimal assistance               Lower Body Dressing: Maximal assistance Lower Body Dressing Details (indicate cue type and reason): pt (A) with hand over hand to get sock in hands. pt with total (A) to place R LE in figure 4 cross. pt reaching with L UE to pull sock over heel that was backward chained for the patient Toilet  Transfer: +2 for physical assistance;Moderate assistance;Cueing for safety;Regular Toilet;Grab bars (standing)   Toileting- Clothing Manipulation and Hygiene: Maximal assistance       Functional mobility during ADLs: +2 for physical assistance;+2 for safety/equipment;Moderate assistance;Cueing for sequencing;Cueing for  safety;Rolling walker (2 wheels) General ADL Comments: wife present entire session     Vision Baseline Vision/History: 0 No visual deficits Ability to See in Adequate Light: 1 Impaired Patient Visual Report: Diplopia Vision Assessment?: Vision impaired- to be further tested in functional context Additional Comments: pt noted to have diplopia in all visual fields with testing. pt closing eyes due to light sensitivity and limiting testing. pt could benefit from sunglasses and wife made aware. due to occlusion of vision pt does not currently benefit from taping. pt with visual attention with increased LOB. pt describes as one on top of the other     Perception         Praxis         Pertinent Vitals/Pain Pain Assessment Faces Pain Scale: No hurt Pain Intervention(s): Monitored during session     Extremity/Trunk Assessment Upper Extremity Assessment Upper Extremity Assessment: Right hand dominant;LUE deficits/detail LUE Deficits / Details: decrease grasp with RW noted during functional task LUE Sensation: decreased light touch LUE Coordination: decreased fine motor;decreased gross motor   Lower Extremity Assessment Lower Extremity Assessment: Defer to PT evaluation   Cervical / Trunk Assessment Cervical / Trunk Assessment: Normal   Communication Communication Communication: No apparent difficulties Cueing Techniques: Verbal cues   Cognition Arousal: Alert, Lethargic Behavior During Therapy: WFL for tasks assessed/performed, Flat affect Overall Cognitive Status: Impaired/Different from baseline                                 General Comments: increased time to respond , clue for sustain attention     General Comments  VSS on RA, fatigued and requesting to return to supine and immediately going to sleep. pt voiding bladder and RN notified but volume not captured but > 200 CC    Exercises     Shoulder Instructions      Home Living Family/patient  expects to be discharged to:: Private residence Living Arrangements: Spouse/significant other Available Help at Discharge: Family;Available 24 hours/day Type of Home: House Home Access: Stairs to enter [employer] of Steps: 4 Entrance Stairs-Rails: Can reach both Home Layout: Two level Alternate Level Stairs-Number of Steps: full flight   Bathroom Shower/Tub: Tub/shower unit (likes tub)   Bathroom Toilet: Standard     Home Equipment: None   Additional Comments: works emergency planning/management officer, practicing therapist, 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  Lives With: Spouse    Prior Functioning/Environment Prior Level of Function : Independent/Modified Independent;Working/employed;Driving Civil Engineer, Contracting therapy/therapist)                        OT Problem List: Decreased strength;Decreased activity tolerance;Impaired balance (sitting and/or standing);Decreased cognition;Decreased safety awareness;Decreased knowledge of use of DME or AE;Decreased knowledge of precautions;Cardiopulmonary status limiting activity;Impaired vision/perception;Decreased coordination;Obesity;Impaired UE functional use      OT Treatment/Interventions: Self-care/ADL training;Therapeutic exercise;Neuromuscular education;Energy conservation;DME and/or AE instruction;Manual therapy;Modalities;Therapeutic activities;Cognitive remediation/compensation;Visual/perceptual remediation/compensation;Balance training;Patient/family education    OT Goals(Current goals can be found in the care plan section) Acute Rehab OT Goals Patient Stated Goal: to get better OT Goal Formulation: With patient/family Time For Goal Achievement: 01/01/24 Potential to Achieve Goals: Good ADL Goals Pt Will  Perform Grooming: with set-up;sitting Pt Will Perform Upper Body Bathing: with set-up;sitting Pt Will Perform Lower Body Bathing: with min assist;sit to/from stand;with adaptive equipment Pt Will Transfer to  Toilet: ambulating;with mod assist;bedside commode Additional ADL Goal #1: pt will demonstrate gaze stabilization with visual attention for 10 seconds  OT Frequency: Min 1X/week    Co-evaluation PT/OT/SLP Co-Evaluation/Treatment: Yes Reason for Co-Treatment: To address functional/ADL transfers;For patient/therapist safety PT goals addressed during session: Mobility/safety with mobility OT goals addressed during session: ADL's and self-care;Proper use of Adaptive equipment and DME;Strengthening/ROM      AM-PAC OT 6 Clicks Daily Activity     Outcome Measure Help from another person eating meals?: A Little Help from another person taking care of personal grooming?: A Little Help from another person toileting, which includes using toliet, bedpan, or urinal?: A Lot Help from another person bathing (including washing, rinsing, drying)?: A Lot Help from another person to put on and taking off regular upper body clothing?: A Lot Help from another person to put on and taking off regular lower body clothing?: A Lot 6 Click Score: 14   End of Session Equipment Utilized During Treatment: Rolling walker (2 wheels) Nurse Communication: Mobility status;Precautions  Activity Tolerance: Patient tolerated treatment well Patient left: in bed;with call bell/phone within reach;with bed alarm set;with nursing/sitter in room  OT Visit Diagnosis: Unsteadiness on feet (R26.81);Muscle weakness (generalized) (M62.81)                Time: 1230-1306 OT Time Calculation (min): 36 min Charges:  OT General Charges $OT Visit: 1 Visit OT Evaluation $OT Eval Moderate Complexity: 1 Mod   Brynn, OTR/L  Acute Rehabilitation Services Office: [phone] .   Ely Molt 12/18/2023, 6:08 PM

## 2023-12-18 NOTE — Progress Notes (Signed)
 NAME:  Aspen Deterding, MRN:  [id number], DOB:  02/07/1976, LOS: 2 ADMISSION DATE:  12/16/2023, CONSULTATION DATE:  12/15/22 REFERRING MD:  Dr. Colon, CHIEF COMPLAINT:  postop   History of Present Illness:  Pt encephalopathic, therefore HPI obtained from medical chart review.   35yoM with PMH of HLD, BMI 38, and possible OSA who presented after developing headache, dizziness, 2 episodes of emesis, and left sided weakness, LSW 2300.  Code stroke on arrival.  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  CTA head/ neck negative for LVO, moderate right P2 PCA stenosis, no aneurysm or vascular malformation identified but limited assessment due to arterial timing.  NSGY consulted. Given prior vomiting episodes and decreased mental status, pt intubated for airway protection.  SBP's 140-220 in ER.  Pt taken emergently to OR by Dr. Colon for suboccipital craniectomy with decompression of cerebellar hemorrhage.  Pt returns to ICU sedated on mechanical ventilation.  PCCM consulted for medical and ventilator management in ICU.   Pertinent  Medical History  Obesity, HLD, ?OSA No tobacco, ETOH or reported drug use  Significant Hospital Events: Including procedures, antibiotic start and stop dates in addition to other pertinent events   1/6 Admit, L cerebellar ICH w/ IVH s/p suboccipital crani 1/7 MRI brain stable/ improved, extubated, remains febrile, neg PCT, HTS bolus given   Interim History / Subjective:  Extubated 1/7, doing well.  Denies SOB, intermittently on O2 overnight but off.   Remains non focal and oriented but still not as alert per wife C/o of soreness at incision site but otherwise denies HA.  C/o of mild left hip pain- asking his wife if he fell pta (wife denies) Remains intermittently febrile, PCT 0.12> 0.14 CTH stable  overnight  Objective   Blood pressure (!) 123/102, pulse 91, temperature 99.4 F (37.4 C), temperature source Axillary, resp. rate (!) 22, height 5' 8 (1.727 m), weight 113.6 kg, SpO2 99%.        Intake/Output Summary (Last 24 hours) at 12/18/2023 0945 Last data filed at 12/18/2023 0600 Gross per 24 hour  Intake 1539.42 ml  Output 1475 ml  Net 64.42 ml   Filed Weights   12/16/23 0148  Weight: 113.6 kg   Examination: General:  Adult male lying in bed in NAD HEENT: MM pink/moist, pupils 3/r, some matting eyes L>R but clear, not injected Neuro: awakens to verbal, oriented x 3, MAE- slightly weaker in RLE but still able to hold to gravity CV: rr, NSR PULM:  non labored, clear anteriorly, few scattered crackles in right base.  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 Colon - non traumatic, suspected due to underlying hypertension - ICH score 2 HTN- not on pta meds - TTE 1/6,  60-70%, G1DD P - per Neurology and NSGY - SBP goal 130-150, remains within goal.  Norvasc   restarted today - HTS per neurology.  S/p 23.4% bolus 1/7 as not within goal.  Remains on 62ml/hr.  ? Cerebral salt wasting syndrome.  Unmeasured urinary occurrences since foley removal with leaking purwick.  Check CVP/ UA.  Trending Na q6 - empiric keppra  per NSGY - serial neuro exams - neuro protective measures  - further imaging per NSGY/ Neurology or if any significant changes - PT/ OT - SCDs for VTE ppx for now, ?add  after 48hrs if ok w/NSGY   Acute respiratory insufficiency, resolved  Concern for aspiration pneumonitis/ PNA  - emesis w/AMS pta.  No obvious initial infiltrates.  Has improved clinically from resp standpoint, extubated 1/7, on RA, no productive cough.  PCT unimpressive.  WBC down. Few scattered rales in right base, could be atelectasis.  Will check CXR, ?central fevers - goal sat SpO2 >92%  - pulm hygiene- IS, mobilize with PT/ OT - advancing diet, passed swallow screen   Leukocytosis/ fever - still having fevers, WBC down.  Rechecking CXR today  Consider doppler studies vs central fever.  Continue CVL for HTS - continue to monitor clinically, trending PCT, CBC   Hypokalemia, resolved -  trend on BMET, goal K > 4, Mag> 2   HLD - lipid panel HDL 29, LDL 81, triglycerides 149, statin to be considered at discharge   Elevated AST/ ALT - improved.  Trend periodically   Hyperglycemia - A1c 6.1 - trend on BMET   Obesity, BMI 38 - outpt f/u, education when appropriate    Best Practice (right click and Reselect all SmartList Selections daily)   Diet/type: Regular consistency (see orders) DVT prophylaxis SCD Pressure ulcer(s): N/A GI prophylaxis: N/A Lines: Central line and yes and it is still needed Foley:  N/A Code Status:  full code Last date of multidisciplinary goals of care discussion [pending]  Wife updated at bedside 1/8 am  Labs   CBC: Recent Labs  Lab 12/16/23 0136 12/16/23 0141 12/16/23 0502 12/16/23 0718 12/16/23 0748 12/17/23 0617 12/18/23 0524  WBC 9.3  --   --  4.8  --  11.7* 10.8*  NEUTROABS 1.8  --   --   --   --   --   --   HGB 14.7   < > 12.9* 11.9* 11.9* 12.2* 11.6*  HCT 46.0   < > 38.0* 36.3* 35.0* 38.6* 36.6*  MCV 86.1  --   --  83.6  --  86.0 86.5  PLT 297  --   --  226  --  242 208   < > = values in this interval not displayed.    Basic Metabolic Panel: Recent Labs  Lab 12/16/23 0136 12/16/23 0141 12/16/23 0145  12/16/23 0502 12/16/23 9281 12/16/23 0748 12/16/23 1438 12/17/23 0617 12/17/23 0815 12/17/23 1423 12/17/23 2022 12/18/23 0138 12/18/23 0521 12/18/23 0524 12/18/23 0804  NA 138 141   < > 141 141 143   < > 142   < > 144 144 142  --  143 142  K 3.1* 3.2*  --  4.4 4.0 4.0  --  4.2  --   --   --   --   --  3.7  --   CL 102 104  --   --  110  --   --  108  --   --   --   --   --  110  --   CO2 22  --   --   --  20*  --   --  25  --   --   --   --   --  25  --   GLUCOSE 160* 160*  --   --  140*  --   --  134*  --   --   --   --   --  113*  --   BUN 14 17  --   --  11  --   --  9  --   --   --   --   --  10  --   CREATININE 1.18 1.20  --   --  1.01  --   --  0.99  --   --   --   --   --  0.91  --   CALCIUM  9.5  --   --   --  8.6*  --   --  8.9  --   --   --   --   --  8.7*  --   MG  --   --   --   --  1.8  --   --   --   --   --   --   --  2.3  --   --    < > = values in this interval not displayed.   GFR: Estimated Creatinine Clearance: 124.1 mL/min (by C-G formula based on SCr of 0.91 mg/dL). Recent Labs  Lab 12/16/23 0136 12/16/23 0718 12/17/23 0617 12/17/23 0815 12/18/23 0524  PROCALCITON  --   --   --  0.12 0.14  WBC 9.3 4.8 11.7*  --  10.8*    Liver Function Tests: Recent Labs  Lab 12/16/23 0136 12/17/23 0617  AST 51* 38  ALT 76* 54*  ALKPHOS 98 73  BILITOT 0.7 0.5  PROT 7.4 6.3*  ALBUMIN  4.0 3.2*   No results for input(s): LIPASE, AMYLASE in the last 168 hours. No results for input(s): AMMONIA in the last 168 hours.  ABG    Component Value Date/Time   PHART 7.360 12/16/2023 0748   PCO2ART 37.5 12/16/2023 0748   PO2ART 134 (H) 12/16/2023 0748   HCO3 21.6 12/16/2023 0748   TCO2 23 12/16/2023 0748   ACIDBASEDEF 4.0 (H) 12/16/2023 0748   O2SAT 99 12/16/2023 0748     Coagulation Profile: Recent Labs  Lab 12/16/23 0136  INR 1.1    Cardiac Enzymes: No results for input(s): CKTOTAL, CKMB, CKMBINDEX, TROPONINI in the last 168  hours.  HbA1C: Hgb A1c MFr Bld  Date/Time Value Ref Range Status  12/16/2023 07:18 AM 6.1 (H) 4.8 - 5.6 % Final    Comment:    (NOTE)         Prediabetes: 5.7 - 6.4         Diabetes: >6.4         Glycemic control for adults with diabetes: <7.0     CBG: Recent Labs  Lab 12/17/23 1527 12/17/23 1941 12/17/23 2344 12/18/23 0336 12/18/23 0741  GLUCAP 130* 150* 155* 125* 153*     Allergies Allergies  Allergen Reactions   Apple Other (See Comments) and Nausea And Vomiting    RED ONLY   Corn-Containing Products Other (See Comments) and Nausea And Vomiting   Penicillins Hives and Swelling     Home Medications  Prior to Admission medications   Medication Sig Start Date End Date Taking? Authorizing Provider  cyclobenzaprine  (FLEXERIL ) 10 MG tablet Take 1 tablet (10 mg total) by mouth 3 (three) times daily as  needed for muscle spasms. 07/27/21   Vicky Charleston, PA-C  ibuprofen  (ADVIL ) 800 MG tablet Take 1 tablet (800 mg total) by mouth 3 (three) times daily. 07/27/21   Vicky Charleston, PA-C     Critical care time: 35 mins       Lyle Pesa, MSN, AG-ACNP-BC [facility] 12/18/2023, 9:45 AM  See Amion for pager If no response to pager , please call [phone] until 7pm After 7:00 pm call Elink  [phone]

## 2023-12-19 DIAGNOSIS — I629 Nontraumatic intracranial hemorrhage, unspecified: Secondary | ICD-10-CM

## 2023-12-19 DIAGNOSIS — R270 Ataxia, unspecified: Secondary | ICD-10-CM

## 2023-12-19 DIAGNOSIS — I619 Nontraumatic intracerebral hemorrhage, unspecified: Secondary | ICD-10-CM | POA: Diagnosis not present

## 2023-12-19 DIAGNOSIS — G935 Compression of brain: Secondary | ICD-10-CM

## 2023-12-19 DIAGNOSIS — I10 Essential (primary) hypertension: Secondary | ICD-10-CM

## 2023-12-19 LAB — BASIC METABOLIC PANEL
Anion gap: 5 (ref 5–15)
Anion gap: 8 (ref 5–15)
BUN: 7 mg/dL (ref 6–20)
BUN: 9 mg/dL (ref 6–20)
CO2: 25 mmol/L (ref 22–32)
CO2: 25 mmol/L (ref 22–32)
Calcium: 8.8 mg/dL — ABNORMAL LOW (ref 8.9–10.3)
Calcium: 9.5 mg/dL (ref 8.9–10.3)
Chloride: 108 mmol/L (ref 98–111)
Chloride: 104 mmol/L (ref 98–111)
Creatinine, Ser: 1.01 mg/dL (ref 0.61–1.24)
Creatinine, Ser: 0.88 mg/dL (ref 0.61–1.24)
GFR, Estimated: >60 mL/min (ref >60)
GFR, Estimated: >60 mL/min (ref >60)
Glucose, Bld: 163 mg/dL — ABNORMAL HIGH (ref 70–99)
Glucose, Bld: 130 mg/dL — ABNORMAL HIGH (ref 70–99)
Potassium: 3.5 mmol/L (ref 3.5–5.1)
Potassium: 3.7 mmol/L (ref 3.5–5.1)
Sodium: 138 mmol/L (ref 135–145)
Sodium: 137 mmol/L (ref 135–145)

## 2023-12-19 LAB — CBC
HCT: 37.6 % — ABNORMAL LOW (ref 39.0–52.0)
Hemoglobin: 12 g/dL — ABNORMAL LOW (ref 13.0–17.0)
MCH: 27.2 pg (ref 26.0–34.0)
MCHC: 31.9 g/dL (ref 30.0–36.0)
MCV: 85.3 fL (ref 80.0–100.0)
Platelets: 207 10*3/uL (ref 150–400)
RBC: 4.41 MIL/uL (ref 4.22–5.81)
RDW: 13 % (ref 11.5–15.5)
WBC: 9.4 10*3/uL (ref 4.0–10.5)
nRBC: 0 % (ref 0.0–0.2)

## 2023-12-19 LAB — GLUCOSE, CAPILLARY
Glucose-Capillary: 105 mg/dL — ABNORMAL HIGH (ref 70–99)
Glucose-Capillary: 109 mg/dL — ABNORMAL HIGH (ref 70–99)
Glucose-Capillary: 113 mg/dL — ABNORMAL HIGH (ref 70–99)
Glucose-Capillary: 148 mg/dL — ABNORMAL HIGH (ref 70–99)
Glucose-Capillary: 93 mg/dL (ref 70–99)
Glucose-Capillary: 95 mg/dL (ref 70–99)

## 2023-12-19 LAB — SODIUM: Sodium: 141 mmol/L (ref 135–145)

## 2023-12-19 MED ORDER — ENOXAPARIN SODIUM 40 MG/0.4ML IJ SOSY
40.0000 mg | PREFILLED_SYRINGE | INTRAMUSCULAR | Status: DC
  Administered 2023-12-19: 40 mg via SUBCUTANEOUS
  Filled 2023-12-19 (×2): qty 0.4

## 2023-12-19 MED ORDER — POTASSIUM CHLORIDE CRYS ER 20 MEQ PO TBCR
40.0000 meq | EXTENDED_RELEASE_TABLET | Freq: Once | ORAL | Status: AC
  Administered 2023-12-19: 40 meq via ORAL
  Filled 2023-12-19: qty 2

## 2023-12-19 MED ORDER — ATORVASTATIN CALCIUM 10 MG PO TABS
20.0000 mg | ORAL_TABLET | Freq: Every day | ORAL | Status: DC
Start: 2023-12-19 — End: 2023-12-20
  Administered 2023-12-19 – 2023-12-20 (×2): 20 mg via ORAL
  Filled 2023-12-19 (×2): qty 2

## 2023-12-19 MED ORDER — TRAMADOL HCL 50 MG PO TABS
50.0000 mg | ORAL_TABLET | Freq: Once | ORAL | Status: AC
  Administered 2023-12-19: 50 mg via ORAL
  Filled 2023-12-19: qty 1

## 2023-12-19 MED ORDER — ACETAMINOPHEN 325 MG PO TABS
650.0000 mg | ORAL_TABLET | ORAL | Status: DC | PRN
  Administered 2023-12-19 – 2023-12-20 (×5): 650 mg via ORAL
  Filled 2023-12-19 (×5): qty 2

## 2023-12-19 NOTE — Progress Notes (Signed)
  Inpatient Rehabilitation Admissions Coordinator   Met with patient and spouse at bedside for rehab assessment. We discussed goals and expectations of a possible CIR admit. They prefer CIR for rehab. Family can provide expected caregiver support that is recommended. I will begin insurance Auth with [place] state plan policy [phone] for possible CIR admit pending approval. I will also let admitting know they have him listed under wrong payor. Please call me with any questions.   Heron Leavell, RN, MSN Rehab Admissions Coordinator [phone]

## 2023-12-19 NOTE — Consult Note (Signed)
 Physical Medicine and Rehabilitation Consult Reason for Consult: Impaired balance and functional mobility Referring Physician: Colon   HPI: Wayne Lee is a 48 y.o. male with a history of obesity and sleep apnea who presented on 12/15/2022 with severe headache and dizziness.  Blood pressure upon presentation was 204/106.  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  Basal cisterns were effaced with early ascending transtentorial herniation.  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  The patient was evaluated by neurosurgery and underwent a suboccipital craniectomy with decompression of the cerebellar hemorrhage by Dr. [name].  Patient was extubated the next day and placed on hypertonic saline to manage cerebellar edema.  He was started on a full liquid diet after bedside swallow screen.  He reported postoperative headaches and pain at the surgical site.  Patient with a temperature yesterday of 101.3 but demonstrated improving arousal and cognition.  Patient was up with therapies yesterday and was min assist for sit to stand transfers and bed mobility.  He is able to walk 12 feet at a mod assist level using a rolling walker and +2 assist for safety.  Gait reported is very unsteady with staggering movements to the left at times and wide base of support.  Patient fatigued fairly easily on evaluation.  Patient lives at home with his spouse in a two-level home with 4 steps to enter.  It appears that his bathroom is on the second floor.  Patient was independent and working prior to this presentation.   Review of Systems  Constitutional: Negative.   HENT: Negative.    Eyes:  Positive for double vision.  Respiratory: Negative.    Cardiovascular: Negative.   Gastrointestinal:  Positive for nausea.  Genitourinary: Negative.    Musculoskeletal: Negative.   Neurological:  Positive for dizziness, tremors, focal weakness and headaches.  Psychiatric/Behavioral: Negative.     History reviewed. No pertinent past medical history. Past Surgical History:  Procedure Laterality Date   SUBOCCIPITAL CRANIECTOMY CERVICAL LAMINECTOMY N/A 12/16/2023   Procedure: SUBOCCIPITAL CRANIECTOMY;  Surgeon: Colon Victory, MD;  Location: [facility] OR;  Service: Neurosurgery;  Laterality: N/A;   History reviewed. No pertinent family history. Social History:  has no history on file for tobacco use, alcohol  use, and drug use. Allergies:  Allergies  Allergen Reactions   Apple Other (See Comments) and Nausea And Vomiting    RED ONLY   Corn-Containing Products Other (See Comments) and Nausea And Vomiting   Penicillins Hives and Swelling   Medications Prior to Admission  Medication Sig Dispense Refill   acetaminophen  (TYLENOL ) 500 MG tablet Take 500-1,000 mg by mouth every 6 (six) hours as needed for moderate pain (pain score 4-6).     DM-Doxylamine-Acetaminophen  (NYQUIL COLD & FLU PO) Take 1 Dose by mouth every 6 (six) hours as needed (Cold/cough sx).     ibuprofen  (ADVIL ) 200 MG tablet Take 400 mg by mouth every 6 (six) hours as needed for moderate pain (pain score 4-6).     triamcinolone  (KENALOG ) 0.025 % ointment Apply 1 Application topically 2 (two) times daily as needed (Dermatitis).      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  Once up balance was variable and unsteady with pt staying upright with effort. Transfers Overall transfer level: Needs assistance Equipment used: Sliding board Transfers: Sit to/from Stand Sit to Stand: Min assist, Mod assist General transfer comment: cues for hand placement, min stability assist with some forward assist, but no need for boost. Ambulation/Gait Ambulation/Gait assistance: Mod assist, +2 safety/equipment Gait Distance (Feet): 12 Feet (to bathroom and 70 into the hallway and back) Assistive device: Rolling walker (2 wheels), 1 person hand held assist Gait Pattern/deviations: Step-through pattern, Decreased stride length, Scissoring, Ataxic, Staggering left General Gait Details: .gait unsteady overall with wide BOS, staggering L at times, 1 episode of uncontrolled falling L when opening eyes from period of closed.  Worsening with fatigue and need for 2 people to be more hands on Gait velocity interpretation: <1.8 ft/sec, indicate of risk for recurrent falls    ADL: ADL Overall ADL's : Needs assistance/impaired Eating/Feeding: Minimal assistance Grooming: Minimal assistance Lower Body Dressing: Maximal assistance Lower Body Dressing Details (indicate cue type and reason): pt (A) with hand over hand to get sock in hands. pt with total (A) to place R LE in figure 4 cross. pt reaching with L UE to pull sock over heel that was backward chained for the  patient Toilet Transfer: +2 for physical assistance, Moderate assistance, Cueing for safety, Regular Toilet, Grab bars (standing) Toileting- Clothing Manipulation and Hygiene: Maximal assistance Functional mobility during ADLs: +2 for physical assistance, +2 for safety/equipment, Moderate assistance, Cueing for sequencing, Cueing for safety, Rolling walker (2 wheels) General ADL Comments: wife present entire session  Cognition: Cognition Overall Cognitive Status: Impaired/Different from baseline Arousal/Alertness: Awake/alert Orientation Level: Oriented X4 Attention: Sustained Sustained Attention: Impaired Sustained Attention Impairment: Verbal basic, Functional basic Memory: Impaired Memory Impairment: Retrieval deficit Awareness: Impaired Awareness Impairment: Emergent impairment Problem Solving: Impaired Problem Solving Impairment: Verbal basic Cognition Arousal: Alert, Lethargic Behavior During Therapy: WFL for tasks assessed/performed, Flat affect Overall Cognitive Status: Impaired/Different from baseline General Comments: increased time to respond , clue for sustain attention  Blood pressure (!) 141/93, pulse 63, temperature 98.6 F (37 C), temperature source Axillary, resp. rate 16, height 5' 8 (1.727 m), weight 113.6 kg, SpO2 96%. Physical Exam Constitutional:      Appearance: He is obese.  HENT:     Head:     Comments: Suboccipital crani site dry with old blood, intact. Honeycomb dressing in place.    Nose: Nose normal.  Eyes:     Extraocular Movements: Extraocular movements intact.     Conjunctiva/sclera: Conjunctivae normal.  Cardiovascular:     Rate and Rhythm: Normal rate.  Pulmonary:     Effort: Pulmonary effort is normal.  Abdominal:     Palpations: Abdomen is soft.  Musculoskeletal:        General: No swelling or tenderness.  Skin:  General: Skin is warm and dry.  Neurological:     Comments: Pt fairly alert. Tended to keep eyes open. Was eating  jello while lying on right side. Oriented to person, place, time, reason he was here. No focal CN findings. No diplopia with visual tracking. Moved all 4 limbs at least 3/5. No focal sensory findings or abnormal tone appreciate. Difficult to test for limb ataxia today  Psychiatric:     Comments: Flat but overall pleasant and appropriate     Results for orders placed or performed during the hospital encounter of 12/16/23 (from the past 24 hours)  Glucose, capillary     Status: None   Collection Time: 12/18/23 12:01 PM  Result Value Ref Range   Glucose-Capillary 97 70 - 99 mg/dL  Sodium     Status: None   Collection Time: 12/18/23  1:15 PM  Result Value Ref Range   Sodium 142 135 - 145 mmol/L  Glucose, capillary     Status: None   Collection Time: 12/18/23  3:33 PM  Result Value Ref Range   Glucose-Capillary 95 70 - 99 mg/dL  Glucose, capillary     Status: Abnormal   Collection Time: 12/18/23  7:31 PM  Result Value Ref Range   Glucose-Capillary 142 (H) 70 - 99 mg/dL  Glucose, capillary     Status: None   Collection Time: 12/18/23 11:54 PM  Result Value Ref Range   Glucose-Capillary 88 70 - 99 mg/dL  CBC     Status: Abnormal   Collection Time: 12/19/23  2:45 AM  Result Value Ref Range   WBC 9.4 4.0 - 10.5 K/uL   RBC 4.41 4.22 - 5.81 MIL/uL   Hemoglobin 12.0 (L) 13.0 - 17.0 g/dL   HCT 62.3 (L) 60.9 - 47.9 %   MCV 85.3 80.0 - 100.0 fL   MCH 27.2 26.0 - 34.0 pg   MCHC 31.9 30.0 - 36.0 g/dL   RDW 86.9 88.4 - 84.4 %   Platelets 207 150 - 400 K/uL   nRBC 0.0 0.0 - 0.2 %  Basic metabolic panel     Status: Abnormal   Collection Time: 12/19/23  2:45 AM  Result Value Ref Range   Sodium 138 135 - 145 mmol/L   Potassium 3.5 3.5 - 5.1 mmol/L   Chloride 108 98 - 111 mmol/L   CO2 25 22 - 32 mmol/L   Glucose, Bld 163 (H) 70 - 99 mg/dL   BUN 7 6 - 20 mg/dL   Creatinine, Ser 8.98 0.61 - 1.24 mg/dL   Calcium  8.8 (L) 8.9 - 10.3 mg/dL   GFR, Estimated >39 >39 mL/min   Anion gap 5 5 - 15   Glucose, capillary     Status: Abnormal   Collection Time: 12/19/23  3:46 AM  Result Value Ref Range   Glucose-Capillary 148 (H) 70 - 99 mg/dL  Glucose, capillary     Status: Abnormal   Collection Time: 12/19/23  7:21 AM  Result Value Ref Range   Glucose-Capillary 109 (H) 70 - 99 mg/dL   DG Chest Port 1 View Result Date: 12/18/2023 CLINICAL DATA:  Fever. EXAM: PORTABLE CHEST 1 VIEW COMPARISON:  December 16, 2023. FINDINGS: Endotracheal and nasogastric tubes have been removed. Right internal jugular catheter is unchanged. Lungs are clear. Bony thorax is unremarkable. IMPRESSION: No active disease. Electronically Signed   By: Lynwood Landy Raddle M.D.   On: 12/18/2023 13:50   CT HEAD WO CONTRAST ( ) Result Date: 12/18/2023 CLINICAL DATA:  48 year old male code stroke presentation on 12/16/2023 with large cerebellar hemorrhage. Status post craniectomy and hematoma evacuation. EXAM: CT HEAD WITHOUT CONTRAST TECHNIQUE: Contiguous axial images were obtained from the base of the skull through the vertex without intravenous contrast. RADIATION DOSE REDUCTION: This exam was performed according to the departmental dose-optimization program which includes automated exposure control, adjustment of the mA and/or kV according to patient size and/or use of iterative reconstruction technique. COMPARISON:  Postoperative head CT 12/16/2023.  Brain MRI yesterday. FINDINGS: Brain: Postoperative changes to the posterior cerebellum near the midline with stable small volume of patchy residual hyperdense hemorrhage (series 2, image 24), postoperative extra-axial fluid since 12/16/2023. Small volume pneumocephalus has regressed. Satisfactory basilar cistern and 4th ventricle patency now. Small volume mostly cerebellar folia subarachnoid blood. No ventriculomegaly. No convincing IVH. No supratentorial edema or mass effect. Supratentorial gray-white differentiation appears stable and negative. Vascular: No suspicious intracranial  vascular hyperdensity. Skull: Stable left midline suboccipital craniectomy. Sinuses/Orbits: Scattered mild to moderate paranasal sinus mucosal thickening. Tympanic cavities and mastoids remain clear. Other: Suboccipital craniectomy postoperative changes. Small volume of postoperative fluid/seroma overlying the surgical defect does not appear significantly changed. Elsewhere stable orbit and scalp soft tissues. IMPRESSION: 1. Regressed small volume pneumocephalus with otherwise unchanged postoperative appearance of the cerebellum since hematoma evacuation. Small volume residual hemorrhage. Satisfactory 4th ventricle and basilar cistern patency now. 2. No new intracranial abnormality. Electronically Signed   By: VEAR Hurst M.D.   On: 12/18/2023 06:25    Assessment/Plan: Diagnosis: 48 year old male with a large left cerebellar hemorrhage requiring emergent craniectomy Does the need for close, 24 hr/day medical supervision in concert with the patient's rehab needs make it unreasonable for this patient to be served in a less intensive setting? Yes Co-Morbidities requiring supervision/potential complications:  -Hypertension -Obstructive sleep apnea/morbid obesity -Dysphagia Due to bladder management, bowel management, safety, skin/wound care, disease management, medication administration, pain management, and patient education, does the patient require 24 hr/day rehab nursing? Yes Does the patient require coordinated care of a physician, rehab nurse, therapy disciplines of PT, OT, potentially SLP to address physical and functional deficits in the context of the above medical diagnosis(es)? Yes Addressing deficits in the following areas: balance, endurance, locomotion, strength, transferring, bowel/bladder control, bathing, dressing, feeding, grooming, toileting, cognition, swallowing, and psychosocial support Can the patient actively participate in an intensive therapy program of at least 3 hrs of therapy per  day at least 5 days per week? Yes The potential for patient to make measurable gains while on inpatient rehab is excellent Anticipated functional outcomes upon discharge from inpatient rehab are modified independent and supervision  with PT, modified independent and supervision with OT, modified independent with SLP. Estimated rehab length of stay to reach the above functional goals is: 9-14 dayys Anticipated discharge destination: Home Overall Rehab/Functional Prognosis: excellent  POST ACUTE RECOMMENDATIONS: This patient's condition is appropriate for continued rehabilitative care in the following setting: CIR Patient has agreed to participate in recommended program. Yes Note that insurance prior authorization may be required for reimbursement for recommended care.  Comment: Rehab Admissions Coordinator to follow up.     I have personally performed a face to face diagnostic evaluation of this patient. Additionally, I have examined the patient's medical record including any pertinent labs and radiographic images. If the physician assistant has documented in this note, I have reviewed and edited or otherwise concur with the physician assistant's documentation.  Thanks,  Arthea ONEIDA Gunther, MD 12/19/2023

## 2023-12-19 NOTE — Progress Notes (Signed)
 Physical Therapy Treatment Patient Details Name: Wayne Lee MRN: [id number] DOB: [birth year] Today's Date: 12/19/2023   History of Present Illness 48 yo male arrival 1/6 with HTN 204/106 taken to emergent OR hematoma evacuation s/p suboccipital craniotomy and hypertonic saline started. Extubated 1/7 PMH obesity, sleep apnea, HLD    PT Comments  Pt has improved from yesterday.  He is still showing significant balance problems with heavy lateral w/shifts/perturbations needing mod assist and the rail.  Overall, pt's balance and symptomology has improved.     If plan is discharge home, recommend the following: A little help with walking and/or transfers;A little help with bathing/dressing/bathroom;Assistance with cooking/housework;Assist for transportation;Help with stairs or ramp for entrance   Can travel by private vehicle        Equipment Recommendations  Other (comment) (TBD)    Recommendations for Other Services Rehab consult     Precautions / Restrictions Precautions Precautions: Fall Precaution Comments: watch BP     Mobility  Bed Mobility Overal bed mobility: Needs Assistance Bed Mobility: Supine to Sit, Sit to Supine     Supine to sit: Min assist, +2 for physical assistance     General bed mobility comments: initial struggle bring weight from supine to L side needing light min assist.  Once up balance was variable and unsteady with pt staying upright with effort.    Transfers Overall transfer level: Needs assistance Equipment used: Sliding board Transfers: Sit to/from Stand Sit to Stand: Min assist           General transfer comment: cues for hand placement, min stability assist with some forward assist, but no need for boost.    Ambulation/Gait Ambulation/Gait assistance: Mod assist Gait Distance (Feet): 300 Feet (with 3 standing or correction breaks) Assistive device: 1 person hand held assist Gait Pattern/deviations: Step-through pattern   Gait  velocity interpretation: <1.8 ft/sec, indicate of risk for recurrent falls   General Gait Details: .gait unsteady overall with less wide BOS, drifting L and R with moderate lateral perturbations  pt began heading for the rails midway through the gait cycle.  Worsening with fatigue.  Overall improved, but still a difficult assist.   Stairs             Wheelchair Mobility     Tilt Bed    Modified Rankin (Stroke Patients Only) Modified Rankin (Stroke Patients Only) Pre-Morbid Rankin Score: No symptoms     Balance Overall balance assessment: Needs assistance Sitting-balance support: Single extremity supported, No upper extremity supported, Feet supported Sitting balance-Leahy Scale: Poor (to poor) Sitting balance - Comments: could maintain sitting CGA for periods, but unable to walk away due to 1 moment later very unsteady needing min assist.   Standing balance support: Single extremity supported, During functional activity Standing balance-Leahy Scale: Poor Standing balance comment: external stability necessary during standing urination.                            Cognition Arousal: Alert, Lethargic Behavior During Therapy: WFL for tasks assessed/performed, Flat affect                                            Exercises      General Comments        Pertinent Vitals/Pain Pain Assessment Pain Assessment: Faces Faces Pain Scale: No hurt Pain Intervention(s): Monitored  during session    Home Living Family/patient expects to be discharged to:: Private residence Living Arrangements: Spouse/significant other Available Help at Discharge: Family;Available 24 hours/day Type of Home: House Home Access: Stairs to enter Entrance Stairs-Rails: Can reach both Entrance Stairs-Number of Steps: 4 Alternate Level Stairs-Number of Steps: full flight Home Layout: Two level Home Equipment: None Additional Comments: works aeronautical engineer, practicing therapist, 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    Prior Function            PT Goals (current goals can now be found in the care plan section) Acute Rehab PT Goals Patient Stated Goal: Independent PT Goal Formulation: With patient Time For Goal Achievement: 12/18/23 Potential to Achieve Goals: Good    Frequency    Min 1X/week      PT Plan      Co-evaluation              AM-PAC PT 6 Clicks Mobility   Outcome Measure  Help needed turning from your back to your side while in a flat bed without using bedrails?: A Lot Help needed moving from lying on your back to sitting on the side of a flat bed without using bedrails?: A Lot Help needed moving to and from a bed to a chair (including a wheelchair)?: Total Help needed standing up from a chair using your arms (e.g., wheelchair or bedside chair)?: Total Help needed to walk in hospital room?: Total Help needed climbing 3-5 steps with a railing? : Total 6 Click Score: 8    End of Session   Activity Tolerance: Patient tolerated treatment well;Patient limited by fatigue Patient left: in bed;with call bell/phone within reach;with bed alarm set;with family/visitor present Nurse Communication: Mobility status PT Visit Diagnosis: Unsteadiness on feet (R26.81);Other abnormalities of gait and mobility (R26.89);Other symptoms and signs involving the nervous system (R29.898)     Time: 8494-8472 PT Time Calculation (min) (ACUTE ONLY): 22 min  Charges:    $Gait Training: 8-22 mins PT General Charges $$ ACUTE PT VISIT: 1 Visit                     12/19/2023  India HERO., PT Acute Rehabilitation Services [phone]  (office)   Vinie GAILS Charissa Knowles 12/19/2023, 3:36 PM

## 2023-12-19 NOTE — Progress Notes (Signed)
 NAME:  Wayne Lee, MRN:  [id number], DOB:  March 26, 1976, LOS: 3 ADMISSION DATE:  12/16/2023, CONSULTATION DATE:  12/15/22 REFERRING MD:  Dr. Colon, CHIEF COMPLAINT:  postop   History of Present Illness:  Pt encephalopathic, therefore HPI obtained from medical chart review.   62yoM with PMH of HLD, BMI 38, and possible OSA who presented after developing headache, dizziness, 2 episodes of emesis, and left sided weakness, LSW 2300.  Code stroke on arrival.  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  CTA head/ neck negative for LVO, moderate right P2 PCA stenosis, no aneurysm or vascular malformation identified but limited assessment due to arterial timing.  NSGY consulted. Given prior vomiting episodes and decreased mental status, pt intubated for airway protection.  SBP's 140-220 in ER.  Pt taken emergently to OR by Dr. Colon for suboccipital craniectomy with decompression of cerebellar hemorrhage.  Pt returns to ICU sedated on mechanical ventilation.  PCCM consulted for medical and ventilator management in ICU.   Pertinent  Medical History  Obesity, HLD, ?OSA No tobacco, ETOH or reported drug use  Significant Hospital Events: Including procedures, antibiotic start and stop dates in addition to other pertinent events   1/6 Admit, L cerebellar ICH w/ IVH s/p suboccipital crani 1/7 MRI brain stable/ improved, extubated, remains febrile, neg PCT, HTS bolus given  1/8 weaning HTS  1/9 dc HTS   Interim History / Subjective:  NAEO   Remains on HTS but Na 138   Asking to use handheld urinal. Wife at bedside   Objective   Blood pressure 102/70, pulse 68, temperature 98.6 F (37 C), temperature source Axillary, resp. rate 17, height 5' 8 (1.727 m), weight 113.6 kg, SpO2 97%. CVP:  [12 mmHg-14 mmHg] 12 mmHg       Intake/Output Summary (Last 24 hours) at 12/19/2023 0925 Last data filed at 12/19/2023 0900 Gross per 24 hour  Intake 1300.84 ml  Output 1750 ml  Net -449.16 ml   Filed Weights   12/16/23 0148  Weight: 113.6 kg   Examination: General:  WDWN middle aged M NAD  HEENT: NCAT pink mm  Neuro: AAOx3 following commands  CV: rr s1s2 no rgm  PULM:  CTAb even unlabored  GI: soft round  Extremities: no acute joint deformity.  Skin: Psoriatic patches     Resolved Hospital Problem list   Acute resp failure  Hypokalemia  Assessment & Plan:   Acute L IPH with brain compression s/p suboccipital crani 1/6 ?CSW  -ICH score 2 -non-traumatic, suspect HTN etiology, not on meds at home  P -neuro and nsgy following appreciate recs -Has been subtherapeutic  with HTS, not sure that it is serving much benefit. Will d/w neuro re dc  -SBP goal 130-150  -empiric keppra  per nsgy -Add chemical vte ppx when ok w neuro/nsgy. Has been stable x48 hours, likely nearing readiness   HTN P -BP goal as above -norvasc    Fevers -infectious not favored. Possibly central. If recurs consider BLE US  r/o DVT  P -monitor off abx -needs CVC to cont for now w HTS -- dc CVC when off HTS   HLD P -consider statin at dc   Elevated AST/ ALT -follow PRN  Obesity, BMI 38 - outpt f/u, education when appropriate    If able to dc HTS, anticipate that he can likely transfer out of ICU.  Will d/w NSGY when they round  Best Practice (right click and Reselect all SmartList Selections daily)   Diet/type: Regular consistency (see orders) DVT prophylaxis SCD Pressure ulcer(s): N/A GI prophylaxis: N/A Lines: Central line and yes and it is still needed Foley:  N/A Code Status:  full code Last date of multidisciplinary goals of care discussion   Wife updated at bedside 1/9 am  Labs   CBC: Recent Labs  Lab 12/16/23 0136 12/16/23 0141 12/16/23 0718 12/16/23 0748 12/17/23 0617 12/18/23 0524  12/19/23 0245  WBC 9.3  --  4.8  --  11.7* 10.8* 9.4  NEUTROABS 1.8  --   --   --   --   --   --   HGB 14.7   < > 11.9* 11.9* 12.2* 11.6* 12.0*  HCT 46.0   < > 36.3* 35.0* 38.6* 36.6* 37.6*  MCV 86.1  --  83.6  --  86.0 86.5 85.3  PLT 297  --  226  --  242 208 207   < > = values in this interval not displayed.    Basic Metabolic Panel: Recent Labs  Lab 12/16/23 0136 12/16/23 0141 12/16/23 0145 12/16/23 9281 12/16/23 0748 12/16/23 1438 12/17/23 0617 12/17/23 0815 12/18/23 0138 12/18/23 0521 12/18/23 0524 12/18/23 0804 12/18/23 1315 12/19/23 0245  NA 138 141   < > 141 143   < > 142   < > 142  --  143 142 142 138  K 3.1* 3.2*   < > 4.0 4.0  --  4.2  --   --   --  3.7  --   --  3.5  CL 102 104  --  110  --   --  108  --   --   --  110  --   --  108  CO2 22  --   --  20*  --   --  25  --   --   --  25  --   --  25  GLUCOSE 160* 160*  --  140*  --   --  134*  --   --   --  113*  --   --  163*  BUN 14 17  --  11  --   --  9  --   --   --  10  --   --  7  CREATININE 1.18 1.20  --  1.01  --   --  0.99  --   --   --  0.91  --   --  1.01  CALCIUM  9.5  --   --  8.6*  --   --  8.9  --   --   --  8.7*  --   --  8.8*  MG  --   --   --  1.8  --   --   --   --   --  2.3  --   --   --   --    < > = values in this interval not displayed.   GFR: Estimated Creatinine Clearance: 111.8 mL/min (by C-G formula based on SCr of 1.01 mg/dL). Recent Labs  Lab 12/16/23 0718 12/17/23 0617 12/17/23 0815 12/18/23 0524 12/19/23 0245  PROCALCITON  --   --  0.12 0.14  --   WBC 4.8 11.7*  --  10.8* 9.4    Liver Function Tests: Recent Labs  Lab 12/16/23 0136 12/17/23 0617  AST 51* 38  ALT 76* 54*  ALKPHOS 98 73  BILITOT 0.7 0.5  PROT 7.4 6.3*  ALBUMIN  4.0 3.2*   No results for input(s): LIPASE, AMYLASE in the last 168 hours. No results for input(s): AMMONIA in the last 168 hours.  ABG    Component Value Date/Time   PHART 7.360 12/16/2023 0748   PCO2ART 37.5 12/16/2023 0748    PO2ART 134 (H) 12/16/2023 0748   HCO3 21.6 12/16/2023 0748   TCO2 23 12/16/2023 0748   ACIDBASEDEF 4.0 (H) 12/16/2023 0748   O2SAT 99 12/16/2023 0748     Coagulation Profile: Recent Labs  Lab 12/16/23 0136  INR 1.1    Cardiac Enzymes: No results for input(s): CKTOTAL, CKMB, CKMBINDEX, TROPONINI in the last 168 hours.  HbA1C: Hgb A1c MFr Bld  Date/Time Value Ref Range Status  12/16/2023 07:18 AM 6.1 (H) 4.8 - 5.6 % Final    Comment:    (NOTE)         Prediabetes: 5.7 - 6.4         Diabetes: >6.4         Glycemic control for adults with diabetes: <7.0     CBG: Recent Labs  Lab 12/18/23 1533 12/18/23 1931 12/18/23 2354 12/19/23 0346 12/19/23 0721  GLUCAP 95 142* 88 148* 109*     Allergies Allergies  Allergen Reactions   Apple Other (See Comments) and Nausea And Vomiting    RED ONLY   Corn-Containing Products Other (See Comments) and Nausea And Vomiting   Penicillins Hives and Swelling     CRITICAL CARE Performed by: Ronnald FORBES Gave   Total critical care time: 35 minutes  Critical care time was exclusive of separately billable procedures and treating other patients. Critical care was necessary to treat or prevent imminent or life-threatening deterioration.  Critical care was time spent personally by me on the following activities: development of treatment plan with patient and/or surrogate as well as nursing, discussions with consultants, evaluation of patient's response to treatment, examination of patient, obtaining history from patient or surrogate, ordering and performing treatments and interventions, ordering and review of laboratory studies, ordering and review of radiographic studies, pulse oximetry and re-evaluation of patient's condition.  Ronnald Gave MSN, AGACNP-BC [facility] Amion for pager  12/19/2023, 9:25 AM

## 2023-12-19 NOTE — Progress Notes (Addendum)
 STROKE TEAM PROGRESS NOTE   BRIEF HPI [name] is a 48 y.o. male with history of obesity, sleep apnea, and HLD presenting with headache and dizziness. BP on arrival 204/106. He was taken emergently to the OR for hematoma evacuation and hypertonic saline was started. Remained intubated post procedure. Extubated 12/17/2023  NIH on Admission 9   SIGNIFICANT HOSPITAL EVENTS 1/6- Admitted to 4N, s/p suboccipital craniotomy  1/7 - Extubated  INTERIM HISTORY/SUBJECTIVE Patient is seen in his room with his wife at the bedside.  He remains hemodynamically stable and has a Tmax of 101.3.  Neurological exam remains stable, and hypertonic saline was stopped.  OBJECTIVE  CBC    Component Value Date/Time   WBC 9.4 12/19/2023 0245   RBC 4.41 12/19/2023 0245   HGB 12.0 (L) 12/19/2023 0245   HCT 37.6 (L) 12/19/2023 0245   PLT 207 12/19/2023 0245   MCV 85.3 12/19/2023 0245   MCH 27.2 12/19/2023 0245   MCHC 31.9 12/19/2023 0245   RDW 13.0 12/19/2023 0245   LYMPHSABS 5.7 (H) 12/16/2023 0136   MONOABS 1.2 (H) 12/16/2023 0136   EOSABS 0.6 (H) 12/16/2023 0136   BASOSABS 0.0 12/16/2023 0136    BMET    Component Value Date/Time   NA 141 12/19/2023 0840   K 3.5 12/19/2023 0245   CL 108 12/19/2023 0245   CO2 25 12/19/2023 0245   GLUCOSE 163 (H) 12/19/2023 0245   BUN 7 12/19/2023 0245   CREATININE 1.01 12/19/2023 0245   CALCIUM  8.8 (L) 12/19/2023 0245   GFRNONAA >60 12/19/2023 0245    IMAGING past 24 hours No results found.   Vitals:   12/19/23 0900 12/19/23 1000 12/19/23 1100 12/19/23 1200  BP: 102/70 (!) 99/56 108/71 118/75  Pulse: 68 68 69 67  Resp: 17 20 19  (!) 21  Temp:    98.5 F (36.9 C)  TempSrc:    Oral  SpO2: 97% 96% 95% 92%  Weight:      Height:         PHYSICAL EXAM General:  Alert, well-nourished, well-developed patient in no acute distress Psych:  Mood and affect appropriate for situation CV: Regular rate and rhythm on monitor Respiratory:  Regular,  unlabored respirations on room air GI: Abdomen soft and nontender   NEURO:  Mental Status: Drowsy (more alert today than yesterday) but oriented to person place time and situation, able to follow simple and two-step commands Speech/Language: Speech is in single words and short phrases, voice somewhat slow  Cranial Nerves:  II: PERRL.  III, IV, VI: Extraocular movements intact, keeps eyes mostly closed unless prompted to open them V: Sensation is intact to light touch and symmetrical to face.  VII: Face is symmetrical resting and smiling VIII: hearing intact to voice. IX, X: Phonation is normal KP:Yzji is midline XII: tongue is midline  Motor: Moves all extremities antigravity with good strength Tone: is normal and bulk is normal Sensation- Withdraws to painful stimuli Coordination: Mild ataxia with finger-to-nose, left worse than right, improved today, heel-knee-shin intact Gait- deferred  ASSESSMENT/PLAN  Large cerebellar hemorrhage s/p craniectomy with hematoma evacuation Etiology: unclear, BP stable and no hx of HTN, but need to rule out cerebellar AVM Code Stroke CT head - Large acute 5.3 cm intraparenchymal hemorrhage in the left cerebellum. Mass effect with narrowed fourth ventricle, but no hydrocephalus at this time. Basal cisterns are effaced and there is early ascending transtentorial herniation. CTA head & neck moderate right P2 stenosis 1/6 - CT  head repeat - Interval left occipital craniectomy for evacuation of the large hematoma. Subtotal evacuation with only a small amount of residual hematoma being visible. Much less mass effect. No evidence of developing hydrocephalus.  1/7 MRI w/wo- Postoperative day 1 left suboccipital craniectomy and evacuation of cerebellar hemorrhage. Residual approximately 15 mL hematoma with small volume bilateral subarachnoid blood. No evidence of underlying tumor or lesion. Cerebellar edema and mass effect have regressed since presentation, with  improved basilar cistern and 4th ventricle patency.  1/8 CT Head- unchanged postoperative appearance of the cerebellum and small volume residual hemorrhage with patency of basilar cistern and fourth ventricle Needs outpt follow up with NSG for cerebral angiogram once ICH resolves to rule out cerebellar AVM 2D Echo - 65 -70% grade 1 diastolic dysfunction LDL 81 HgbA1c 6.1 UDS negative VTE prophylaxis -SCDs No antithrombotic prior to admission, now on No antithrombotic in the setting of IPH Therapy recommendations:  CIR Disposition: pending   Cerebellar edema S/p craniectomy for hematoma evacuation Na 144- 142 -143-144-142-> 141-137 Neuro checks, monitor for hydrocephalus Bolus 23.4% 1/7 Hypertonic saline discontinued 1/9 Needs outpt follow up with NSG for cerebral angiogram once ICH resolves to rule out cerebellar AVM  Acute respiratory insufficiency Intubated peri prodecure   Extubated 1/7 Tolerating well so far CCM on board  BP management  No hx of HTN Home meds: None BP elevated on arrival with systolic in the 200s On amlodipine  10 -> now off due to hypotension BP Goal less than 160 Off Cleviprex  Long term BP goal normotensive  Hyperlipidemia  Not on home meds yet Had recent dietitian appointment LDL 81, goal < 70 On lipitor 20 No high intensity statin given LDL not far from goal Continue on discharge  Dysphagia Now on clear liquid and thin liquid Aspiration precaution Speech on board  Other Stroke Risk Factors Obesity, Body mass index is 38.08 kg/m., BMI >/= 30 associated with increased stroke risk, recommend weight loss, diet and exercise as appropriate OSA   Other Active Problems Hypokalemia (stable) 3.2 -> 4.4 -> 4.2-> 3.7-> 3.5-> stable Elevated liver enzymes - improving  AST/ALT - 51/76 -> 38/54 Leukocytosis WBC 4.8 -> 11.7-> 10.8-> 9.4  Hospital day # 3  Patient seen and examined by NP/APP with MD. MD to update note as needed.   Cortney E Everitt Clint Kill , MSN, AGACNP-BC [facility] See Amion for schedule and pager information 12/19/2023 2:14 PM  ATTENDING NOTE: I reviewed above note and agree with the assessment and plan. Pt was seen and examined.   Wife at the bedside. Pt lying in bed, neuro stable and b/l UE dysmetria has resolved now. Moving all extremities. AAO x 3. No aphasia. Doing well. Na stable and 3% saline has discontinued. Transfer out of ICU.  Etiology for cerebellar ICH still not quite clear, he has no Hx of HTN, put on norvasc  10 caused him hypotension. Now off BP meds, and BP stable. Will need outpt follow up with NSG once ICH resolves to consider cerebral angiogram to rule out cerebellar AVM. On low dose statin, PT and OT recommend CIR.   For detailed assessment and plan, please refer to above/below as I have made changes wherever appropriate.   Neurology will sign off. Please call with questions. Pt will follow up with stroke clinic Dr. Rosemarie at [facility] in about 4 weeks. Thanks for the consult.   Ary Cummins, MD PhD Stroke Neurology 12/19/2023 6:02 PM    To contact Stroke Continuity provider, please  refer to [email or web address]. After hours, contact General Neurology

## 2023-12-19 NOTE — TOC Initial Note (Signed)
 Transition of Care [facility]) - Initial/Assessment Note    Patient Details  Name: Wayne Lee MRN: [id number] Date of Birth: 10/28/1976  Transition of [facility]) CM/SW Contact:    Inocente GORMAN Kindle, LCSW Phone Number: 12/19/2023, 10:14 AM  Clinical Narrative:                 Patient admitted from home with spouse. TOC following for CIR determination of candidacy.   Expected Discharge Plan: IP Rehab Facility Barriers to Discharge: English As A Second Language Teacher, Continued Medical Work up   Patient Goals and CMS Choice Patient states their goals for this hospitalization and ongoing recovery are:: Rehab          Expected Discharge Plan and Services In-house Referral: Clinical Social Work   Post Acute Care Choice: IP Rehab Living arrangements for the past 2 months: Single Family Home                                      Prior Living Arrangements/Services Living arrangements for the past 2 months: Single Family Home Lives with:: Spouse Patient language and need for interpreter reviewed:: Yes Do you feel safe going back to the place where you live?: Yes      Need for Family Participation in Patient Care: Yes (Comment) Care giver support system in place?: Yes (comment)   Criminal Activity/Legal Involvement Pertinent to Current Situation/Hospitalization: No - Comment as needed  Activities of Daily Living      Permission Sought/Granted Permission sought to share information with : Facility Medical Sales Representative, Family Supports Permission granted to share information with : Yes, Verbal Permission Granted  Share Information with NAME: Adda     Permission granted to share info w Relationship: Spouse  Permission granted to share info w Contact Information: [phone]  Emotional Assessment Appearance:: Appears stated age Attitude/Demeanor/Rapport: Engaged Affect (typically observed): Appropriate Orientation: : Oriented to Self, Oriented to Place, Oriented to  Time,  Oriented to Situation Alcohol  / Substance Use: Not Applicable Psych Involvement: No (comment)  Admission diagnosis:  Nontraumatic acute cerebral hemorrhage (HCC) [I61.9] Cerebellar hemorrhage (HCC) [I61.4] Patient Active Problem List   Diagnosis Date Noted   Nontraumatic acute cerebral hemorrhage (HCC) 12/16/2023   Cerebellar hemorrhage (HCC) 12/16/2023   PCP:  [name], MD Pharmacy:   [facility] [phone] - [place], [place] - [address] [place] [address] Phone: [phone] Fax: [phone]     Social Drivers of Health (SDOH) Social History: SDOH Screenings   Food Insecurity: No Food Insecurity (12/17/2023)  Housing: Low Risk  (12/17/2023)  Transportation Needs: No Transportation Needs (12/17/2023)  Utilities: Not At Risk (12/17/2023)  Financial Resource Strain: Low Risk  (09/02/2023)   Received from [facility]  Physical Activity: Unknown (09/02/2023)   Received from [facility]  Social Connections: Socially Integrated (09/02/2023)   Received from [facility]  Stress: No Stress Concern Present (09/02/2023)   Received from [facility]  Tobacco Use: Medium Risk (11/28/2023)   Received from [facility]   SDOH Interventions:     Readmission Risk Interventions     No data to display

## 2023-12-19 NOTE — Progress Notes (Signed)
 Patient ID: Wayne Lee, male   DOB: Jul 26, 1976, 48 y.o.   MRN: [id number] Vital signs are stable and patient is awake and alert and moving all 4 extremities.  He has been out of bed.  He is ready to transfer out of the unit.  I believe he would be a good candidate for comprehensive inpatient rehabilitation.  His incision is clean and dry dressing has been removed.  We can leave it open to air.  I have discussed the situation with critical care.  I like to have the patient managed by hospitalist service.  I will remain a research scientist (medical).

## 2023-12-19 NOTE — Plan of Care (Signed)
 D/w Dr. Danielle Dess -- txf to progressive, stop keppra, start lovenox. Will as TRH to take over tomorrow    Tessie Fass MSN, AGACNP-BC [facility] Pulmonary/Critical Care Medicine Amion for pager  12/19/2023, 2:18 PM

## 2023-12-20 ENCOUNTER — Other Ambulatory Visit: Payer: Self-pay

## 2023-12-20 ENCOUNTER — Encounter (HOSPITAL_COMMUNITY): Payer: Self-pay | Admitting: Physical Medicine and Rehabilitation

## 2023-12-20 ENCOUNTER — Encounter (HOSPITAL_COMMUNITY): Payer: Self-pay | Admitting: Neurological Surgery

## 2023-12-20 ENCOUNTER — Inpatient Hospital Stay (HOSPITAL_COMMUNITY)
Admission: AD | Admit: 2023-12-20 | Discharge: 2023-12-31 | DRG: 057 | Disposition: A | Discharge status: Home / Self Care | Payer: 59 | Source: Intra-hospital | Attending: Physical Medicine and Rehabilitation | Admitting: Physical Medicine and Rehabilitation

## 2023-12-20 DIAGNOSIS — M25512 Pain in left shoulder: Secondary | ICD-10-CM | POA: Diagnosis not present

## 2023-12-20 DIAGNOSIS — I69134 Monoplegia of upper limb following nontraumatic intracerebral hemorrhage affecting left non-dominant side: Secondary | ICD-10-CM | POA: Diagnosis not present

## 2023-12-20 DIAGNOSIS — M542 Cervicalgia: Secondary | ICD-10-CM | POA: Diagnosis not present

## 2023-12-20 DIAGNOSIS — G441 Vascular headache, not elsewhere classified: Secondary | ICD-10-CM | POA: Diagnosis not present

## 2023-12-20 DIAGNOSIS — H5500 Unspecified nystagmus: Secondary | ICD-10-CM | POA: Diagnosis present

## 2023-12-20 DIAGNOSIS — Z79899 Other long term (current) drug therapy: Secondary | ICD-10-CM

## 2023-12-20 DIAGNOSIS — N179 Acute kidney failure, unspecified: Secondary | ICD-10-CM | POA: Diagnosis not present

## 2023-12-20 DIAGNOSIS — L209 Atopic dermatitis, unspecified: Secondary | ICD-10-CM | POA: Diagnosis present

## 2023-12-20 DIAGNOSIS — I69193 Ataxia following nontraumatic intracerebral hemorrhage: Secondary | ICD-10-CM | POA: Diagnosis present

## 2023-12-20 DIAGNOSIS — E871 Hypo-osmolality and hyponatremia: Secondary | ICD-10-CM | POA: Diagnosis present

## 2023-12-20 DIAGNOSIS — I6622 Occlusion and stenosis of left posterior cerebral artery: Secondary | ICD-10-CM | POA: Diagnosis present

## 2023-12-20 DIAGNOSIS — I69198 Other sequelae of nontraumatic intracerebral hemorrhage: Secondary | ICD-10-CM

## 2023-12-20 DIAGNOSIS — D62 Acute posthemorrhagic anemia: Secondary | ICD-10-CM | POA: Diagnosis present

## 2023-12-20 DIAGNOSIS — Z9049 Acquired absence of other specified parts of digestive tract: Secondary | ICD-10-CM

## 2023-12-20 DIAGNOSIS — R066 Hiccough: Secondary | ICD-10-CM | POA: Diagnosis present

## 2023-12-20 DIAGNOSIS — I6911 Attention and concentration deficit following nontraumatic intracerebral hemorrhage: Secondary | ICD-10-CM | POA: Diagnosis not present

## 2023-12-20 DIAGNOSIS — R442 Other hallucinations: Secondary | ICD-10-CM | POA: Diagnosis present

## 2023-12-20 DIAGNOSIS — Z961 Presence of intraocular lens: Secondary | ICD-10-CM | POA: Diagnosis present

## 2023-12-20 DIAGNOSIS — M25511 Pain in right shoulder: Secondary | ICD-10-CM | POA: Diagnosis not present

## 2023-12-20 DIAGNOSIS — K5900 Constipation, unspecified: Secondary | ICD-10-CM | POA: Diagnosis present

## 2023-12-20 DIAGNOSIS — E669 Obesity, unspecified: Secondary | ICD-10-CM | POA: Diagnosis present

## 2023-12-20 DIAGNOSIS — I10 Essential (primary) hypertension: Secondary | ICD-10-CM | POA: Diagnosis present

## 2023-12-20 DIAGNOSIS — G4700 Insomnia, unspecified: Secondary | ICD-10-CM | POA: Diagnosis present

## 2023-12-20 DIAGNOSIS — H532 Diplopia: Secondary | ICD-10-CM | POA: Diagnosis present

## 2023-12-20 DIAGNOSIS — I69111 Memory deficit following nontraumatic intracerebral hemorrhage: Secondary | ICD-10-CM

## 2023-12-20 DIAGNOSIS — I619 Nontraumatic intracerebral hemorrhage, unspecified: Principal | ICD-10-CM | POA: Diagnosis present

## 2023-12-20 DIAGNOSIS — I69118 Other symptoms and signs involving cognitive functions following nontraumatic intracerebral hemorrhage: Secondary | ICD-10-CM

## 2023-12-20 DIAGNOSIS — Z809 Family history of malignant neoplasm, unspecified: Secondary | ICD-10-CM

## 2023-12-20 DIAGNOSIS — G4710 Hypersomnia, unspecified: Secondary | ICD-10-CM | POA: Diagnosis present

## 2023-12-20 DIAGNOSIS — R112 Nausea with vomiting, unspecified: Secondary | ICD-10-CM | POA: Diagnosis not present

## 2023-12-20 DIAGNOSIS — R7989 Other specified abnormal findings of blood chemistry: Secondary | ICD-10-CM | POA: Diagnosis not present

## 2023-12-20 DIAGNOSIS — Z880 Allergy status to penicillin: Secondary | ICD-10-CM

## 2023-12-20 DIAGNOSIS — Z91018 Allergy to other foods: Secondary | ICD-10-CM

## 2023-12-20 DIAGNOSIS — R519 Headache, unspecified: Secondary | ICD-10-CM | POA: Insufficient documentation

## 2023-12-20 DIAGNOSIS — Z6837 Body mass index (BMI) 37.0-37.9, adult: Secondary | ICD-10-CM

## 2023-12-20 DIAGNOSIS — G4489 Other headache syndrome: Secondary | ICD-10-CM | POA: Diagnosis present

## 2023-12-20 DIAGNOSIS — I614 Nontraumatic intracerebral hemorrhage in cerebellum: Secondary | ICD-10-CM | POA: Diagnosis not present

## 2023-12-20 DIAGNOSIS — Z9889 Other specified postprocedural states: Secondary | ICD-10-CM

## 2023-12-20 DIAGNOSIS — Z9842 Cataract extraction status, left eye: Secondary | ICD-10-CM

## 2023-12-20 DIAGNOSIS — Z8249 Family history of ischemic heart disease and other diseases of the circulatory system: Secondary | ICD-10-CM

## 2023-12-20 LAB — BASIC METABOLIC PANEL
Anion gap: 10 (ref 5–15)
Anion gap: 9 (ref 5–15)
Anion gap: 10 (ref 5–15)
BUN: 21 mg/dL — ABNORMAL HIGH (ref 6–20)
BUN: 13 mg/dL (ref 6–20)
BUN: 16 mg/dL (ref 6–20)
CO2: 23 mmol/L (ref 22–32)
CO2: 25 mmol/L (ref 22–32)
CO2: 24 mmol/L (ref 22–32)
Calcium: 9.4 mg/dL (ref 8.9–10.3)
Calcium: 9.7 mg/dL (ref 8.9–10.3)
Calcium: 9.4 mg/dL (ref 8.9–10.3)
Chloride: 103 mmol/L (ref 98–111)
Chloride: 101 mmol/L (ref 98–111)
Chloride: 100 mmol/L (ref 98–111)
Creatinine, Ser: 1.07 mg/dL (ref 0.61–1.24)
Creatinine, Ser: 1.15 mg/dL (ref 0.61–1.24)
Creatinine, Ser: 1.15 mg/dL (ref 0.61–1.24)
GFR, Estimated: >60 mL/min (ref >60)
GFR, Estimated: >60 mL/min (ref >60)
GFR, Estimated: >60 mL/min (ref >60)
Glucose, Bld: 122 mg/dL — ABNORMAL HIGH (ref 70–99)
Glucose, Bld: 115 mg/dL — ABNORMAL HIGH (ref 70–99)
Glucose, Bld: 119 mg/dL — ABNORMAL HIGH (ref 70–99)
Potassium: 3.9 mmol/L (ref 3.5–5.1)
Potassium: 3.8 mmol/L (ref 3.5–5.1)
Potassium: 4.1 mmol/L (ref 3.5–5.1)
Sodium: 136 mmol/L (ref 135–145)
Sodium: 135 mmol/L (ref 135–145)
Sodium: 134 mmol/L — ABNORMAL LOW (ref 135–145)

## 2023-12-20 LAB — GLUCOSE, CAPILLARY
Glucose-Capillary: 114 mg/dL — ABNORMAL HIGH (ref 70–99)
Glucose-Capillary: 103 mg/dL — ABNORMAL HIGH (ref 70–99)
Glucose-Capillary: 104 mg/dL — ABNORMAL HIGH (ref 70–99)
Glucose-Capillary: 122 mg/dL — ABNORMAL HIGH (ref 70–99)

## 2023-12-20 MED ORDER — ATORVASTATIN CALCIUM 10 MG PO TABS
20.0000 mg | ORAL_TABLET | Freq: Every day | ORAL | Status: DC
  Administered 2023-12-21 – 2023-12-31 (×11): 20 mg via ORAL
  Filled 2023-12-20 (×11): qty 2

## 2023-12-20 MED ORDER — GABAPENTIN 100 MG PO CAPS
100.0000 mg | ORAL_CAPSULE | Freq: Two times a day (BID) | ORAL | Status: DC
  Administered 2023-12-21 – 2023-12-30 (×19): 100 mg via ORAL
  Filled 2023-12-20 (×19): qty 1

## 2023-12-20 MED ORDER — PROCHLORPERAZINE 25 MG RE SUPP: 12.5000 mg | Freq: Four times a day (QID) | RECTAL | Status: DC | PRN

## 2023-12-20 MED ORDER — ALUM & MAG HYDROXIDE-SIMETH 200-200-20 MG/5ML PO SUSP: 30.0000 mL | ORAL | Status: DC | PRN

## 2023-12-20 MED ORDER — SENNOSIDES-DOCUSATE SODIUM 8.6-50 MG PO TABS
2.0000 | ORAL_TABLET | Freq: Every day | ORAL | Status: DC
  Administered 2023-12-21 – 2023-12-29 (×8): 2 via ORAL
  Filled 2023-12-20 (×12): qty 2

## 2023-12-20 MED ORDER — GABAPENTIN 300 MG PO CAPS
300.0000 mg | ORAL_CAPSULE | Freq: Every day | ORAL | Status: DC
  Administered 2023-12-20 – 2023-12-29 (×9): 300 mg via ORAL
  Filled 2023-12-20 (×9): qty 1

## 2023-12-20 MED ORDER — SORBITOL 70 % SOLN: 30.0000 mL | Freq: Every day | Status: DC | PRN

## 2023-12-20 MED ORDER — HEPARIN SODIUM (PORCINE) 5000 UNIT/ML IJ SOLN
5000.0000 [IU] | Freq: Three times a day (TID) | INTRAMUSCULAR | Status: DC
  Administered 2023-12-20: 5000 [IU] via SUBCUTANEOUS
  Filled 2023-12-20: qty 1

## 2023-12-20 MED ORDER — POLYETHYLENE GLYCOL 3350 17 G PO PACK
34.0000 g | PACK | Freq: Once | ORAL | Status: AC
  Administered 2023-12-20: 34 g via ORAL
  Filled 2023-12-20: qty 2

## 2023-12-20 MED ORDER — TRIAMCINOLONE ACETONIDE 0.025 % EX OINT
1.0000 | TOPICAL_OINTMENT | Freq: Two times a day (BID) | CUTANEOUS | Status: DC | PRN
Start: 2023-12-20 — End: 2023-12-31

## 2023-12-20 MED ORDER — FLEET ENEMA RE ENEM: 1.0000 | ENEMA | Freq: Once | RECTAL | Status: DC | PRN

## 2023-12-20 MED ORDER — BISACODYL 10 MG RE SUPP: 10.0000 mg | Freq: Every day | RECTAL | Status: DC | PRN

## 2023-12-20 MED ORDER — HEPARIN SODIUM (PORCINE) 5000 UNIT/ML IJ SOLN: 5000.0000 [IU] | Freq: Three times a day (TID) | INTRAMUSCULAR | Status: DC

## 2023-12-20 MED ORDER — MECLIZINE HCL 25 MG PO TABS
25.0000 mg | ORAL_TABLET | Freq: Three times a day (TID) | ORAL | Status: DC | PRN
  Administered 2023-12-24: 25 mg via ORAL
  Filled 2023-12-20: qty 1

## 2023-12-20 MED ORDER — PROCHLORPERAZINE MALEATE 5 MG PO TABS: 5.0000 mg | ORAL_TABLET | Freq: Four times a day (QID) | ORAL | Status: DC | PRN

## 2023-12-20 MED ORDER — PROCHLORPERAZINE EDISYLATE 10 MG/2ML IJ SOLN: 5.0000 mg | Freq: Four times a day (QID) | INTRAMUSCULAR | Status: DC | PRN

## 2023-12-20 MED ORDER — POLYVINYL ALCOHOL 1.4 % OP SOLN: 2.0000 [drp] | OPHTHALMIC | Status: DC | PRN

## 2023-12-20 MED ORDER — ATORVASTATIN CALCIUM 20 MG PO TABS: 20.0000 mg | ORAL_TABLET | Freq: Every day | ORAL | 0 refills | Status: DC

## 2023-12-20 MED ORDER — PROMETHAZINE HCL 12.5 MG PO TABS: 12.5000 mg | ORAL_TABLET | ORAL | 0 refills | Status: DC | PRN

## 2023-12-20 MED ORDER — DIPHENHYDRAMINE HCL 25 MG PO CAPS
25.0000 mg | ORAL_CAPSULE | Freq: Four times a day (QID) | ORAL | Status: DC | PRN
  Administered 2023-12-24: 25 mg via ORAL
  Filled 2023-12-20: qty 1

## 2023-12-20 MED ORDER — BUTALBITAL-APAP-CAFFEINE 50-325-40 MG PO TABS: 1.0000 | ORAL_TABLET | Freq: Four times a day (QID) | ORAL | 0 refills | Status: DC | PRN

## 2023-12-20 MED ORDER — ACETAMINOPHEN 325 MG PO TABS
325.0000 mg | ORAL_TABLET | ORAL | Status: DC | PRN
  Administered 2023-12-21: 650 mg via ORAL
  Filled 2023-12-20 (×2): qty 2

## 2023-12-20 MED ORDER — TRAZODONE HCL 50 MG PO TABS
25.0000 mg | ORAL_TABLET | Freq: Every evening | ORAL | Status: DC | PRN
  Administered 2023-12-21 – 2023-12-22 (×2): 50 mg via ORAL
  Filled 2023-12-20 (×3): qty 1

## 2023-12-20 MED ORDER — GUAIFENESIN-DM 100-10 MG/5ML PO SYRP: 5.0000 mL | ORAL_SOLUTION | Freq: Four times a day (QID) | ORAL | Status: DC | PRN

## 2023-12-20 MED ORDER — TOPIRAMATE 25 MG PO TABS
25.0000 mg | ORAL_TABLET | Freq: Two times a day (BID) | ORAL | Status: DC
  Administered 2023-12-20 – 2023-12-27 (×14): 25 mg via ORAL
  Filled 2023-12-20 (×14): qty 1

## 2023-12-20 MED ORDER — HEPARIN SODIUM (PORCINE) 5000 UNIT/ML IJ SOLN
5000.0000 [IU] | Freq: Three times a day (TID) | INTRAMUSCULAR | Status: DC
Start: 2023-12-20 — End: 2023-12-30
  Administered 2023-12-20 – 2023-12-30 (×30): 5000 [IU] via SUBCUTANEOUS
  Filled 2023-12-20 (×31): qty 1

## 2023-12-20 MED ORDER — TRAMADOL HCL 50 MG PO TABS
50.0000 mg | ORAL_TABLET | Freq: Four times a day (QID) | ORAL | Status: DC | PRN
  Administered 2023-12-20 – 2023-12-23 (×9): 50 mg via ORAL
  Filled 2023-12-20 (×9): qty 1

## 2023-12-20 NOTE — Progress Notes (Signed)
 Babs Arthea DASEN, MD  Physician Physical Medicine and Rehabilitation   Consult Note     Signed   Date of Service: 12/19/2023  8:42 AM  Related encounter: ED to Hosp-Admission (Current) from 12/16/2023 in [facility] NEURO/TRAUMA/SURGICAL ICU   Signed     Expand All Collapse All  Show:Clear all [x] Written[x] Templated[] Copied  Added by: [x] Babs Arthea DASEN, MD  [] Hover for details          Physical Medicine and Rehabilitation Consult Reason for Consult: Impaired balance and functional mobility Referring Physician: Colon     HPI: Wayne Lee is a 48 y.o. male with a history of obesity and sleep apnea who presented on 12/15/2022 with severe headache and dizziness.  Blood pressure upon presentation was 204/106.  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  Basal cisterns were effaced with early ascending transtentorial herniation.  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  The patient was evaluated by neurosurgery and underwent a suboccipital craniectomy with decompression of the cerebellar hemorrhage by Dr. [name].  Patient was extubated the next day and placed on hypertonic saline to manage cerebellar edema.  He was started on a full liquid diet after bedside swallow screen.  He reported postoperative headaches and pain at the surgical site.  Patient with a temperature yesterday of 101.3 but demonstrated improving arousal and cognition.  Patient was up with therapies yesterday and was min assist for sit to stand transfers and bed mobility.  He is able to walk 12 feet at a mod assist level using a rolling walker and +2 assist for safety.  Gait reported is very unsteady with staggering movements to the left at times and wide base of support.  Patient fatigued fairly easily on evaluation.  Patient lives at home with his  spouse in a two-level home with 4 steps to enter.  It appears that his bathroom is on the second floor.  Patient was independent and working prior to this presentation.     Review of Systems  Constitutional: Negative.   HENT: Negative.    Eyes:  Positive for double vision.  Respiratory: Negative.    Cardiovascular: Negative.   Gastrointestinal:  Positive for nausea.  Genitourinary: Negative.   Musculoskeletal: Negative.   Neurological:  Positive for dizziness, tremors, focal weakness and headaches.  Psychiatric/Behavioral: Negative.      History reviewed. No pertinent past medical history.          Past Surgical History:  Procedure Laterality Date   SUBOCCIPITAL CRANIECTOMY CERVICAL LAMINECTOMY N/A 12/16/2023    Procedure: SUBOCCIPITAL CRANIECTOMY;  Surgeon: Colon Victory, MD;  Location: [facility] OR;  Service: Neurosurgery;  Laterality: N/A;        History reviewed. No pertinent family history.     Social History:  has no history on file for tobacco use, alcohol  use, and drug use. Allergies:  Allergies       Allergies  Allergen Reactions   Apple Other (See Comments) and Nausea And Vomiting      RED ONLY   Corn-Containing Products Other (See Comments) and Nausea And Vomiting   Penicillins Hives and Swelling            Medications Prior to Admission  Medication Sig Dispense Refill   acetaminophen  (TYLENOL ) 500 MG tablet Take 500-1,000 mg by mouth every 6 (six) hours  as needed for moderate pain (pain score 4-6).       DM-Doxylamine-Acetaminophen  (NYQUIL COLD & FLU PO) Take 1 Dose by mouth every 6 (six) hours as needed (Cold/cough sx).       ibuprofen  (ADVIL ) 200 MG tablet Take 400 mg by mouth every 6 (six) hours as needed for moderate pain (pain score 4-6).       triamcinolone  (KENALOG ) 0.025 % ointment Apply 1 Application topically 2 (two) times daily as needed (Dermatitis).              Home: Home Living Family/patient expects to be discharged to:: Private  residence Living Arrangements: Spouse/significant other Available Help at Discharge: Family, Available 24 hours/day Type of Home: House Home Access: Stairs to enter Secretary/administrator of Steps: 4 Entrance Stairs-Rails: Can reach both Home Layout: Two level Alternate Level Stairs-Number of Steps: full flight Bathroom Shower/Tub: Tub/shower unit (likes tub) Bathroom Toilet: Standard Home Equipment: None Additional Comments: works emergency planning/management officer, practicing therapist, 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  Lives With: Spouse  Functional History: Prior Function Prior Level of Function : Independent/Modified Independent, Working/employed, Teacher, Adult Education therapy/therapist) Functional Status:  Mobility: Bed Mobility Overal bed mobility: Needs Assistance Bed Mobility: Supine to Sit, Sit to Supine Supine to sit: Min assist, +2 for physical assistance General bed mobility comments: initial struggle bring weight from supine to L side needing light min assist.  Once up balance was variable and unsteady with pt staying upright with effort. Transfers Overall transfer level: Needs assistance Equipment used: Sliding board Transfers: Sit to/from Stand Sit to Stand: Min assist, Mod assist General transfer comment: cues for hand placement, min stability assist with some forward assist, but no need for boost. Ambulation/Gait Ambulation/Gait assistance: Mod assist, +2 safety/equipment Gait Distance (Feet): 12 Feet (to bathroom and 70 into the hallway and back) Assistive device: Rolling walker (2 wheels), 1 person hand held assist Gait Pattern/deviations: Step-through pattern, Decreased stride length, Scissoring, Ataxic, Staggering left General Gait Details: .gait unsteady overall with wide BOS, staggering L at times, 1 episode of uncontrolled falling L when opening eyes from period of closed.  Worsening with fatigue and need for 2 people to be more hands on Gait velocity  interpretation: <1.8 ft/sec, indicate of risk for recurrent falls   ADL: ADL Overall ADL's : Needs assistance/impaired Eating/Feeding: Minimal assistance Grooming: Minimal assistance Lower Body Dressing: Maximal assistance Lower Body Dressing Details (indicate cue type and reason): pt (A) with hand over hand to get sock in hands. pt with total (A) to place R LE in figure 4 cross. pt reaching with L UE to pull sock over heel that was backward chained for the patient Toilet Transfer: +2 for physical assistance, Moderate assistance, Cueing for safety, Regular Toilet, Grab bars (standing) Toileting- Clothing Manipulation and Hygiene: Maximal assistance Functional mobility during ADLs: +2 for physical assistance, +2 for safety/equipment, Moderate assistance, Cueing for sequencing, Cueing for safety, Rolling walker (2 wheels) General ADL Comments: wife present entire session   Cognition: Cognition Overall Cognitive Status: Impaired/Different from baseline Arousal/Alertness: Awake/alert Orientation Level: Oriented X4 Attention: Sustained Sustained Attention: Impaired Sustained Attention Impairment: Verbal basic, Functional basic Memory: Impaired Memory Impairment: Retrieval deficit Awareness: Impaired Awareness Impairment: Emergent impairment Problem Solving: Impaired Problem Solving Impairment: Verbal basic Cognition Arousal: Alert, Lethargic Behavior During Therapy: WFL for tasks assessed/performed, Flat affect Overall Cognitive Status: Impaired/Different from baseline General Comments: increased time to respond , clue for sustain attention   Blood pressure (!) 141/93, pulse  63, temperature 98.6 F (37 C), temperature source Axillary, resp. rate 16, height 5' 8 (1.727 m), weight 113.6 kg, SpO2 96%. Physical Exam Constitutional:      Appearance: He is obese.  HENT:     Head:     Comments: Suboccipital crani site dry with old blood, intact. Honeycomb dressing in place.    Nose:  Nose normal.  Eyes:     Extraocular Movements: Extraocular movements intact.     Conjunctiva/sclera: Conjunctivae normal.  Cardiovascular:     Rate and Rhythm: Normal rate.  Pulmonary:     Effort: Pulmonary effort is normal.  Abdominal:     Palpations: Abdomen is soft.  Musculoskeletal:        General: No swelling or tenderness.  Skin:    General: Skin is warm and dry.  Neurological:     Comments: Pt fairly alert. Tended to keep eyes open. Was eating jello while lying on right side. Oriented to person, place, time, reason he was here. No focal CN findings. No diplopia with visual tracking. Moved all 4 limbs at least 3/5. No focal sensory findings or abnormal tone appreciate. Difficult to test for limb ataxia today  Psychiatric:     Comments: Flat but overall pleasant and appropriate        Lab Results Last 24 Hours       Results for orders placed or performed during the hospital encounter of 12/16/23 (from the past 24 hours)  Glucose, capillary     Status: None    Collection Time: 12/18/23 12:01 PM  Result Value Ref Range    Glucose-Capillary 97 70 - 99 mg/dL  Sodium     Status: None    Collection Time: 12/18/23  1:15 PM  Result Value Ref Range    Sodium 142 135 - 145 mmol/L  Glucose, capillary     Status: None    Collection Time: 12/18/23  3:33 PM  Result Value Ref Range    Glucose-Capillary 95 70 - 99 mg/dL  Glucose, capillary     Status: Abnormal    Collection Time: 12/18/23  7:31 PM  Result Value Ref Range    Glucose-Capillary 142 (H) 70 - 99 mg/dL  Glucose, capillary     Status: None    Collection Time: 12/18/23 11:54 PM  Result Value Ref Range    Glucose-Capillary 88 70 - 99 mg/dL  CBC     Status: Abnormal    Collection Time: 12/19/23  2:45 AM  Result Value Ref Range    WBC 9.4 4.0 - 10.5 K/uL    RBC 4.41 4.22 - 5.81 MIL/uL    Hemoglobin 12.0 (L) 13.0 - 17.0 g/dL    HCT 62.3 (L) 60.9 - 52.0 %    MCV 85.3 80.0 - 100.0 fL    MCH 27.2 26.0 - 34.0 pg    MCHC  31.9 30.0 - 36.0 g/dL    RDW 86.9 88.4 - 84.4 %    Platelets 207 150 - 400 K/uL    nRBC 0.0 0.0 - 0.2 %  Basic metabolic panel     Status: Abnormal    Collection Time: 12/19/23  2:45 AM  Result Value Ref Range    Sodium 138 135 - 145 mmol/L    Potassium 3.5 3.5 - 5.1 mmol/L    Chloride 108 98 - 111 mmol/L    CO2 25 22 - 32 mmol/L    Glucose, Bld 163 (H) 70 - 99 mg/dL    BUN 7  6 - 20 mg/dL    Creatinine, Ser 8.98 0.61 - 1.24 mg/dL    Calcium  8.8 (L) 8.9 - 10.3 mg/dL    GFR, Estimated >39 >39 mL/min    Anion gap 5 5 - 15  Glucose, capillary     Status: Abnormal    Collection Time: 12/19/23  3:46 AM  Result Value Ref Range    Glucose-Capillary 148 (H) 70 - 99 mg/dL  Glucose, capillary     Status: Abnormal    Collection Time: 12/19/23  7:21 AM  Result Value Ref Range    Glucose-Capillary 109 (H) 70 - 99 mg/dL       Imaging Results (Last 48 hours)  DG Chest Port 1 View Result Date: 12/18/2023 CLINICAL DATA:  Fever. EXAM: PORTABLE CHEST 1 VIEW COMPARISON:  December 16, 2023. FINDINGS: Endotracheal and nasogastric tubes have been removed. Right internal jugular catheter is unchanged. Lungs are clear. Bony thorax is unremarkable. IMPRESSION: No active disease. Electronically Signed   By: Lynwood Landy Raddle M.D.   On: 12/18/2023 13:50    CT HEAD WO CONTRAST ( ) Result Date: 12/18/2023 CLINICAL DATA:  48 year old male code stroke presentation on 12/16/2023 with large cerebellar hemorrhage. Status post craniectomy and hematoma evacuation. EXAM: CT HEAD WITHOUT CONTRAST TECHNIQUE: Contiguous axial images were obtained from the base of the skull through the vertex without intravenous contrast. RADIATION DOSE REDUCTION: This exam was performed according to the departmental dose-optimization program which includes automated exposure control, adjustment of the mA and/or kV according to patient size and/or use of iterative reconstruction technique. COMPARISON:  Postoperative head CT 12/16/2023.  Brain MRI  yesterday. FINDINGS: Brain: Postoperative changes to the posterior cerebellum near the midline with stable small volume of patchy residual hyperdense hemorrhage (series 2, image 24), postoperative extra-axial fluid since 12/16/2023. Small volume pneumocephalus has regressed. Satisfactory basilar cistern and 4th ventricle patency now. Small volume mostly cerebellar folia subarachnoid blood. No ventriculomegaly. No convincing IVH. No supratentorial edema or mass effect. Supratentorial gray-white differentiation appears stable and negative. Vascular: No suspicious intracranial vascular hyperdensity. Skull: Stable left midline suboccipital craniectomy. Sinuses/Orbits: Scattered mild to moderate paranasal sinus mucosal thickening. Tympanic cavities and mastoids remain clear. Other: Suboccipital craniectomy postoperative changes. Small volume of postoperative fluid/seroma overlying the surgical defect does not appear significantly changed. Elsewhere stable orbit and scalp soft tissues. IMPRESSION: 1. Regressed small volume pneumocephalus with otherwise unchanged postoperative appearance of the cerebellum since hematoma evacuation. Small volume residual hemorrhage. Satisfactory 4th ventricle and basilar cistern patency now. 2. No new intracranial abnormality. Electronically Signed   By: VEAR Hurst M.D.   On: 12/18/2023 06:25       Assessment/Plan: Diagnosis: 48 year old male with a large left cerebellar hemorrhage requiring emergent craniectomy Does the need for close, 24 hr/day medical supervision in concert with the patient's rehab needs make it unreasonable for this patient to be served in a less intensive setting? Yes Co-Morbidities requiring supervision/potential complications:  -Hypertension -Obstructive sleep apnea/morbid obesity -Dysphagia Due to bladder management, bowel management, safety, skin/wound care, disease management, medication administration, pain management, and patient education, does the  patient require 24 hr/day rehab nursing? Yes Does the patient require coordinated care of a physician, rehab nurse, therapy disciplines of PT, OT, potentially SLP to address physical and functional deficits in the context of the above medical diagnosis(es)? Yes Addressing deficits in the following areas: balance, endurance, locomotion, strength, transferring, bowel/bladder control, bathing, dressing, feeding, grooming, toileting, cognition, swallowing, and psychosocial support Can the patient actively participate in an  intensive therapy program of at least 3 hrs of therapy per day at least 5 days per week? Yes The potential for patient to make measurable gains while on inpatient rehab is excellent Anticipated functional outcomes upon discharge from inpatient rehab are modified independent and supervision  with PT, modified independent and supervision with OT, modified independent with SLP. Estimated rehab length of stay to reach the above functional goals is: 9-14 dayys Anticipated discharge destination: Home Overall Rehab/Functional Prognosis: excellent   POST ACUTE RECOMMENDATIONS: This patient's condition is appropriate for continued rehabilitative care in the following setting: CIR Patient has agreed to participate in recommended program. Yes Note that insurance prior authorization may be required for reimbursement for recommended care.   Comment: Rehab Admissions Coordinator to follow up.       I have personally performed a face to face diagnostic evaluation of this patient. Additionally, I have examined the patient's medical record including any pertinent labs and radiographic images. If the physician assistant has documented in this note, I have reviewed and edited or otherwise concur with the physician assistant's documentation.   Thanks,   Arthea ONEIDA Gunther, MD 12/19/2023          Routing History

## 2023-12-20 NOTE — Progress Notes (Signed)
 Physical Therapy Treatment Patient Details Name: Wayne Lee MRN: [id number] DOB: [birth year] Today's Date: 12/20/2023   History of Present Illness 48 yo male arrival 1/6 with HTN 204/106 taken to emergent OR hematoma evacuation s/p suboccipital craniotomy and hypertonic saline started. Extubated 1/7 PMH obesity, sleep apnea, HLD    PT Comments  Pt reporting significant headache, as well as L hip pain. Pt motivated to progress mobility. Pt ambulatory for short hallway distance, requiring significant assist to steady and correct heavy L lateral bias. Pt is a great AIR candidate, plans to d/c there shortly.     If plan is discharge home, recommend the following: A little help with walking and/or transfers;A little help with bathing/dressing/bathroom;Assistance with cooking/housework;Assist for transportation;Help with stairs or ramp for entrance   Can travel by private vehicle        Equipment Recommendations  None recommended by PT    Recommendations for Other Services       Precautions / Restrictions Precautions Precautions: Fall Precaution Comments: watch BP Restrictions Weight Bearing Restrictions Per Provider Order: No     Mobility  Bed Mobility Overal bed mobility: Needs Assistance Bed Mobility: Supine to Sit, Sit to Supine     Supine to sit: Min assist Sit to supine: Min assist   General bed mobility comments: bed exit towards R, assist for trunk elevation and LE lift back into bed    Transfers Overall transfer level: Needs assistance Equipment used: 1 person hand held assist Transfers: Sit to/from Stand Sit to Stand: Min assist           General transfer comment: assist for rise and steady, L and posterior bias    Ambulation/Gait Ambulation/Gait assistance: Mod assist Gait Distance (Feet): 150 Feet Assistive device: 1 person hand held assist Gait Pattern/deviations: Step-through pattern, Drifts right/left, Wide base of support Gait velocity: decr      General Gait Details: assist to steady, correct L lateral bias with periods of near-scissoring   Stairs             Wheelchair Mobility     Tilt Bed    Modified Rankin (Stroke Patients Only) Modified Rankin (Stroke Patients Only) Pre-Morbid Rankin Score: No symptoms Modified Rankin: Moderately severe disability     Balance Overall balance assessment: Needs assistance Sitting-balance support: Single extremity supported, No upper extremity supported, Feet supported Sitting balance-Leahy Scale: Poor Sitting balance - Comments: supervision to CGA for sitting on EOB while performing grooming tasks   Standing balance support: During functional activity, Bilateral upper extremity supported Standing balance-Leahy Scale: Poor Standing balance comment: dependent on external support for standing balance.                            Cognition Arousal: Alert Behavior During Therapy: WFL for tasks assessed/performed, Flat affect Overall Cognitive Status: Impaired/Different from baseline                                 General Comments: flat affect vs anticipated baseline, but pt does tell jokes during session        Exercises      General Comments General comments (skin integrity, edema, etc.): VSS on RA      Pertinent Vitals/Pain Pain Assessment Pain Assessment: 0-10 Pain Score: 8  Pain Location: headache Pain Descriptors / Indicators: Headache Pain Intervention(s): Limited activity within patient's tolerance, Monitored during session, Repositioned, Patient  requesting pain meds-RN notified    Home Living                          Prior Function            PT Goals (current goals can now be found in the care plan section) Acute Rehab PT Goals Patient Stated Goal: Independent PT Goal Formulation: With patient Time For Goal Achievement: 12/27/23 Potential to Achieve Goals: Good Progress towards PT goals: Progressing toward  goals    Frequency           PT Plan      Co-evaluation              AM-PAC PT 6 Clicks Mobility   Outcome Measure  Help needed turning from your back to your side while in a flat bed without using bedrails?: A Little Help needed moving from lying on your back to sitting on the side of a flat bed without using bedrails?: A Lot Help needed moving to and from a bed to a chair (including a wheelchair)?: A Lot Help needed standing up from a chair using your arms (e.g., wheelchair or bedside chair)?: A Lot Help needed to walk in hospital room?: A Lot Help needed climbing 3-5 steps with a railing? : Total 6 Click Score: 12    End of Session Equipment Utilized During Treatment: Gait belt Activity Tolerance: Patient tolerated treatment well;Patient limited by fatigue Patient left: in bed;with call bell/phone within reach;with bed alarm set;with family/visitor present Nurse Communication: Mobility status;Patient requests pain meds PT Visit Diagnosis: Unsteadiness on feet (R26.81);Other abnormalities of gait and mobility (R26.89);Other symptoms and signs involving the nervous system (R29.898)     Time: 8452-8391 PT Time Calculation (min) (ACUTE ONLY): 21 min  Charges:    $Gait Training: 8-22 mins PT General Charges $$ ACUTE PT VISIT: 1 Visit                     Wayne Lee, PT DPT Acute Rehabilitation Services Secure Chat Preferred  Office [phone]    Wayne Lee [place] 12/20/2023, 4:31 PM

## 2023-12-20 NOTE — Discharge Summary (Signed)
 Physician Discharge Summary  Wayne Lee FMW:[id number] DOB: Jul 29, 1976 DOA: 12/16/2023  PCP: [name], MD  Admit date: 12/16/2023 Discharge date: 12/20/2023  Admitted From: Home Disposition: Inpatient rehab  Recommendations for Outpatient Follow-up:  Follow up with PCP in 1-2 weeks Follow-up with neurosurgery  Discharge Condition: Stable CODE STATUS: Full Diet recommendation: Low-salt low-fat low-carb diet  Brief/Interim Summary: 47yoM with PMH of HLD, BMI 38, and possible OSA who presented after developing headache, dizziness, 2 episodes of emesis, and left sided weakness.  Patient admitted with notable acute left intraparenchymal hemorrhage and brain compression status post suboccipital craniotomy 12/16/2023 with neurosurgery.  Patient has improved quite drastically over the past 48 hours, continues to advance with physical therapy speech therapy and has been accepted to inpatient rehab for ongoing care.  Patient's hypertensive emergency resolved, hypertonic saline weaned off per discussion with PCCM and neurosurgery.  He is otherwise stable and agreeable for discharge to inpatient rehab.  Discharge Diagnoses:  Principal Problem:   Nontraumatic acute cerebral hemorrhage (HCC) Active Problems:   Cerebellar hemorrhage (HCC)   Nontraumatic intracranial hemorrhage (HCC)   Hypertension   Brain compression (HCC)  Nontraumatic acute left intraparenchymal hemorrhage with brain compression status post suboptimal craniotomy 12/16/2023 -Stable postoperatively continue statin  Hypertensive emergency -Resolved, amlodipine  discontinued to avoid hypotension  Fever, without infectious process -likely reactive -Resolved Hyperlipidemia -continue statin Elevated AST/ALT -likely reactive in the setting of above Obesity Body mass index is 38.08 kg/m.   Discharge Instructions  Discharge Instructions     Ambulatory referral to Neurology   Complete by: As directed    Follow up with  Dr. Rosemarie at [facility] in 4-6 weeks. Too complicated for RN to follow. Thanks.   Discharge patient   Complete by: As directed    Discharge disposition: [facility]-Rehab Facility   Discharge patient date: 12/20/2023      Allergies as of 12/20/2023       Reactions   Apple Other (See Comments), Nausea And Vomiting   RED ONLY   Corn-containing Products Other (See Comments), Nausea And Vomiting   Penicillins Hives, Swelling        Medication List     STOP taking these medications    ibuprofen  200 MG tablet Commonly known as: ADVIL    NYQUIL COLD & FLU PO       TAKE these medications    acetaminophen  500 MG tablet Commonly known as: TYLENOL  Take 500-1,000 mg by mouth every 6 (six) hours as needed for moderate pain (pain score 4-6).   atorvastatin  20 MG tablet Commonly known as: LIPITOR Take 1 tablet (20 mg total) by mouth daily. Start taking on: December 21, 2023   butalbital -acetaminophen -caffeine  50-325-40 MG tablet Commonly known as: FIORICET  Take 1 tablet by mouth every 6 (six) hours as needed for headache.   promethazine  12.5 MG tablet Commonly known as: PHENERGAN  Take 1-2 tablets (12.5-25 mg total) by mouth every 4 (four) hours as needed for refractory nausea / vomiting.   triamcinolone  0.025 % ointment Commonly known as: KENALOG  Apply 1 Application topically 2 (two) times daily as needed (Dermatitis).        Follow-up Information     Wayne Eather RAMAN, MD. Schedule an appointment as soon as possible for a visit in 1 month(s).   Specialties: Neurology, Radiology Why: stroke clinic Contact information: [address] [place] [address] [phone]                Allergies  Allergen Reactions   Apple  Other (See Comments) and Nausea And Vomiting    RED ONLY   Corn-Containing Products Other (See Comments) and Nausea And Vomiting   Penicillins Hives and Swelling    Consultations: PCCM, neurosurgery  Procedures/Studies: DG Chest Port 1  View Result Date: 12/18/2023 CLINICAL DATA:  Fever. EXAM: PORTABLE CHEST 1 VIEW COMPARISON:  December 16, 2023. FINDINGS: Endotracheal and nasogastric tubes have been removed. Right internal jugular catheter is unchanged. Lungs are clear. Bony thorax is unremarkable. IMPRESSION: No active disease. Electronically Signed   By: Lynwood Landy Raddle M.D.   On: 12/18/2023 13:50   CT HEAD WO CONTRAST ( ) Result Date: 12/18/2023 CLINICAL DATA:  48 year old male code stroke presentation on 12/16/2023 with large cerebellar hemorrhage. Status post craniectomy and hematoma evacuation. EXAM: CT HEAD WITHOUT CONTRAST TECHNIQUE: Contiguous axial images were obtained from the base of the skull through the vertex without intravenous contrast. RADIATION DOSE REDUCTION: This exam was performed according to the departmental dose-optimization program which includes automated exposure control, adjustment of the mA and/or kV according to patient size and/or use of iterative reconstruction technique. COMPARISON:  Postoperative head CT 12/16/2023.  Brain MRI yesterday. FINDINGS: Brain: Postoperative changes to the posterior cerebellum near the midline with stable small volume of patchy residual hyperdense hemorrhage (series 2, image 24), postoperative extra-axial fluid since 12/16/2023. Small volume pneumocephalus has regressed. Satisfactory basilar cistern and 4th ventricle patency now. Small volume mostly cerebellar folia subarachnoid blood. No ventriculomegaly. No convincing IVH. No supratentorial edema or mass effect. Supratentorial gray-white differentiation appears stable and negative. Vascular: No suspicious intracranial vascular hyperdensity. Skull: Stable left midline suboccipital craniectomy. Sinuses/Orbits: Scattered mild to moderate paranasal sinus mucosal thickening. Tympanic cavities and mastoids remain clear. Other: Suboccipital craniectomy postoperative changes. Small volume of postoperative fluid/seroma overlying the surgical  defect does not appear significantly changed. Elsewhere stable orbit and scalp soft tissues. IMPRESSION: 1. Regressed small volume pneumocephalus with otherwise unchanged postoperative appearance of the cerebellum since hematoma evacuation. Small volume residual hemorrhage. Satisfactory 4th ventricle and basilar cistern patency now. 2. No new intracranial abnormality. Electronically Signed   By: VEAR Hurst M.D.   On: 12/18/2023 06:25   MR BRAIN W WO CONTRAST Result Date: 12/17/2023 CLINICAL DATA:  48 year old male code stroke presentation yesterday with relatively large cerebellar hemorrhage. Postoperative day 1 status post craniotomy, evacuation. EXAM: MRI HEAD WITHOUT AND WITH CONTRAST TECHNIQUE: Multiplanar, multiecho pulse sequences of the brain and surrounding structures were obtained without and with intravenous contrast. CONTRAST:  10mL GADAVIST  GADOBUTROL  1 MMOL/ML IV SOLN COMPARISON:  CT head, CTA head and neck yesterday. FINDINGS: Brain: Left suboccipital craniotomy with central and left cerebellar hemisphere resection cavity. Residual blood products with susceptibility encompassing an area of 35 by 56 by roughly 15 mm (AP by transverse by CC), approximately 15 mL. Cerebellar mass effect and edema appear regressed since 0141 hours yesterday, but not resolved. Improved basilar cistern patency. Fourth ventricle is patent. Small volume subarachnoid hemorrhage bilaterally,, including supratentorial. Little to no intraventricular blood. Susceptibility artifact on DWI, no restricted diffusion suggestive of acute infarction. Following contrast there is no suspicious enhancement in or around the cerebellar hemorrhage. No supratentorial midline shift, mass effect, ventriculomegaly. Cervicomedullary junction and pituitary are within normal limits. Supratentorial scattered mild to moderate for age nonspecific white matter T2 and FLAIR hyperintensity. No cerebral cortical encephalomalacia identified. No supratentorial  hemosiderin identified. No abnormal enhancement identified. No abnormal dural thickening identified. Vascular: Major intracranial vascular flow voids are preserved. Following contrast major dural venous sinuses are enhancing and  appear to be patent. Skull and upper cervical spine: Suboccipital craniectomy and tracking postoperative suboccipital fluid (series 17, image 11). Normal background bone marrow signal. Negative visible cervical spine. Sinuses/Orbits: Postoperative changes to the left globe. Otherwise negative orbits. Intubated. Scattered fluid and mucosal thickening in the paranasal sinuses. Other: Mastoids are well aerated. Grossly normal visible internal auditory structures. Fluid in the pharynx related to intubation. IMPRESSION: 1. Postoperative day 1 left suboccipital craniectomy and evacuation of cerebellar hemorrhage. Residual approximately 15 mL hematoma with small volume bilateral subarachnoid blood. No evidence of underlying tumor or lesion. Cerebellar edema and mass effect have regressed since presentation, with improved basilar cistern and 4th ventricle patency. 2. Supratentorial mild to moderate for age nonspecific white matter signal changes. No supratentorial hemosiderin. 3. No new intracranial abnormality. Electronically Signed   By: VEAR Hurst M.D.   On: 12/17/2023 06:50   CT HEAD WO CONTRAST Result Date: 12/16/2023 CLINICAL DATA:  Follow-up craniotomy EXAM: CT HEAD WITHOUT CONTRAST TECHNIQUE: Contiguous axial images were obtained from the base of the skull through the vertex without intravenous contrast. RADIATION DOSE REDUCTION: This exam was performed according to the departmental dose-optimization program which includes automated exposure control, adjustment of the mA and/or kV according to patient size and/or use of iterative reconstruction technique. COMPARISON:  CT studies earlier same day FINDINGS: Brain: Interval left occipital craniectomy for evacuation of a previously seen large  intraparenchymal hematoma. Subtotal evacuation with only a small amount of residual hematoma being visible. Fluid and a few air bubbles along the operative approach. Much less mass effect. No evidence of developing hydrocephalus. No new or unexpected finding. Vascular: No acute vascular finding. Skull: Otherwise negative Sinuses/Orbits: Clear/normal Other: None IMPRESSION: Interval left occipital craniectomy for evacuation of a previously seen large intraparenchymal hematoma. Subtotal evacuation with only a small amount of residual hematoma being visible. Much less mass effect. No evidence of developing hydrocephalus. No new or unexpected finding. Electronically Signed   By: Oneil Officer M.D.   On: 12/16/2023 18:08   ECHOCARDIOGRAM COMPLETE Result Date: 12/16/2023    ECHOCARDIOGRAM REPORT   Patient Name:   DAYRON ODLAND Date of Exam: 12/16/2023 Medical Rec #:  [id number]     Height:       68.0 in Accession #:    [id number]    Weight:       250.4 lb Date of Birth:  01-25-1976    BSA:          2.249 m Patient Age:    47 years      BP:           149/85 mmHg Patient Gender: M             HR:           85 bpm. Exam Location:  Inpatient Procedure: 2D Echo, Cardiac Doppler, Color Doppler and Intracardiac            Opacification Agent Indications:   Stroke  History:       Patient has no prior history of Echocardiogram examinations.                Stroke.  Sonographer:   Lanell Maduro Referring      [id number] Phys: IMPRESSIONS  1. No apical thrombus. Left ventricular ejection fraction, by estimation, is 65 to 70%. The left ventricle has normal function. The left ventricle has no regional wall motion abnormalities. Left ventricular diastolic parameters are consistent with Grade  I diastolic dysfunction (impaired  relaxation).  2. Right ventricular systolic function is normal. The right ventricular size is normal. Tricuspid regurgitation signal is inadequate for assessing PA pressure.  3. The mitral valve is normal  in structure. No evidence of mitral valve regurgitation.  4. The aortic valve is tricuspid. Aortic valve regurgitation is not visualized.  5. The inferior vena cava is normal in size with greater than 50% respiratory variability, suggesting right atrial pressure of 3 mmHg. Comparison(s): No prior Echocardiogram. FINDINGS  Left Ventricle: No apical thrombus. Left ventricular ejection fraction, by estimation, is 65 to 70%. The left ventricle has normal function. The left ventricle has no regional wall motion abnormalities. Definity  contrast agent was given IV to delineate the left ventricular endocardial borders. The left ventricular internal cavity size was normal in size. There is no left ventricular hypertrophy. Left ventricular diastolic parameters are consistent with Grade I diastolic dysfunction (impaired relaxation). Indeterminate filling pressures. Right Ventricle: The right ventricular size is normal. No increase in right ventricular wall thickness. Right ventricular systolic function is normal. Tricuspid regurgitation signal is inadequate for assessing PA pressure. Left Atrium: Left atrial size was normal in size. Right Atrium: Right atrial size was normal in size. Pericardium: There is no evidence of pericardial effusion. Mitral Valve: The mitral valve is normal in structure. No evidence of mitral valve regurgitation. Tricuspid Valve: The tricuspid valve is normal in structure. Tricuspid valve regurgitation is not demonstrated. Aortic Valve: The aortic valve is tricuspid. Aortic valve regurgitation is not visualized. Pulmonic Valve: The pulmonic valve was normal in structure. Pulmonic valve regurgitation is not visualized. Aorta: The aortic root and ascending aorta are structurally normal, with no evidence of dilitation. Venous: The inferior vena cava is normal in size with greater than 50% respiratory variability, suggesting right atrial pressure of 3 mmHg. IAS/Shunts: No atrial level shunt detected by  color flow Doppler.  LEFT VENTRICLE PLAX 2D LVIDd:         4.10 cm      Diastology LVIDs:         2.40 cm      LV e' medial:    6.31 cm/s LV PW:         1.00 cm      LV E/e' medial:  11.8 LV IVS:        1.00 cm      LV e' lateral:   10.90 cm/s LVOT diam:     1.90 cm      LV E/e' lateral: 6.8 LV SV:         83 LV SV Index:   37 LVOT Area:     2.84 cm  LV Volumes (MOD) LV vol d, MOD A2C: 88.4 ml LV vol d, MOD A4C: 119.0 ml LV vol s, MOD A2C: 37.2 ml LV vol s, MOD A4C: 39.5 ml LV SV MOD A2C:     51.2 ml LV SV MOD A4C:     119.0 ml LV SV MOD BP:      65.0 ml RIGHT VENTRICLE RV Basal diam:  3.70 cm RV S prime:     19.00 cm/s TAPSE (M-mode): 2.6 cm LEFT ATRIUM             Index        RIGHT ATRIUM           Index LA diam:        3.30 cm 1.47 cm/m   RA Area:     12.90 cm LA Vol (A2C):   39.7  ml 17.65 ml/m  RA Volume:   27.90 ml  12.41 ml/m LA Vol (A4C):   44.4 ml 19.74 ml/m LA Biplane Vol: 43.0 ml 19.12 ml/m  AORTIC VALVE LVOT Vmax:   175.00 cm/s LVOT Vmean:  105.000 cm/s LVOT VTI:    0.294 m  AORTA Ao Root diam: 2.80 cm Ao Asc diam:  2.90 cm MITRAL VALVE MV Area (PHT): 4.89 cm    SHUNTS MV Decel Time: 155 msec    Systemic VTI:  0.29 m MV E velocity: 74.40 cm/s  Systemic Diam: 1.90 cm MV A velocity: 87.50 cm/s MV E/A ratio:  0.85 Vinie Maxcy MD Electronically signed by Vinie Maxcy MD Signature Date/Time: 12/16/2023/4:50:12 PM    Final    DG Chest Port 1 View Result Date: 12/16/2023 CLINICAL DATA:  Ventilator dependence. EXAM: PORTABLE CHEST 1 VIEW COMPARISON:  Earlier same day FINDINGS: Endotracheal tube tip is 2.7 cm above the base of the carina. The NG tube passes into the stomach although the distal tip position is not included on the film. Right IJ central line tip overlies the mid SVC. Low lung volumes, similar to prior. There is pulmonary vascular congestion without overt pulmonary edema. No evidence for pneumothorax. No substantial pleural effusion. Cardiopericardial silhouette is at upper limits of  normal for size. Telemetry leads overlie the chest. IMPRESSION: Low lung volumes with pulmonary vascular congestion. NG tube enters the stomach although distal tip has not been included on the film. Electronically Signed   By: Camellia Candle M.D.   On: 12/16/2023 07:21   CT ANGIO HEAD NECK W WO CM (CODE STROKE) Result Date: 12/16/2023 CLINICAL DATA:  Neuro deficit, acute, stroke suspected Please also do 5 min delay if it will not delay OR EXAM: CT ANGIOGRAPHY HEAD AND NECK WITH AND WITHOUT CONTRAST TECHNIQUE: Multidetector CT imaging of the head and neck was performed using the standard protocol during bolus administration of intravenous contrast. Multiplanar CT image reconstructions and MIPs were obtained to evaluate the vascular anatomy. Carotid stenosis measurements (when applicable) are obtained utilizing NASCET criteria, using the distal internal carotid diameter as the denominator. RADIATION DOSE REDUCTION: This exam was performed according to the departmental dose-optimization program which includes automated exposure control, adjustment of the mA and/or kV according to patient size and/or use of iterative reconstruction technique. CONTRAST:  75mL OMNIPAQUE  IOHEXOL  350 MG/ML SOLN COMPARISON:  Same day CT head. FINDINGS: CTA NECK FINDINGS Aortic arch: The vessel origins are patent. Right carotid system: No evidence of dissection, stenosis (50% or greater), or occlusion. Left carotid system: No evidence of dissection, stenosis (50% or greater), or occlusion. Vertebral arteries: Right dominant. No evidence of dissection, stenosis (50% or greater), or occlusion. Skeleton: No acute abnormality on limited assessment. Other neck: No acute abnormality on limited assessment. Upper chest: Visualized lung apices are clear. Review of the MIP images confirms the above findings CTA HEAD FINDINGS Anterior circulation: Bilateral intracranial ICAs, MCAs, and ACAs are patent without proximal hemodynamically significant  stenosis. No aneurysm identified. Posterior circulation: Bilateral intradural vertebral arteries, basilar artery and bilateral posterior cerebral are patent. Severe right P2 PCA stenosis mild left P2 PCA stenosis. No aneurysm or arteriovenous malformation identified; however, acute blood products limits assessment. Venous sinuses: As permitted by contrast timing, patent. Limited assessment due to arterial timing. Review of the MIP images confirms the above findings IMPRESSION: 1. No emergent large vessel occlusion. 2. Moderate right P2 PCA stenosis. 3. No aneurysm or vascular malformation identified; however, acute blood products limits assessment.  Electronically Signed   By: Gilmore GORMAN Molt M.D.   On: 12/16/2023 03:46   DG Chest Portable 1 View Result Date: 12/16/2023 CLINICAL DATA:  Tube placement EXAM: PORTABLE CHEST 1 VIEW COMPARISON:  None available FINDINGS: Endotracheal tube is 1.3 cm above the carina. NG tube tip is in the mid to distal esophagus. Low lung volumes. No confluent opacities or effusions. Heart is normal size. IMPRESSION: NG tube tip is in the mid to distal esophagus and should be advanced 10-15 cm. Low lung volumes. Electronically Signed   By: Franky Crease M.D.   On: 12/16/2023 03:13   CT HEAD CODE STROKE WO CONTRAST Result Date: 12/16/2023 CLINICAL DATA:  Code stroke. EXAM: CT HEAD WITHOUT CONTRAST TECHNIQUE: Contiguous axial images were obtained from the base of the skull through the vertex without intravenous contrast. RADIATION DOSE REDUCTION: This exam was performed according to the departmental dose-optimization program which includes automated exposure control, adjustment of the mA and/or kV according to patient size and/or use of iterative reconstruction technique. COMPARISON:  CT head 07/26/2021. FINDINGS: Brain: Acute 5.3 x 3.8 x 2.3 cm (estimated volume of 23 mL) intraparenchymal hemorrhage centered in the left cerebellum. The hemorrhage approaches the fourth ventricle without  definite intraventricular extension. Significant surrounding edema with mass effect and marked effacement of the fourth ventricle. No hydrocephalus at this time. Basal cistern effacement and early ascending transtentorial herniation. No evidence of acute large vascular territory infarct or visible mass lesion. Vascular: No hyperdense vessel. Skull: No acute fracture. Sinuses/Orbits: Mild paranasal sinus mucosal thickening. No acute orbital findings. Other: No mastoid effusions. ASPECTS [facility] Stroke Program Early CT Score) total score (0-10 with 10 being normal): 10. IMPRESSION: Large acute 5.3 cm intraparenchymal hemorrhage in the left cerebellum. Mass effect with narrowed fourth ventricle, but no hydrocephalus at this time. Basal cisterns are effaced and there is early ascending transtentorial herniation. Critical code stroke imaging results were communicated on 12/16/2023 at 1:45 am to provider Bhagat via secure text paging. Electronically Signed   By: Gilmore GORMAN Molt M.D.   On: 12/16/2023 01:53     Subjective: No acute issues or events overnight improving drastically, denies nausea vomiting diarrhea constipation headache fevers chills or chest pain   Discharge Exam: Vitals:   12/20/23 0745 12/20/23 0800  BP: 133/78   Pulse: 69   Resp: (!) 22   Temp:  98.5 F (36.9 C)  SpO2: 94%    Vitals:   12/20/23 0500 12/20/23 0600 12/20/23 0745 12/20/23 0800  BP:   133/78   Pulse: 64 62 69   Resp: (!) 22 (!) 21 (!) 22   Temp:    98.5 F (36.9 C)  TempSrc:    Oral  SpO2: 93% 92% 94%   Weight:      Height:        General: Pt is alert, awake, not in acute distress Cardiovascular: RRR, S1/S2 +, no rubs, no gallops Respiratory: CTA bilaterally, no wheezing, no rhonchi Abdominal: Soft, NT, ND, bowel sounds + Extremities: no edema, no cyanosis    The results of significant diagnostics from this hospitalization (including imaging, microbiology, ancillary and laboratory) are listed below for  reference.     Microbiology: Recent Results (from the past 240 hours)  MRSA Next Gen by PCR, Nasal     Status: None   Collection Time: 12/16/23  6:50 AM   Specimen: Nasal Mucosa; Nasal Swab  Result Value Ref Range Status   MRSA by PCR Next Gen NOT DETECTED NOT DETECTED  Final    Comment: (NOTE) The GeneXpert MRSA Assay (FDA approved for NASAL specimens only), is one component of a comprehensive MRSA colonization surveillance program. It is not intended to diagnose MRSA infection nor to guide or monitor treatment for MRSA infections. Test performance is not FDA approved in patients less than 80 years old. Performed at [facility], [address], [place], [place] [address]      Labs: BNP (last 3 results) No results for input(s): BNP in the last 8760 hours. Basic Metabolic Panel: Recent Labs  Lab 12/16/23 0718 12/16/23 0748 12/17/23 0617 12/17/23 0815 12/18/23 0521 12/18/23 0524 12/18/23 0804 12/18/23 1315 12/19/23 0245 12/19/23 0840 12/19/23 1653 12/20/23 0522  NA 141   < > 142   < >  --  143   < > 142 138 141 137 136  K 4.0   < > 4.2  --   --  3.7  --   --  3.5  --  3.7 3.9  CL 110  --  108  --   --  110  --   --  108  --  104 103  CO2 20*  --  25  --   --  25  --   --  25  --  25 23  GLUCOSE 140*  --  134*  --   --  113*  --   --  163*  --  130* 122*  BUN 11  --  9  --   --  10  --   --  7  --  9 21*  CREATININE 1.01  --  0.99  --   --  0.91  --   --  1.01  --  0.88 1.07  CALCIUM  8.6*  --  8.9  --   --  8.7*  --   --  8.8*  --  9.5 9.4  MG 1.8  --   --   --  2.3  --   --   --   --   --   --   --    < > = values in this interval not displayed.   Liver Function Tests: Recent Labs  Lab 12/16/23 0136 12/17/23 0617  AST 51* 38  ALT 76* 54*  ALKPHOS 98 73  BILITOT 0.7 0.5  PROT 7.4 6.3*  ALBUMIN  4.0 3.2*   No results for input(s): LIPASE, AMYLASE in the last 168 hours. No results for input(s): AMMONIA in the last 168 hours. CBC: Recent Labs   Lab 12/16/23 0136 12/16/23 0141 12/16/23 0718 12/16/23 0748 12/17/23 0617 12/18/23 0524 12/19/23 0245  WBC 9.3  --  4.8  --  11.7* 10.8* 9.4  NEUTROABS 1.8  --   --   --   --   --   --   HGB 14.7   < > 11.9* 11.9* 12.2* 11.6* 12.0*  HCT 46.0   < > 36.3* 35.0* 38.6* 36.6* 37.6*  MCV 86.1  --  83.6  --  86.0 86.5 85.3  PLT 297  --  226  --  242 208 207   < > = values in this interval not displayed.   Cardiac Enzymes: No results for input(s): CKTOTAL, CKMB, CKMBINDEX, TROPONINI in the last 168 hours. BNP: Invalid input(s): POCBNP CBG: Recent Labs  Lab 12/19/23 1532 12/19/23 1943 12/19/23 2344 12/20/23 0333 12/20/23 0710  GLUCAP 95 113* 105* 114* 103*   D-Dimer No results for input(s): DDIMER in  the last 72 hours. Hgb A1c No results for input(s): HGBA1C in the last 72 hours. Lipid Profile No results for input(s): CHOL, HDL, LDLCALC, TRIG, CHOLHDL, LDLDIRECT in the last 72 hours. Thyroid function studies No results for input(s): TSH, T4TOTAL, T3FREE, THYROIDAB in the last 72 hours.  Invalid input(s): FREET3 Anemia work up No results for input(s): VITAMINB12, FOLATE, FERRITIN, TIBC, IRON, RETICCTPCT in the last 72 hours. Urinalysis No results found for: COLORURINE, APPEARANCEUR, LABSPEC, PHURINE, GLUCOSEU, HGBUR, BILIRUBINUR, KETONESUR, PROTEINUR, UROBILINOGEN, NITRITE, LEUKOCYTESUR Sepsis Labs Recent Labs  Lab 12/16/23 0718 12/17/23 0617 12/18/23 0524 12/19/23 0245  WBC 4.8 11.7* 10.8* 9.4   Microbiology Recent Results (from the past 240 hours)  MRSA Next Gen by PCR, Nasal     Status: None   Collection Time: 12/16/23  6:50 AM   Specimen: Nasal Mucosa; Nasal Swab  Result Value Ref Range Status   MRSA by PCR Next Gen NOT DETECTED NOT DETECTED Final    Comment: (NOTE) The GeneXpert MRSA Assay (FDA approved for NASAL specimens only), is one component of a comprehensive MRSA colonization  surveillance program. It is not intended to diagnose MRSA infection nor to guide or monitor treatment for MRSA infections. Test performance is not FDA approved in patients less than 21 years old. Performed at [facility], [address] [place], [place] [address]      Time coordinating discharge: Over 30 minutes  SIGNED:   Elsie JAYSON Montclair, DO [facility] 12/20/2023, 11:58 AM Pager   If 7PM-7AM, please contact night-coverage [email or web address]

## 2023-12-20 NOTE — Progress Notes (Signed)
  Inpatient Rehabilitation Admissions Coordinator   I have insurance approval and CIR bed to admit patient today. I met with patient and spouse at bedside and they are in agreement.  Acute team and TOC made aware. I will make the arrangements.  Heron Leavell, RN, MSN Rehab Admissions Coordinator [phone] 12/20/2023 11:55 AM

## 2023-12-20 NOTE — Progress Notes (Signed)
 Inpatient Rehabilitation Admission Medication Review by a Pharmacist  A complete drug regimen review was completed for this patient to identify any potential clinically significant medication issues.  High Risk Drug Classes Is patient taking? Indication by Medication  Antipsychotic Yes Compazine  (PO/PR/IV) - prn nausea  Anticoagulant Yes Heparin  SQ - VTE ppx  Antibiotic No   Opioid Yes Tramadol  - prn pain  Antiplatelet No   Hypoglycemics/insulin  No   Vasoactive Medication No   Chemotherapy No   Other Yes Atorvastatin  - HLD Gabapentin , topiramate  - headaches Benadryl  - prn itching Robitussin DM - prn cough Meclizine  - prn dizziness Trazodone  - prn sleep     Type of Medication Issue Identified Description of Issue Recommendation(s)  Drug Interaction(s) (clinically significant)     Duplicate Therapy     Allergy     No Medication Administration End Date     Incorrect Dose     Additional Drug Therapy Needed     Significant med changes from prior encounter (inform family/care partners about these prior to discharge). Stopping fioricet     Other       Clinically significant medication issues were identified that warrant physician communication and completion of prescribed/recommended actions by midnight of the next day:  No  Name of provider notified for urgent issues identified:   Provider Method of Notification:     Pharmacist comments:   Time spent performing this drug regimen review (minutes):  15  Prudence Dollar, PharmD, BCPS 12/20/2023 5:57 PM

## 2023-12-20 NOTE — PMR Pre-admission (Signed)
 PMR Admission Coordinator Pre-Admission Assessment  Patient: Wayne Lee is an 48 y.o., male MRN: [id number] DOB: October 08, 1976 Height: 5' 8 (172.7 cm)Weight: 113.6 kg             Insurance Information HMO:     PPO:      PCP:      IPA:      80/20:      OTHER:  PRIMARY: [employer] POS [employer]      Policy#: [id number]      Subscriber: pt CM Name: Marcelline Davis      Phone#: [phone]     Fax#: [phone] Pre-Cert#: [id number] approved 1/10 until 1/16      Employer:  Benefits:  Phone #: [phone]     Name: 1/9 Eff. Date: 12/11/23     Deduct: $1250      Out of Pocket Max: $4890      Life Max: none  CIR: 80%      SNF: 80% Outpatient: 80%     Co-Pay:  Home Health: 80%      Co-Pay:  DME: 80%     Co-Pay:  Providers: in network  SECONDARY: none  Financial Counselor:       Phone#:   The Data Processing Manager" for patients in Inpatient Rehabilitation Facilities with attached "Privacy Act Statement-Health Care Records" was provided and verbally reviewed with: Patient and Family  Emergency Contact Information Contact Information   None on File    Other Contacts     Name Relation Home Work Mobile   Steckman,Adda Spouse   [phone]      Current Medical History  Patient Admitting Diagnosis: cerebellar hemorrhage  History of Present Illness: Wayne Lee is a 48 y.o. male with a history of obesity and sleep apnea who presented on 12/15/2022 with severe headache and dizziness.   Blood pressure upon presentation was 204/106.  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  Basal cisterns were effaced with early ascending transtentorial herniation.  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  The patient was evaluated by neurosurgery and underwent a suboccipital craniectomy with decompression of  the cerebellar hemorrhage by Dr. Victory Gens.  Patient was extubated the next day and placed on hypertonic saline to manage cerebellar edema.  He was started on a full liquid diet after bedside swallow screen.  He reported postoperative headaches and pain at the surgical site.  HTN emergency resolved and amlodipine  discontinued to avoid hypotension. Fever resolved and felt likely reactive. HLD and to continue Statin.    Complete NIHSS TOTAL: 2 Glasgow Coma Scale Score: 14  Patient's medical record from [facility] has been reviewed by the rehabilitation admission coordinator and physician.  Past Medical History  History reviewed. No pertinent past medical history.  Has the patient had major surgery during 100 days prior to admission? Yes  Family History  family history is not on file.  Current Medications   Current Facility-Administered Medications:    0.9 %  sodium chloride  infusion (Manually program via Guardrails IV Fluids), , Intravenous, Once, Gens Victory, MD   acetaminophen  (TYLENOL ) tablet 650 mg, 650 mg, Oral, Q4H PRN, Tanda Powell ORN, RPH, 650 mg at 12/20/23 9057   atorvastatin  (LIPITOR) tablet 20 mg, 20 mg, Oral, Daily, Jerri Pfeiffer, MD, 20 mg at 12/20/23 9057   bisacodyl  (  DULCOLAX) suppository 10 mg, 10 mg, Rectal, Daily PRN, Colon Shove, MD   butalbital -acetaminophen -caffeine  (FIORICET ) 50-325-40 MG per tablet 1 tablet, 1 tablet, Oral, Q6H PRN, Paliwal, Aditya, MD, 1 tablet at 12/20/23 0913   Chlorhexidine  Gluconate Cloth 2 % PADS 6 each, 6 each, Topical, Daily, Colon Shove, MD, 6 each at 12/20/23 1011   heparin  injection 5,000 Units, 5,000 Units, Subcutaneous, Q8H, Lue Elsie BROCKS, MD   labetalol  (NORMODYNE ) injection 10-40 mg, 10-40 mg, Intravenous, Q10 min PRN, Colon Shove, MD, 20 mg at 12/17/23 1510   ondansetron  (ZOFRAN ) tablet 4 mg, 4 mg, Oral, Q4H PRN **OR** ondansetron  (ZOFRAN ) injection 4 mg, 4 mg, Intravenous, Q4H PRN, Colon Shove, MD   Oral  care mouth rinse, 15 mL, Mouth Rinse, PRN, Harold, Sudham, MD   polyethylene glycol (MIRALAX  / GLYCOLAX ) packet 17 g, 17 g, Oral, Daily, Elsner, Henry, MD, 17 g at 12/20/23 9057   polyvinyl alcohol  (LIQUIFILM TEARS) 1.4 % ophthalmic solution 2 drop, 2 drop, Both Eyes, PRN, Antonetta Moccasin B, NP, 2 drop at 12/17/23 1232   promethazine  (PHENERGAN ) tablet 12.5-25 mg, 12.5-25 mg, Oral, Q4H PRN, Colon Shove, MD   senna-docusate (Senokot-S) tablet 1 tablet, 1 tablet, Oral, BID, Colon Shove, MD, 1 tablet at 12/20/23 9057   sodium chloride  flush (NS) 0.9 % injection 3 mL, 3 mL, Intravenous, Once, Colon Shove, MD   sodium phosphate  (FLEET) enema 1 enema, 1 enema, Rectal, Once PRN, Colon Shove, MD  Patients Current Diet:  Diet Order             Diet regular Room service appropriate? Yes; Fluid consistency: Thin  Diet effective now                  Precautions / Restrictions Precautions Precautions: Fall Precaution Comments: watch BP Restrictions Weight Bearing Restrictions Per Provider Order: No   Has the patient had 2 or more falls or a fall with injury in the past year?No  Prior Activity Level Community (5-7x/wk): independent, working and driving  Prior Functional Level Prior Function Prior Level of Function : Independent/Modified Independent  Self Care: Did the patient need help bathing, dressing, using the toilet or eating?  Independent  Indoor Mobility: Did the patient need assistance with walking from room to room (with or without device)? Independent  Stairs: Did the patient need assistance with internal or external stairs (with or without device)? Independent  Functional Cognition: Did the patient need help planning regular tasks such as shopping or remembering to take medications? Independent  Patient Information Are you of Hispanic, Latino/a,or Spanish origin?: A. No, not of Hispanic, Latino/a, or Spanish origin What is your race?: B. Black or African American Do  you need or want an interpreter to communicate with a doctor or health care staff?: 0. No  Patient's Response To:  Health Literacy and Transportation Is the patient able to respond to health literacy and transportation needs?: Yes Health Literacy - How often do you need to have someone help you when you read instructions, pamphlets, or other written material from your doctor or pharmacy?: Never In the past 12 months, has lack of transportation kept you from medical appointments or from getting medications?: No In the past 12 months, has lack of transportation kept you from meetings, work, or from getting things needed for daily living?: No  Home Assistive Devices / Equipment Home Equipment: None  Prior Device Use: Indicate devices/aids used by the patient prior to current illness, exacerbation or injury? None of the above  Current Functional Level Cognition  Arousal/Alertness: Awake/alert Overall Cognitive Status: Impaired/Different from baseline Orientation Level: Oriented X4 General Comments: increased time to respond , clue for sustain attention Attention: Sustained Sustained Attention: Impaired Sustained Attention Impairment: Verbal basic, Functional basic Memory: Impaired Memory Impairment: Retrieval deficit Awareness: Impaired Awareness Impairment: Emergent impairment Problem Solving: Impaired Problem Solving Impairment: Verbal basic    Extremity Assessment (includes Sensation/Coordination)  Upper Extremity Assessment: Right hand dominant, LUE deficits/detail LUE Deficits / Details: decrease grasp with RW noted during functional task LUE Sensation: decreased light touch LUE Coordination: decreased fine motor, decreased gross motor  Lower Extremity Assessment: Defer to PT evaluation    ADLs  Overall ADL's : Needs assistance/impaired Eating/Feeding: Minimal assistance Grooming: Minimal assistance Lower Body Dressing: Maximal assistance Lower Body Dressing Details  (indicate cue type and reason): pt (A) with hand over hand to get sock in hands. pt with total (A) to place R LE in figure 4 cross. pt reaching with L UE to pull sock over heel that was backward chained for the patient Toilet Transfer: +2 for physical assistance, Moderate assistance, Cueing for safety, Regular Toilet, Grab bars (standing) Toileting- Clothing Manipulation and Hygiene: Maximal assistance Functional mobility during ADLs: +2 for physical assistance, +2 for safety/equipment, Moderate assistance, Cueing for sequencing, Cueing for safety, Rolling walker (2 wheels) General ADL Comments: wife present entire session    Mobility  Overal bed mobility: Needs Assistance Bed Mobility: Supine to Sit, Sit to Supine Supine to sit: Min assist, +2 for physical assistance General bed mobility comments: initial struggle bring weight from supine to L side needing light min assist.  Once up balance was variable and unsteady with pt staying upright with effort.    Transfers  Overall transfer level: Needs assistance Equipment used: Sliding board Transfers: Sit to/from Stand Sit to Stand: Min assist General transfer comment: cues for hand placement, min stability assist with some forward assist, but no need for boost.    Ambulation / Gait / Stairs / Wheelchair Mobility  Ambulation/Gait Ambulation/Gait assistance: Mod assist Gait Distance (Feet): 300 Feet (with 3 standing or correction breaks) Assistive device: 1 person hand held assist Gait Pattern/deviations: Step-through pattern General Gait Details: .gait unsteady overall with less wide BOS, drifting L and R with moderate lateral perturbations  pt began heading for the rails midway through the gait cycle.  Worsening with fatigue.  Overall improved, but still a difficult assist. Gait velocity interpretation: <1.8 ft/sec, indicate of risk for recurrent falls    Posture / Balance Dynamic Sitting Balance Sitting balance - Comments: could maintain  sitting CGA for periods, but unable to walk away due to 1 moment later very unsteady needing min assist. Balance Overall balance assessment: Needs assistance Sitting-balance support: Single extremity supported, No upper extremity supported, Feet supported Sitting balance-Leahy Scale: Poor (to poor) Sitting balance - Comments: could maintain sitting CGA for periods, but unable to walk away due to 1 moment later very unsteady needing min assist. Standing balance support: Single extremity supported, During functional activity Standing balance-Leahy Scale: Poor Standing balance comment: external stability necessary during standing urination. High Level Balance Comments: pt with ambulation opening eyes and immediate LOB toward the L side and required max -total (A) to come back to midline    Special needs/care consideration No history of HTN prior to admit   Previous Home Environment  Living Arrangements: Spouse/significant other  Lives With: Spouse Available Help at Discharge: Family, Available 24 hours/day Type of Home: House Home Layout: Two level Alternate  Level Stairs-Number of Steps: full flight Home Access: Stairs to enter Entrance Stairs-Rails: Can reach both Entrance Stairs-Number of Steps: 4 Bathroom Shower/Tub: Tub/shower unit (likes tub) Bathroom Toilet: Standard Additional Comments: works emergency planning/management officer, practicing therapist, 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  Discharge Living Setting Plans for Discharge Living Setting: Patient's home, House, Lives with (comment) (wife. 14 year old and 59 month old) Type of Home at Discharge: House Discharge Home Layout: Two level Alternate Level Stairs-Number of Steps: full flight Discharge Home Access: Stairs to enter Entrance Stairs-Rails: Can reach both Entrance Stairs-Number of Steps: 4 Discharge Bathroom Shower/Tub: Tub/shower unit Discharge Bathroom Toilet: Standard Discharge Bathroom Accessibility: Yes How  Accessible: Accessible via walker Does the patient have any problems obtaining your medications?: No  Social/Family/Support Systems Patient Roles: Spouse, Parent (employee) Contact Information: wife, Adda Anticipated Caregiver: wife and family Anticipated Caregiver's Contact Information: see contacts Ability/Limitations of Caregiver: no limitations Caregiver Availability: 24/7 Discharge Plan Discussed with Primary Caregiver: Yes Is Caregiver In Agreement with Plan?: Yes Does Caregiver/Family have Issues with Lodging/Transportation while Pt is in Rehab?: No  Goals Patient/Family Goal for Rehab: Mod I to supervision with PT, OT and SLP Expected length of stay: ELOS 9 to 14 days Pt/Family Agrees to Admission and willing to participate: Yes Program Orientation Provided & Reviewed with Pt/Caregiver Including Roles  & Responsibilities: Yes  Decrease burden of Care through IP rehab admission: n/a  Possible need for SNF placement upon discharge:not anticipated  Patient Condition: This patient's condition remains as documented in the consult dated 12/19/23, in which the Rehabilitation Physician determined and documented that the patient's condition is appropriate for intensive rehabilitative care in an inpatient rehabilitation facility. Will admit to inpatient rehab today.  Preadmission Screen Completed By:  Alison Heron Lot, RN MSN 12/20/2023 12:23 PM ______________________________________________________________________   Discussed status with Dr. Babs on 12/20/23 at 1222 and received approval for admission today.  Admission Coordinator:  Alison Heron Lot, RN MSN time [phone] Date1/10/25

## 2023-12-20 NOTE — Progress Notes (Signed)
 Speech Language Pathology Treatment: Cognitive-Linquistic  Patient Details Name: Wayne Lee MRN: [id number] DOB: 03-Jan-1976 Today's Date: 12/20/2023 Time: 8977-8959 SLP Time Calculation (min) (ACUTE ONLY): 18 min  Assessment / Plan / Recommendation Clinical Impression  Wayne Lee was very motivated to participate. His wife was at the bedside.  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  He demonstrates much improved attention, affect; no further dysarthria.  He listened to a newspaper article and was able to recall 7/7 facts from story independently. Demonstrated good use of humor throughout session.  He is hoping for transition to CIR. Will benefit from further cognitive assessment once admitted to address complex/higher-level skills. He and his wife agree with plan.    HPI HPI: Wayne Lee is a 48 yo male presenting to ED 1/6 with headache, dizziness, two episodes of emesis, and L sided weakness. CTH shows acute L cerebellar IPH with mass effect. Underwent emergent suboccipital craniotomy 1/6 and remained intubated after the procedure. Extubated 1/7. PMH includes HLD, obesity, possible OSA      SLP Plan  Continue with current plan of care      Recommendations for follow up therapy are one component of a multi-disciplinary discharge planning process, led by the attending physician.  Recommendations may be updated based on patient status, additional functional criteria and insurance authorization.    Recommendations   CIR                  Oral care BID     Cognitive communication deficit (R41.841)     Continue with current plan of care    Wayne Lee L. Vona, MA CCC/SLP Clinical Specialist - Acute Care SLP Acute Rehabilitation Services Office number [phone]  Wayne Lee  12/20/2023, 10:40 AM

## 2023-12-20 NOTE — Progress Notes (Signed)
 Babs Arthea DASEN, MD  Physician Physical Medicine and Rehabilitation   PMR Pre-admission     Signed   Date of Service: 12/20/2023 12:13 PM  Related encounter: ED to Hosp-Admission (Current) from 12/16/2023 in [facility] NEURO/TRAUMA/SURGICAL ICU   Signed     Expand All Collapse All  Show:Clear all [x] Written[x] Templated[x] Copied  Added by: [x] Alison Heron MATSU, RN  [] Hover for details PMR Admission Coordinator Pre-Admission Assessment   Patient: Wayne Lee is an 48 y.o., male MRN: [id number] DOB: [birth year] Height: 5' 8 (172.7 cm)Weight: 113.6 kg                                                                                                                                      Insurance Information HMO:     PPO:      PCP:      IPA:      80/20:      OTHER:  PRIMARY: [employer] Choice POS II/[place] [employer]      Policy#: [id number]      Subscriber: pt CM Name: Marcelline Davis      Phone#: [phone]     Fax#: [phone] Pre-Cert#: [id number] approved 1/10 until 1/16      Employer:  Benefits:  Phone #: [phone]     Name: 1/9 Eff. Date: 12/11/23     Deduct: $1250      Out of Pocket Max: $4890      Life Max: none  CIR: 80%      SNF: 80% Outpatient: 80%     Co-Pay:  Home Health: 80%      Co-Pay:  DME: 80%     Co-Pay:  Providers: in network  SECONDARY: none   Financial Counselor:       Phone#:    The Data Processing Manager" for patients in Inpatient Rehabilitation Facilities with attached "Privacy Act Statement-Health Care Records" was provided and verbally reviewed with: Patient and Family   Emergency Contact Information Contact Information   None on File      Other Contacts       Name Relation Home Work Mobile    Vogel,Adda Spouse     [phone]         Current Medical History  Patient Admitting Diagnosis: cerebellar hemorrhage   History of Present Illness: Danny Yackley is a 48 y.o. male with a history of obesity and sleep apnea  who presented on 12/15/2022 with severe headache and dizziness.    Blood pressure upon presentation was 204/106.  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  Basal cisterns were effaced with early ascending transtentorial herniation.  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  The patient was evaluated by neurosurgery and underwent a suboccipital craniectomy with decompression of  the cerebellar hemorrhage by Dr. Victory Gens.  Patient was extubated the next day and placed on hypertonic saline to manage cerebellar edema.  He was started on a full liquid diet after bedside swallow screen.  He reported postoperative headaches and pain at the surgical site.  HTN emergency resolved and amlodipine  discontinued to avoid hypotension. Fever resolved and felt likely reactive. HLD and to continue Statin.    Complete NIHSS TOTAL: 2 Glasgow Coma Scale Score: 14   Patient's medical record from [facility] has been reviewed by the rehabilitation admission coordinator and physician.   Past Medical History  History reviewed. No pertinent past medical history.       Has the patient had major surgery during 100 days prior to admission? Yes   Family History  family history is not on file.   Current Medications   Current Medications    Current Facility-Administered Medications:    0.9 %  sodium chloride  infusion (Manually program via Guardrails IV Fluids), , Intravenous, Once, Gens Victory, MD   acetaminophen  (TYLENOL ) tablet 650 mg, 650 mg, Oral, Q4H PRN, Tanda Powell ORN, RPH, 650 mg at 12/20/23 9057   atorvastatin  (LIPITOR) tablet 20 mg, 20 mg, Oral, Daily, Jerri Pfeiffer, MD, 20 mg at 12/20/23 9057   bisacodyl  (DULCOLAX) suppository 10 mg, 10 mg, Rectal, Daily PRN, Gens Victory, MD   butalbital -acetaminophen -caffeine  (FIORICET ) 50-325-40  MG per tablet 1 tablet, 1 tablet, Oral, Q6H PRN, Paliwal, Aditya, MD, 1 tablet at 12/20/23 0913   Chlorhexidine  Gluconate Cloth 2 % PADS 6 each, 6 each, Topical, Daily, Gens Victory, MD, 6 each at 12/20/23 1011   heparin  injection 5,000 Units, 5,000 Units, Subcutaneous, Q8H, Lue Elsie BROCKS, MD   labetalol  (NORMODYNE ) injection 10-40 mg, 10-40 mg, Intravenous, Q10 min PRN, Gens Victory, MD, 20 mg at 12/17/23 1510   ondansetron  (ZOFRAN ) tablet 4 mg, 4 mg, Oral, Q4H PRN **OR** ondansetron  (ZOFRAN ) injection 4 mg, 4 mg, Intravenous, Q4H PRN, Gens Victory, MD   Oral care mouth rinse, 15 mL, Mouth Rinse, PRN, Harold, Sudham, MD   polyethylene glycol (MIRALAX  / GLYCOLAX ) packet 17 g, 17 g, Oral, Daily, Elsner, Henry, MD, 17 g at 12/20/23 9057   polyvinyl alcohol  (LIQUIFILM TEARS) 1.4 % ophthalmic solution 2 drop, 2 drop, Both Eyes, PRN, Antonetta Moccasin B, NP, 2 drop at 12/17/23 1232   promethazine  (PHENERGAN ) tablet 12.5-25 mg, 12.5-25 mg, Oral, Q4H PRN, Gens Victory, MD   senna-docusate (Senokot-S) tablet 1 tablet, 1 tablet, Oral, BID, Gens Victory, MD, 1 tablet at 12/20/23 9057   sodium chloride  flush (NS) 0.9 % injection 3 mL, 3 mL, Intravenous, Once, Gens Victory, MD   sodium phosphate  (FLEET) enema 1 enema, 1 enema, Rectal, Once PRN, Gens Victory, MD     Patients Current Diet:  Diet Order                  Diet regular Room service appropriate? Yes; Fluid consistency: Thin  Diet effective now                       Precautions / Restrictions Precautions Precautions: Fall Precaution Comments: watch BP Restrictions Weight Bearing Restrictions Per Provider Order: No    Has the patient had 2 or more falls or a fall with injury in the past year?No   Prior Activity Level Community (5-7x/wk): independent, working and driving   Prior Functional Level Prior Function Prior Level of Function : Independent/Modified Independent   Self Care: Did the  patient need help bathing,  dressing, using the toilet or eating?  Independent   Indoor Mobility: Did the patient need assistance with walking from room to room (with or without device)? Independent   Stairs: Did the patient need assistance with internal or external stairs (with or without device)? Independent   Functional Cognition: Did the patient need help planning regular tasks such as shopping or remembering to take medications? Independent   Patient Information Are you of Hispanic, Latino/a,or Spanish origin?: A. No, not of Hispanic, Latino/a, or Spanish origin What is your race?: B. Black or African American Do you need or want an interpreter to communicate with a doctor or health care staff?: 0. No   Patient's Response To:  Health Literacy and Transportation Is the patient able to respond to health literacy and transportation needs?: Yes Health Literacy - How often do you need to have someone help you when you read instructions, pamphlets, or other written material from your doctor or pharmacy?: Never In the past 12 months, has lack of transportation kept you from medical appointments or from getting medications?: No In the past 12 months, has lack of transportation kept you from meetings, work, or from getting things needed for daily living?: No   Home Assistive Devices / Equipment Home Equipment: None   Prior Device Use: Indicate devices/aids used by the patient prior to current illness, exacerbation or injury? None of the above   Current Functional Level Cognition   Arousal/Alertness: Awake/alert Overall Cognitive Status: Impaired/Different from baseline Orientation Level: Oriented X4 General Comments: increased time to respond , clue for sustain attention Attention: Sustained Sustained Attention: Impaired Sustained Attention Impairment: Verbal basic, Functional basic Memory: Impaired Memory Impairment: Retrieval deficit Awareness: Impaired Awareness Impairment: Emergent impairment Problem  Solving: Impaired Problem Solving Impairment: Verbal basic    Extremity Assessment (includes Sensation/Coordination)   Upper Extremity Assessment: Right hand dominant, LUE deficits/detail LUE Deficits / Details: decrease grasp with RW noted during functional task LUE Sensation: decreased light touch LUE Coordination: decreased fine motor, decreased gross motor  Lower Extremity Assessment: Defer to PT evaluation     ADLs   Overall ADL's : Needs assistance/impaired Eating/Feeding: Minimal assistance Grooming: Minimal assistance Lower Body Dressing: Maximal assistance Lower Body Dressing Details (indicate cue type and reason): pt (A) with hand over hand to get sock in hands. pt with total (A) to place R LE in figure 4 cross. pt reaching with L UE to pull sock over heel that was backward chained for the patient Toilet Transfer: +2 for physical assistance, Moderate assistance, Cueing for safety, Regular Toilet, Grab bars (standing) Toileting- Clothing Manipulation and Hygiene: Maximal assistance Functional mobility during ADLs: +2 for physical assistance, +2 for safety/equipment, Moderate assistance, Cueing for sequencing, Cueing for safety, Rolling walker (2 wheels) General ADL Comments: wife present entire session     Mobility   Overal bed mobility: Needs Assistance Bed Mobility: Supine to Sit, Sit to Supine Supine to sit: Min assist, +2 for physical assistance General bed mobility comments: initial struggle bring weight from supine to L side needing light min assist.  Once up balance was variable and unsteady with pt staying upright with effort.     Transfers   Overall transfer level: Needs assistance Equipment used: Sliding board Transfers: Sit to/from Stand Sit to Stand: Min assist General transfer comment: cues for hand placement, min stability assist with some forward assist, but no need for boost.     Ambulation / Gait / Stairs / Psychologist, [employer]  Ambulation/Gait Ambulation/Gait assistance: Mod assist Gait Distance (Feet): 300 Feet (with 3 standing or correction breaks) Assistive device: 1 person hand held assist Gait Pattern/deviations: Step-through pattern General Gait Details: .gait unsteady overall with less wide BOS, drifting L and R with moderate lateral perturbations  pt began heading for the rails midway through the gait cycle.  Worsening with fatigue.  Overall improved, but still a difficult assist. Gait velocity interpretation: <1.8 ft/sec, indicate of risk for recurrent falls     Posture / Balance Dynamic Sitting Balance Sitting balance - Comments: could maintain sitting CGA for periods, but unable to walk away due to 1 moment later very unsteady needing min assist. Balance Overall balance assessment: Needs assistance Sitting-balance support: Single extremity supported, No upper extremity supported, Feet supported Sitting balance-Leahy Scale: Poor (to poor) Sitting balance - Comments: could maintain sitting CGA for periods, but unable to walk away due to 1 moment later very unsteady needing min assist. Standing balance support: Single extremity supported, During functional activity Standing balance-Leahy Scale: Poor Standing balance comment: external stability necessary during standing urination. High Level Balance Comments: pt with ambulation opening eyes and immediate LOB toward the L side and required max -total (A) to come back to midline     Special needs/care consideration No history of HTN prior to admit    Previous Home Environment  Living Arrangements: Spouse/significant other  Lives With: Spouse Available Help at Discharge: Family, Available 24 hours/day Type of Home: House Home Layout: Two level Alternate Level Stairs-Number of Steps: full flight Home Access: Stairs to enter Entrance Stairs-Rails: Can reach both Entrance Stairs-Number of Steps: 4 Bathroom Shower/Tub: Tub/shower unit (likes tub) Bathroom  Toilet: Standard Additional Comments: works emergency planning/management officer, practicing therapist, 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   Discharge Living Setting Plans for Discharge Living Setting: Patient's home, House, Lives with (comment) (wife. 78 year old and 10 month old) Type of Home at Discharge: House Discharge Home Layout: Two level Alternate Level Stairs-Number of Steps: full flight Discharge Home Access: Stairs to enter Entrance Stairs-Rails: Can reach both Entrance Stairs-Number of Steps: 4 Discharge Bathroom Shower/Tub: Tub/shower unit Discharge Bathroom Toilet: Standard Discharge Bathroom Accessibility: Yes How Accessible: Accessible via walker Does the patient have any problems obtaining your medications?: No   Social/Family/Support Systems Patient Roles: Spouse, Parent (employee) Contact Information: wife, Adda Anticipated Caregiver: wife and family Anticipated Caregiver's Contact Information: see contacts Ability/Limitations of Caregiver: no limitations Caregiver Availability: 24/7 Discharge Plan Discussed with Primary Caregiver: Yes Is Caregiver In Agreement with Plan?: Yes Does Caregiver/Family have Issues with Lodging/Transportation while Pt is in Rehab?: No   Goals Patient/Family Goal for Rehab: Mod I to supervision with PT, OT and SLP Expected length of stay: ELOS 9 to 14 days Pt/Family Agrees to Admission and willing to participate: Yes Program Orientation Provided & Reviewed with Pt/Caregiver Including Roles  & Responsibilities: Yes   Decrease burden of Care through IP rehab admission: n/a   Possible need for SNF placement upon discharge:not anticipated   Patient Condition: This patient's condition remains as documented in the consult dated 12/19/23, in which the Rehabilitation Physician determined and documented that the patient's condition is appropriate for intensive rehabilitative care in an inpatient rehabilitation facility. Will admit to inpatient rehab  today.   Preadmission Screen Completed By:  Alison Heron Lot, RN MSN 12/20/2023 12:23 PM ______________________________________________________________________   Discussed status with Dr. Babs on 12/20/23 at 1222 and received approval for admission today.   Admission Coordinator:  Alison,  Heron Lot, RN MSN time [phone] Date1/10/25            Revision History

## 2023-12-20 NOTE — Progress Notes (Signed)
 Occupational Therapy Treatment Patient Details Name: Wayne Lee MRN: [id number] DOB: July 11, 1976 Today's Date: 12/20/2023   History of present illness 48 yo male arrival 1/6 with HTN 204/106 taken to emergent OR hematoma evacuation s/p suboccipital craniotomy and hypertonic saline started. Extubated 1/7 PMH obesity, sleep apnea, HLD   OT comments  Patient demonstrating good gains with OT treatment with min assist to get to EOB. Patient able to perform oral hygiene seated on EOB with supervision and able to ambulate to bathroom with min assist. Patient stood at sink for hand hygiene and used sink for support. Patient asked to return to bed due to headache.  Patient will benefit from intensive inpatient follow up therapy, >3 hours/day.      If plan is discharge home, recommend the following:  A little help with walking and/or transfers;A lot of help with bathing/dressing/bathroom   Equipment Recommendations  BSC/3in1;Wheelchair (measurements OT);Wheelchair cushion (measurements OT)    Recommendations for Other Services      Precautions / Restrictions Precautions Precautions: Fall Precaution Comments: watch BP Restrictions Weight Bearing Restrictions Per Provider Order: No       Mobility Bed Mobility Overal bed mobility: Needs Assistance Bed Mobility: Supine to Sit, Sit to Supine     Supine to sit: Min assist Sit to supine: Min assist   General bed mobility comments: exited bed on right side with assistance for trunk to get to EOB and back to supine    Transfers Overall transfer level: Needs assistance Equipment used: Rolling walker (2 wheels) Transfers: Sit to/from Stand, Bed to chair/wheelchair/BSC Sit to Stand: Min assist           General transfer comment: min assist to stand from EOB, patient ambualted to bathroom and back with min assist to return to EOB with cues for hand placement and safety     Balance Overall balance assessment: Needs  assistance Sitting-balance support: Single extremity supported, No upper extremity supported, Feet supported Sitting balance-Leahy Scale: Poor Sitting balance - Comments: supervision to CGA for sitting on EOB while performing grooming tasks   Standing balance support: Single extremity supported, During functional activity, Bilateral upper extremity supported Standing balance-Leahy Scale: Poor Standing balance comment: dependent on external support for standing balance.                           ADL either performed or assessed with clinical judgement   ADL Overall ADL's : Needs assistance/impaired     Grooming: Wash/dry hands;Wash/dry face;Oral care;Contact guard assist;Supervision/safety;Sitting;Standing Grooming Details (indicate cue type and reason): performed oral care seated on EOB and hand and face hygiene standing at sink                 Toilet Transfer: Minimal assistance;Ambulation Toilet Transfer Details (indicate cue type and reason): stood at toilet to void           General ADL Comments: wife present entire session    Extremity/Trunk Assessment              Vision       Perception     Praxis      Cognition Arousal: Alert Behavior During Therapy: WFL for tasks assessed/performed, Flat affect Overall Cognitive Status: Impaired/Different from baseline  Exercises      Shoulder Instructions       General Comments VSS on RA    Pertinent Vitals/ Pain       Pain Assessment Pain Assessment: Faces Faces Pain Scale: Hurts little more Pain Location: headache Pain Descriptors / Indicators: Headache Pain Intervention(s): Limited activity within patient's tolerance, Monitored during session, Premedicated before session, Repositioned  Home Living                                          Prior Functioning/Environment              Frequency  Min 1X/week         Progress Toward Goals  OT Goals(current goals can now be found in the care plan section)  Progress towards OT goals: Progressing toward goals  Acute Rehab OT Goals Patient Stated Goal: go to rehab OT Goal Formulation: With patient/family Time For Goal Achievement: 01/01/24 Potential to Achieve Goals: Good ADL Goals Pt Will Perform Grooming: with set-up;sitting Pt Will Perform Upper Body Bathing: with set-up;sitting Pt Will Perform Lower Body Bathing: with min assist;sit to/from stand;with adaptive equipment Pt Will Transfer to Toilet: ambulating;with mod assist;bedside commode Additional ADL Goal #1: pt will demonstrate gaze stabilization with visual attention for 10 seconds  Plan      Co-evaluation                 AM-PAC OT 6 Clicks Daily Activity     Outcome Measure   Help from another person eating meals?: A Little Help from another person taking care of personal grooming?: A Little Help from another person toileting, which includes using toliet, bedpan, or urinal?: A Little Help from another person bathing (including washing, rinsing, drying)?: A Lot Help from another person to put on and taking off regular upper body clothing?: A Lot Help from another person to put on and taking off regular lower body clothing?: A Lot 6 Click Score: 15    End of Session Equipment Utilized During Treatment: Gait belt;Rolling walker (2 wheels)  OT Visit Diagnosis: Unsteadiness on feet (R26.81);Muscle weakness (generalized) (M62.81)   Activity Tolerance Patient tolerated treatment well   Patient Left in bed;with call bell/phone within reach;with family/visitor present   Nurse Communication Mobility status;Precautions        Time: 8783-8762 OT Time Calculation (min): 21 min  Charges: OT General Charges $OT Visit: 1 Visit OT Treatments $Self Care/Home Management : 8-22 mins  Dick Laine, OTA Acute Rehabilitation Services  Office [phone]   Jeb LITTIE Laine 12/20/2023, 1:31 PM

## 2023-12-20 NOTE — H&P (Signed)
 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 UDS negative. He was started on hypertonic saline and taken to OR emergently for suboccipital craniectomy with decompression of cerebellar hemorrhage by Dr. Colon.  2 D echo revealed EF 65-70% with grade 1 DD and no wall abnormality. Dr. Jerri felt that hemorrhage likely hypertensive in nature and follow up MRI brain showed residual hematoma with small volume SAH, no evidence of tumor or lesion,  improvement in basilar cisterns and 4th ventricle patency.  He tolerated extubation without difficulty on 01/07 and mentation slowly improving. He was started on thins and has been advanced to regular textures. Hypertonic saline tapered off and T max of 101.3 felt to be reactive and has resolved. He continues on Keppra  for seizure prophylaxis. Dr.Xu recommends cerebral angiogram once ICH resolves to rule out AVM.    Headaches have been persistent and occasionally get worse. Has also has diplopia in far left field, difficulty sleeping with some hallucinations at night, dizziness with walking and onset of intermittent hiccups today.  He continues to be limited by unsteady gait, fatigue, higher level cognitive deficits as well as headaches.  PT/OT has been working with patient who requires min to mod assist overall. CIR recommended due to functional decline.      Review of Systems  Constitutional:  Negative for fever.  HENT:   Negative for hearing loss and tinnitus.   Eyes:  Positive for double vision.  Respiratory:  Negative for cough and shortness of breath.   Cardiovascular:  Negative for chest pain and palpitations.  Gastrointestinal:  Positive for constipation (no BM since admission.). Negative for heartburn and nausea.  Genitourinary:  Negative for dysuria.  Musculoskeletal:  Negative for back pain, joint pain and myalgias.  Neurological:  Positive for dizziness, weakness and headaches.  Psychiatric/Behavioral:  The patient has insomnia.           Past Medical History:  Diagnosis Date   Atopic dermatitis in adult     Left cataract     Obesity (BMI 30-39.9)     Undescended left testicle      had surgical intervention               Past Surgical History:  Procedure Laterality Date   CATARACT EXTRACTION W/ INTRAOCULAR LENS IMPLANT Left 2017   CHOLECYSTECTOMY       ORCHIOPEXY   1984   SUBOCCIPITAL CRANIECTOMY CERVICAL LAMINECTOMY N/A 12/16/2023    Procedure: SUBOCCIPITAL CRANIECTOMY;  Surgeon: Colon Shove, MD;  Location: [facility] OR;  Service: Neurosurgery;  Laterality: N/A;               Family History  Problem Relation Age of Onset   Heart Problems Mother     Cancer Father     Cancer Maternal Grandfather            Social History:  Married. 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 He reports that he has never smoked. He has never used smokeless tobacco. He reports that he does not drink alcohol  and does not use drugs.     Allergies       Allergies  Allergen Reactions   Apple Other (See Comments) and Nausea And Vomiting      RED ONLY   Corn-Containing Products Other (See Comments) and Nausea And Vomiting   Penicillins Hives and Swelling              Medications Prior to Admission  Medication Sig Dispense Refill   acetaminophen  (TYLENOL ) 500 MG tablet Take 500-1,000 mg by mouth every 6 (six) hours as needed for moderate pain (pain score 4-6).        DM-Doxylamine-Acetaminophen  (NYQUIL COLD & FLU PO) Take 1 Dose by mouth every 6 (six) hours as needed (Cold/cough sx).       ibuprofen  (ADVIL ) 200 MG tablet Take 400 mg by mouth every 6 (six) hours as needed for moderate pain (pain score 4-6).       triamcinolone  (KENALOG ) 0.025 % ointment Apply 1 Application topically 2 (two) times daily as needed (Dermatitis).                Home: Home Living Family/patient expects to be discharged to:: Private residence Living Arrangements: Spouse/significant other Available Help at Discharge: Family, Available 24 hours/day Type of Home: House Home Access: Stairs to enter Secretary/administrator of Steps: 4 Entrance Stairs-Rails: Can reach both Home Layout: Two level Alternate Level Stairs-Number of Steps: full flight Bathroom Shower/Tub: Tub/shower unit (likes tub) Bathroom Toilet: Standard Home Equipment: None Additional Comments: works emergency planning/management officer, practicing therapist, 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  Lives With: Spouse   Functional History: Prior Function Prior Level of Function : Independent/Modified Independent   Functional Status:  Mobility: Bed Mobility Overal bed mobility: Needs Assistance Bed Mobility: Supine to Sit, Sit to Supine Supine to sit: Min assist Sit to supine: Min assist General bed mobility comments: exited bed on right side with assistance for trunk to get to EOB and back to supine Transfers Overall transfer level: Needs assistance Equipment used: Rolling walker (2 wheels) Transfers: Sit to/from Stand, Bed to chair/wheelchair/BSC Sit to Stand: Min assist General transfer comment: min assist to stand from EOB, patient ambualted to bathroom and back with min assist to return to EOB with cues for hand placement and safety Ambulation/Gait Ambulation/Gait assistance: Mod assist Gait Distance (Feet): 300 Feet (with 3 standing or correction breaks) Assistive device: 1 person hand held assist Gait  Pattern/deviations: Step-through pattern General Gait Details: .gait unsteady overall with less wide BOS, drifting L and R with moderate lateral perturbations  pt began heading for the rails midway through the gait cycle.  Worsening with fatigue.  Overall improved, but still a difficult assist. Gait velocity interpretation: <1.8 ft/sec, indicate of risk for recurrent falls   ADL: ADL Overall ADL's : Needs assistance/impaired Eating/Feeding: Minimal assistance Grooming: Wash/dry hands, Wash/dry face, Oral care, Contact guard assist, Supervision/safety, Sitting, Standing Grooming Details (indicate cue type and reason): performed oral care seated on EOB and hand and face hygiene standing at sink Lower Body Dressing: Maximal assistance Lower Body Dressing Details (indicate cue type and reason): pt (A) with hand over hand to get sock in hands. pt with total (A) to place R LE in figure 4 cross. pt reaching with L UE to pull sock over heel that was backward chained for the patient  Toilet Transfer: Minimal assistance, Event Organiser Details (indicate cue type and reason): stood at toilet to void Toileting- Clothing Manipulation and Hygiene: Maximal assistance Functional mobility during ADLs: +2 for physical assistance, +2 for safety/equipment, Moderate assistance, Cueing for sequencing, Cueing for safety, Rolling walker (2 wheels) General ADL Comments: wife present entire session   Cognition: Cognition Overall Cognitive Status: Impaired/Different from baseline Arousal/Alertness: Awake/alert Orientation Level: Oriented X4 Attention: Sustained Sustained Attention: Impaired Sustained Attention Impairment: Verbal basic, Functional basic Memory: Impaired Memory Impairment: Retrieval deficit Awareness: Impaired Awareness Impairment: Emergent impairment Problem Solving: Impaired Problem Solving Impairment: Verbal basic Cognition Arousal: Alert Behavior During Therapy: WFL for tasks  assessed/performed, Flat affect Overall Cognitive Status: Impaired/Different from baseline General Comments: increased time to respond , clue for sustain attention     Blood pressure 132/84, pulse 69, temperature 99.5 F (37.5 C), temperature source Oral, resp. rate (!) 25, height 5' 8 (1.727 m), weight 113.6 kg, SpO2 98%. Physical Exam Vitals and nursing note reviewed.  Constitutional:      Appearance: Normal appearance. He is obese.     Comments: Lying in dark room due to HA/light sensitivity. Hiccups that resolved with conversation.   HENT:     Head:     Comments: Occiput incision C/D/I with skin glue in place. No signs of erythema.     Right Ear: External ear normal.     Left Ear: External ear normal.     Mouth/Throat:     Mouth: Mucous membranes are moist.  Eyes:     Extraocular Movements: Extraocular movements intact.     Pupils: Pupils are equal, round, and reactive to light.  Cardiovascular:     Rate and Rhythm: Normal rate and regular rhythm.     Heart sounds: No murmur heard.    No gallop.  Pulmonary:     Effort: Pulmonary effort is normal. No respiratory distress.     Breath sounds: No wheezing.  Abdominal:     General: Bowel sounds are normal. There is no distension.     Palpations: Abdomen is soft.  Musculoskeletal:        General: No swelling or tenderness.     Cervical back: Normal range of motion and neck supple.  Skin:    General: Skin is warm and dry.     Comments: Hx of dermatitis worse on back--Multiple dry lesions noted down the spine and upper buttocks. Dry flaky skin elbow and multiple old lesions on BUE/BLE.  Skin is warm to touch.  Neurological:     Mental Status: He is alert.     Comments: Alert and oriented x 3. Normal insight and awareness. Intact Memory. Normal language and speech. Cranial nerve exam unremarkable. Experiences diplopia to all fields, 2-3 beats of nystagmus with gaze left. MMT: RUE and RLE 5/5. LUE and LLE 4 to 4+/5. Sensory exam  normal for light touch and pain in all 4 limbs. Mild left limb ataxia. Did not test gait. No abnormal tone appreciated.  SABRA     Psychiatric:        Mood and Affect: Mood normal.        Behavior: Behavior normal.        Lab Results Last 48 Hours        Results for orders placed or performed during the hospital encounter of 12/16/23 (from the past 48 hours)  Glucose, capillary     Status: None    Collection Time: 12/18/23  3:33 PM  Result Value Ref  Range    Glucose-Capillary 95 70 - 99 mg/dL      Comment: Glucose reference range applies only to samples taken after fasting for at least 8 hours.  Glucose, capillary     Status: Abnormal    Collection Time: 12/18/23  7:31 PM  Result Value Ref Range    Glucose-Capillary 142 (H) 70 - 99 mg/dL      Comment: Glucose reference range applies only to samples taken after fasting for at least 8 hours.  Glucose, capillary     Status: None    Collection Time: 12/18/23 11:54 PM  Result Value Ref Range    Glucose-Capillary 88 70 - 99 mg/dL      Comment: Glucose reference range applies only to samples taken after fasting for at least 8 hours.  CBC     Status: Abnormal    Collection Time: 12/19/23  2:45 AM  Result Value Ref Range    WBC 9.4 4.0 - 10.5 K/uL    RBC 4.41 4.22 - 5.81 MIL/uL    Hemoglobin 12.0 (L) 13.0 - 17.0 g/dL    HCT 62.3 (L) 60.9 - 52.0 %    MCV 85.3 80.0 - 100.0 fL    MCH 27.2 26.0 - 34.0 pg    MCHC 31.9 30.0 - 36.0 g/dL    RDW 86.9 88.4 - 84.4 %    Platelets 207 150 - 400 K/uL    nRBC 0.0 0.0 - 0.2 %      Comment: Performed at [facility], [address] [place], [place] [address]  Basic metabolic panel     Status: Abnormal    Collection Time: 12/19/23  2:45 AM  Result Value Ref Range    Sodium 138 135 - 145 mmol/L    Potassium 3.5 3.5 - 5.1 mmol/L    Chloride 108 98 - 111 mmol/L    CO2 25 22 - 32 mmol/L    Glucose, Bld 163 (H) 70 - 99 mg/dL      Comment: Glucose reference range applies only to samples taken after  fasting for at least 8 hours.    BUN 7 6 - 20 mg/dL    Creatinine, Ser 8.98 0.61 - 1.24 mg/dL    Calcium  8.8 (L) 8.9 - 10.3 mg/dL    GFR, Estimated >39 >39 mL/min      Comment: (NOTE) Calculated using the CKD-EPI Creatinine Equation (2021)      Anion gap 5 5 - 15      Comment: Performed at [facility], [address], [place], [place] [address]  Glucose, capillary     Status: Abnormal    Collection Time: 12/19/23  3:46 AM  Result Value Ref Range    Glucose-Capillary 148 (H) 70 - 99 mg/dL      Comment: Glucose reference range applies only to samples taken after fasting for at least 8 hours.  Glucose, capillary     Status: Abnormal    Collection Time: 12/19/23  7:21 AM  Result Value Ref Range    Glucose-Capillary 109 (H) 70 - 99 mg/dL      Comment: Glucose reference range applies only to samples taken after fasting for at least 8 hours.  Sodium     Status: None    Collection Time: 12/19/23  8:40 AM  Result Value Ref Range    Sodium 141 135 - 145 mmol/L      Comment: Performed at [facility], [address], [place],  [place] [address]  Glucose, capillary     Status: None    Collection Time: 12/19/23 10:57 AM  Result Value Ref Range    Glucose-Capillary 93 70 - 99 mg/dL      Comment: Glucose reference range applies only to samples taken after fasting for at least 8 hours.  Glucose, capillary     Status: None    Collection Time: 12/19/23  3:32 PM  Result Value Ref Range    Glucose-Capillary 95 70 - 99 mg/dL      Comment: Glucose reference range applies only to samples taken after fasting for at least 8 hours.  Basic metabolic panel     Status: Abnormal    Collection Time: 12/19/23  4:53 PM  Result Value Ref Range    Sodium 137 135 - 145 mmol/L    Potassium 3.7 3.5 - 5.1 mmol/L    Chloride 104 98 - 111 mmol/L    CO2 25 22 - 32 mmol/L    Glucose, Bld 130 (H) 70 - 99 mg/dL      Comment: Glucose reference range applies only to samples taken after fasting for at least 8  hours.    BUN 9 6 - 20 mg/dL    Creatinine, Ser 9.11 0.61 - 1.24 mg/dL    Calcium  9.5 8.9 - 10.3 mg/dL    GFR, Estimated >39 >39 mL/min      Comment: (NOTE) Calculated using the CKD-EPI Creatinine Equation (2021)      Anion gap 8 5 - 15      Comment: Performed at [facility], [address] [place], [place] [address]  Glucose, capillary     Status: Abnormal    Collection Time: 12/19/23  7:43 PM  Result Value Ref Range    Glucose-Capillary 113 (H) 70 - 99 mg/dL      Comment: Glucose reference range applies only to samples taken after fasting for at least 8 hours.  Glucose, capillary     Status: Abnormal    Collection Time: 12/19/23 11:44 PM  Result Value Ref Range    Glucose-Capillary 105 (H) 70 - 99 mg/dL      Comment: Glucose reference range applies only to samples taken after fasting for at least 8 hours.  Glucose, capillary     Status: Abnormal    Collection Time: 12/20/23  3:33 AM  Result Value Ref Range    Glucose-Capillary 114 (H) 70 - 99 mg/dL      Comment: Glucose reference range applies only to samples taken after fasting for at least 8 hours.  Basic metabolic panel     Status: Abnormal    Collection Time: 12/20/23  5:22 AM  Result Value Ref Range    Sodium 136 135 - 145 mmol/L    Potassium 3.9 3.5 - 5.1 mmol/L    Chloride 103 98 - 111 mmol/L    CO2 23 22 - 32 mmol/L    Glucose, Bld 122 (H) 70 - 99 mg/dL      Comment: Glucose reference range applies only to samples taken after fasting for at least 8 hours.    BUN 21 (H) 6 - 20 mg/dL    Creatinine, Ser 8.92 0.61 - 1.24 mg/dL    Calcium  9.4 8.9 - 10.3 mg/dL    GFR, Estimated >39 >39 mL/min      Comment: (NOTE) Calculated using the CKD-EPI Creatinine Equation (2021)      Anion gap 10 5 - 15  Comment: Performed at [facility], [address] [place], [place] [address]  Glucose, capillary     Status: Abnormal    Collection Time: 12/20/23  7:10 AM  Result Value Ref Range    Glucose-Capillary 103  (H) 70 - 99 mg/dL      Comment: Glucose reference range applies only to samples taken after fasting for at least 8 hours.  Glucose, capillary     Status: Abnormal    Collection Time: 12/20/23 12:00 PM  Result Value Ref Range    Glucose-Capillary 104 (H) 70 - 99 mg/dL      Comment: Glucose reference range applies only to samples taken after fasting for at least 8 hours.      Imaging Results (Last 48 hours)  No results found.         Blood pressure 132/84, pulse 69, temperature 99.5 F (37.5 C), temperature source Oral, resp. rate (!) 25, height 5' 8 (1.727 m), weight 113.6 kg, SpO2 98%.   Medical Problem List and Plan: 1. 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 2.  Antithrombotics: -DVT/anticoagulation:  Pharmaceutical: Heparin  added on 12/20/23             -antiplatelet therapy: N/A 3. Pain Management: tylenol  prn.               --see below 4. Mood/Behavior/Sleep: LCSW to follow for evaluation and support.              --Gabapentin  300 mg/hs to assist with sleep/HA and hypnagogic hallucinations             -antipsychotic agents: N/A 5. Neuropsych/cognition: This patient is capable of making decisions on his own behalf. 6. Skin/Wound Care: Monitor incision for healing.  7. Fluids/Electrolytes/Nutrition: Monitor I/O. Check CMET in am 8. HTN: BP has been well controlled PTA--will monitor BP TID --hydralazine prn added. 9. Persistent HA: related to bleed/post-op --Add Topamax  25 mg bid as well as Gabapentin  100 mg bid/300 mg/hs as above -add tramadol  for breakthrough headache. D/c fioricet .  .  10. Vestibular sx: - Has had dizziness and diplopia--meclizine  prn added --has dark glasses to help with light sensitivity.  -recover will be more about acclimation and tolerance 11. Constipation: Increase Senna to 2 tabs bid. Continue Miralax   daily.  12. Hypersomnia hx: Was in process of getting work up for OSA.   13. ABLA: recheck CBC in am.  14. Low grade temp:             -wound looks clean, lungs are clear             -encourage IS             -monitor clinically for now     Sharlet GORMAN Schmitz, PA-C 12/20/2023

## 2023-12-20 NOTE — H&P (Signed)
 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 UDS negative. He was started on hypertonic saline and taken to OR emergently for suboccipital craniectomy with decompression of cerebellar hemorrhage by Dr. Colon.  2 D echo revealed EF 65-70% with grade 1 DD and no wall abnormality. Dr. Jerri felt that hemorrhage likely hypertensive in nature and follow up MRI brain showed residual hematoma with small volume SAH, no evidence of tumor or lesion,  improvement in basilar cisterns and 4th ventricle patency.  He tolerated extubation without difficulty on 01/07 and mentation slowly improving. He was started on thins and has been advanced to regular textures. Hypertonic saline tapered off and T max of 101.3 felt to be reactive and has resolved. He continues on Keppra  for seizure prophylaxis. Dr.Xu recommends cerebral angiogram once ICH resolves to rule out AVM.   Headaches have been persistent and occasionally get worse. Has also has diplopia in far left field, difficulty sleeping with some hallucinations at night, dizziness with walking and onset of intermittent hiccups today.  He continues to be limited by unsteady gait, fatigue, higher level cognitive deficits as well as headaches.  PT/OT has been working with patient who requires min to mod assist overall. CIR recommended due to functional decline.    Review of Systems  Constitutional:  Negative for fever.  HENT:  Negative for  hearing loss and tinnitus.   Eyes:  Positive for double vision.  Respiratory:  Negative for cough and shortness of breath.   Cardiovascular:  Negative for chest pain and palpitations.  Gastrointestinal:  Positive for constipation (no BM since admission.). Negative for heartburn and nausea.  Genitourinary:  Negative for dysuria.  Musculoskeletal:  Negative for back pain, joint pain and myalgias.  Neurological:  Positive for dizziness, weakness and headaches.  Psychiatric/Behavioral:  The patient has insomnia.     Past Medical History:  Diagnosis Date   Atopic dermatitis in adult    Left cataract    Obesity (BMI 30-39.9)    Undescended left testicle    had surgical intervention    Past Surgical History:  Procedure Laterality Date   CATARACT EXTRACTION W/ INTRAOCULAR LENS IMPLANT Left 2017   CHOLECYSTECTOMY     ORCHIOPEXY  1984   SUBOCCIPITAL CRANIECTOMY CERVICAL LAMINECTOMY N/A 12/16/2023   Procedure: SUBOCCIPITAL CRANIECTOMY;  Surgeon: Colon Shove, MD;  Location: [facility] OR;  Service: Neurosurgery;  Laterality: N/A;    Family History  Problem Relation Age of Onset   Heart Problems Mother    Cancer Father    Cancer Maternal Grandfather     Social History:  Married. 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 He reports that he has never smoked. He has never used smokeless tobacco. He reports that he does not drink alcohol  and does not use drugs.   Allergies  Allergen Reactions   Apple Other (See Comments) and Nausea And Vomiting    RED ONLY  Corn-Containing Products Other (See Comments) and Nausea And Vomiting   Penicillins Hives and Swelling    Medications Prior to Admission  Medication Sig Dispense Refill   acetaminophen  (TYLENOL ) 500 MG tablet Take 500-1,000 mg by mouth every 6 (six) hours as needed for moderate pain (pain score 4-6).     DM-Doxylamine-Acetaminophen  (NYQUIL COLD & FLU PO) Take 1 Dose by mouth every 6 (six) hours as needed  (Cold/cough sx).     ibuprofen  (ADVIL ) 200 MG tablet Take 400 mg by mouth every 6 (six) hours as needed for moderate pain (pain score 4-6).     triamcinolone  (KENALOG ) 0.025 % ointment Apply 1 Application topically 2 (two) times daily as needed (Dermatitis).       Home: Home Living Family/patient expects to be discharged to:: Private residence Living Arrangements: Spouse/significant other Available Help at Discharge: Family, Available 24 hours/day Type of Home: House Home Access: Stairs to enter Secretary/administrator of Steps: 4 Entrance Stairs-Rails: Can reach both Home Layout: Two level Alternate Level Stairs-Number of Steps: full flight Bathroom Shower/Tub: Tub/shower unit (likes tub) Bathroom Toilet: Standard Home Equipment: None Additional Comments: works emergency planning/management officer, practicing therapist, 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  Lives With: Spouse   Functional History: Prior Function Prior Level of Function : Independent/Modified Independent  Functional Status:  Mobility: Bed Mobility Overal bed mobility: Needs Assistance Bed Mobility: Supine to Sit, Sit to Supine Supine to sit: Min assist Sit to supine: Min assist General bed mobility comments: exited bed on right side with assistance for trunk to get to EOB and back to supine Transfers Overall transfer level: Needs assistance Equipment used: Rolling walker (2 wheels) Transfers: Sit to/from Stand, Bed to chair/wheelchair/BSC Sit to Stand: Min assist General transfer comment: min assist to stand from EOB, patient ambualted to bathroom and back with min assist to return to EOB with cues for hand placement and safety Ambulation/Gait Ambulation/Gait assistance: Mod assist Gait Distance (Feet): 300 Feet (with 3 standing or correction breaks) Assistive device: 1 person hand held assist Gait Pattern/deviations: Step-through pattern General Gait Details: .gait unsteady overall with less wide BOS, drifting L  and R with moderate lateral perturbations  pt began heading for the rails midway through the gait cycle.  Worsening with fatigue.  Overall improved, but still a difficult assist. Gait velocity interpretation: <1.8 ft/sec, indicate of risk for recurrent falls    ADL: ADL Overall ADL's : Needs assistance/impaired Eating/Feeding: Minimal assistance Grooming: Wash/dry hands, Wash/dry face, Oral care, Contact guard assist, Supervision/safety, Sitting, Standing Grooming Details (indicate cue type and reason): performed oral care seated on EOB and hand and face hygiene standing at sink Lower Body Dressing: Maximal assistance Lower Body Dressing Details (indicate cue type and reason): pt (A) with hand over hand to get sock in hands. pt with total (A) to place R LE in figure 4 cross. pt reaching with L UE to pull sock over heel that was backward chained for the patient Toilet Transfer: Minimal assistance, Ambulation Toilet Transfer Details (indicate cue type and reason): stood at toilet to void Toileting- Clothing Manipulation and Hygiene: Maximal assistance Functional mobility during ADLs: +2 for physical assistance, +2 for safety/equipment, Moderate assistance, Cueing for sequencing, Cueing for safety, Rolling walker (2 wheels) General ADL Comments: wife present entire session  Cognition: Cognition Overall Cognitive Status: Impaired/Different from baseline Arousal/Alertness: Awake/alert Orientation Level: Oriented X4 Attention: Sustained Sustained Attention: Impaired Sustained Attention Impairment: Verbal basic, Functional basic Memory: Impaired Memory Impairment: Retrieval deficit Awareness:  Impaired Awareness Impairment: Emergent impairment Problem Solving: Impaired Problem Solving Impairment: Verbal basic Cognition Arousal: Alert Behavior During Therapy: WFL for tasks assessed/performed, Flat affect Overall Cognitive Status: Impaired/Different from baseline General Comments: increased  time to respond , clue for sustain attention   Blood pressure 132/84, pulse 69, temperature 99.5 F (37.5 C), temperature source Oral, resp. rate (!) 25, height 5' 8 (1.727 m), weight 113.6 kg, SpO2 98%. Physical Exam Vitals and nursing note reviewed.  Constitutional:      Appearance: Normal appearance. He is obese.     Comments: Lying in dark room due to HA/light sensitivity. Hiccups that resolved with conversation.   HENT:     Head:     Comments: Occiput incision C/D/I with skin glue in place. No signs of erythema.     Right Ear: External ear normal.     Left Ear: External ear normal.     Mouth/Throat:     Mouth: Mucous membranes are moist.  Eyes:     Extraocular Movements: Extraocular movements intact.     Pupils: Pupils are equal, round, and reactive to light.  Cardiovascular:     Rate and Rhythm: Normal rate and regular rhythm.     Heart sounds: No murmur heard.    No gallop.  Pulmonary:     Effort: Pulmonary effort is normal. No respiratory distress.     Breath sounds: No wheezing.  Abdominal:     General: Bowel sounds are normal. There is no distension.     Palpations: Abdomen is soft.  Musculoskeletal:        General: No swelling or tenderness.     Cervical back: Normal range of motion and neck supple.  Skin:    General: Skin is warm and dry.     Comments: Hx of dermatitis worse on back--Multiple dry lesions noted down the spine and upper buttocks. Dry flaky skin elbow and multiple old lesions on BUE/BLE.  Skin is warm to touch.  Neurological:     Mental Status: He is alert.     Comments: Alert and oriented x 3. Normal insight and awareness. Intact Memory. Normal language and speech. Cranial nerve exam unremarkable. Experiences diplopia to all fields, 2-3 beats of nystagmus with gaze left. MMT: RUE and RLE 5/5. LUE and LLE 4 to 4+/5. Sensory exam normal for light touch and pain in all 4 limbs. Mild left limb ataxia. Did not test gait. No abnormal tone appreciated.  SABRA      Psychiatric:        Mood and Affect: Mood normal.        Behavior: Behavior normal.     Results for orders placed or performed during the hospital encounter of 12/16/23 (from the past 48 hours)  Glucose, capillary     Status: None   Collection Time: 12/18/23  3:33 PM  Result Value Ref Range   Glucose-Capillary 95 70 - 99 mg/dL    Comment: Glucose reference range applies only to samples taken after fasting for at least 8 hours.  Glucose, capillary     Status: Abnormal   Collection Time: 12/18/23  7:31 PM  Result Value Ref Range   Glucose-Capillary 142 (H) 70 - 99 mg/dL    Comment: Glucose reference range applies only to samples taken after fasting for at least 8 hours.  Glucose, capillary     Status: None   Collection Time: 12/18/23 11:54 PM  Result Value Ref Range   Glucose-Capillary 88 70 - 99 mg/dL  Comment: Glucose reference range applies only to samples taken after fasting for at least 8 hours.  CBC     Status: Abnormal   Collection Time: 12/19/23  2:45 AM  Result Value Ref Range   WBC 9.4 4.0 - 10.5 K/uL   RBC 4.41 4.22 - 5.81 MIL/uL   Hemoglobin 12.0 (L) 13.0 - 17.0 g/dL   HCT 62.3 (L) 60.9 - 47.9 %   MCV 85.3 80.0 - 100.0 fL   MCH 27.2 26.0 - 34.0 pg   MCHC 31.9 30.0 - 36.0 g/dL   RDW 86.9 88.4 - 84.4 %   Platelets 207 150 - 400 K/uL   nRBC 0.0 0.0 - 0.2 %    Comment: Performed at [facility], [address] [place], [place] [address]  Basic metabolic panel     Status: Abnormal   Collection Time: 12/19/23  2:45 AM  Result Value Ref Range   Sodium 138 135 - 145 mmol/L   Potassium 3.5 3.5 - 5.1 mmol/L   Chloride 108 98 - 111 mmol/L   CO2 25 22 - 32 mmol/L   Glucose, Bld 163 (H) 70 - 99 mg/dL    Comment: Glucose reference range applies only to samples taken after fasting for at least 8 hours.   BUN 7 6 - 20 mg/dL   Creatinine, Ser 8.98 0.61 - 1.24 mg/dL   Calcium  8.8 (L) 8.9 - 10.3 mg/dL   GFR, Estimated >39 >39 mL/min    Comment: (NOTE) Calculated  using the CKD-EPI Creatinine Equation (2021)    Anion gap 5 5 - 15    Comment: Performed at [facility], [address] [place], [place] [address]  Glucose, capillary     Status: Abnormal   Collection Time: 12/19/23  3:46 AM  Result Value Ref Range   Glucose-Capillary 148 (H) 70 - 99 mg/dL    Comment: Glucose reference range applies only to samples taken after fasting for at least 8 hours.  Glucose, capillary     Status: Abnormal   Collection Time: 12/19/23  7:21 AM  Result Value Ref Range   Glucose-Capillary 109 (H) 70 - 99 mg/dL    Comment: Glucose reference range applies only to samples taken after fasting for at least 8 hours.  Sodium     Status: None   Collection Time: 12/19/23  8:40 AM  Result Value Ref Range   Sodium 141 135 - 145 mmol/L    Comment: Performed at [facility], [address], [place], [place] [address]  Glucose, capillary     Status: None   Collection Time: 12/19/23 10:57 AM  Result Value Ref Range   Glucose-Capillary 93 70 - 99 mg/dL    Comment: Glucose reference range applies only to samples taken after fasting for at least 8 hours.  Glucose, capillary     Status: None   Collection Time: 12/19/23  3:32 PM  Result Value Ref Range   Glucose-Capillary 95 70 - 99 mg/dL    Comment: Glucose reference range applies only to samples taken after fasting for at least 8 hours.  Basic metabolic panel     Status: Abnormal   Collection Time: 12/19/23  4:53 PM  Result Value Ref Range   Sodium 137 135 - 145 mmol/L   Potassium 3.7 3.5 - 5.1 mmol/L   Chloride 104 98 - 111 mmol/L   CO2 25 22 - 32 mmol/L   Glucose, Bld 130 (H) 70 - 99 mg/dL  Comment: Glucose reference range applies only to samples taken after fasting for at least 8 hours.   BUN 9 6 - 20 mg/dL   Creatinine, Ser 9.11 0.61 - 1.24 mg/dL   Calcium  9.5 8.9 - 10.3 mg/dL   GFR, Estimated >39 >39 mL/min    Comment: (NOTE) Calculated using the CKD-EPI Creatinine Equation (2021)    Anion gap 8 5  - 15    Comment: Performed at [facility], [address], [place], [place] [address]  Glucose, capillary     Status: Abnormal   Collection Time: 12/19/23  7:43 PM  Result Value Ref Range   Glucose-Capillary 113 (H) 70 - 99 mg/dL    Comment: Glucose reference range applies only to samples taken after fasting for at least 8 hours.  Glucose, capillary     Status: Abnormal   Collection Time: 12/19/23 11:44 PM  Result Value Ref Range   Glucose-Capillary 105 (H) 70 - 99 mg/dL    Comment: Glucose reference range applies only to samples taken after fasting for at least 8 hours.  Glucose, capillary     Status: Abnormal   Collection Time: 12/20/23  3:33 AM  Result Value Ref Range   Glucose-Capillary 114 (H) 70 - 99 mg/dL    Comment: Glucose reference range applies only to samples taken after fasting for at least 8 hours.  Basic metabolic panel     Status: Abnormal   Collection Time: 12/20/23  5:22 AM  Result Value Ref Range   Sodium 136 135 - 145 mmol/L   Potassium 3.9 3.5 - 5.1 mmol/L   Chloride 103 98 - 111 mmol/L   CO2 23 22 - 32 mmol/L   Glucose, Bld 122 (H) 70 - 99 mg/dL    Comment: Glucose reference range applies only to samples taken after fasting for at least 8 hours.   BUN 21 (H) 6 - 20 mg/dL   Creatinine, Ser 8.92 0.61 - 1.24 mg/dL   Calcium  9.4 8.9 - 10.3 mg/dL   GFR, Estimated >39 >39 mL/min    Comment: (NOTE) Calculated using the CKD-EPI Creatinine Equation (2021)    Anion gap 10 5 - 15    Comment: Performed at [facility], [address], [place], [place] [address]  Glucose, capillary     Status: Abnormal   Collection Time: 12/20/23  7:10 AM  Result Value Ref Range   Glucose-Capillary 103 (H) 70 - 99 mg/dL    Comment: Glucose reference range applies only to samples taken after fasting for at least 8 hours.  Glucose, capillary     Status: Abnormal   Collection Time: 12/20/23 12:00 PM  Result Value Ref Range   Glucose-Capillary 104 (H) 70 - 99 mg/dL     Comment: Glucose reference range applies only to samples taken after fasting for at least 8 hours.   No results found.    Blood pressure 132/84, pulse 69, temperature 99.5 F (37.5 C), temperature source Oral, resp. rate (!) 25, height 5' 8 (1.727 m), weight 113.6 kg, SpO2 98%.  Medical Problem List and Plan: 1. Functional deficits secondary to left cerebellar hemorrhage s/p craniotomy and evacuation  -patient may shower  -ELOS/Goals: 5-8 days, mod I goals with PT and OT. 2.  Antithrombotics: -DVT/anticoagulation:  Pharmaceutical: Heparin  added on 12/20/23  -antiplatelet therapy: N/A 3. Pain Management: tylenol  prn.    --d/c Fioricet  4. Mood/Behavior/Sleep: LCSW to follow for evaluation and support.   --Gabapentin  300 mg/hs to assist with  sleep/HA and hypnagogic hallucinations  -antipsychotic agents: N/A 5. Neuropsych/cognition: This patient is capable of making decisions on his own behalf. 6. Skin/Wound Care: Monitor incision for healing.  7. Fluids/Electrolytes/Nutrition: Monitor I/O. Check CMET in am 8. HTN: BP has been well controlled PTA--will monitor BP TID --hydralazine prn added. 9. Persistent HA: d/c Fioricet  to avoid refractory HA --Add Topamax  25 mg bid as well as Gabapentin  100 mg bid/300 mg/hs as above -may need something for breakthrough sx beyond tylenol  however.  10. Vestibular sx: - Has had dizziness and diplopia--meclizine  prn added --has dark glasses to help with light sensitivity.  -recover will be more about acclimation and tolerance 11. Constipation: Increase Senna to 2 tabs bid. Continue Miralax  daily.  12. Hypersomnia hx: Was in process of getting work up for OSA.   13. ABLA: recheck CBC in am.  14. Low grade temp:  -wound looks clean, lungs are clear  -encourage IS  -monitor clinically for now   Sharlet GORMAN Schmitz, PA-C 12/20/2023

## 2023-12-21 DIAGNOSIS — I614 Nontraumatic intracerebral hemorrhage in cerebellum: Secondary | ICD-10-CM | POA: Diagnosis not present

## 2023-12-21 DIAGNOSIS — R519 Headache, unspecified: Secondary | ICD-10-CM

## 2023-12-21 DIAGNOSIS — G4700 Insomnia, unspecified: Secondary | ICD-10-CM

## 2023-12-21 DIAGNOSIS — K5900 Constipation, unspecified: Secondary | ICD-10-CM

## 2023-12-21 DIAGNOSIS — D62 Acute posthemorrhagic anemia: Secondary | ICD-10-CM | POA: Diagnosis not present

## 2023-12-21 DIAGNOSIS — R509 Fever, unspecified: Secondary | ICD-10-CM

## 2023-12-21 LAB — CBC WITH DIFFERENTIAL/PLATELET
Abs Immature Granulocytes: 0.05 10*3/uL (ref 0.00–0.07)
Basophils Absolute: 0 10*3/uL (ref 0.0–0.1)
Basophils Relative: 0 %
Eosinophils Absolute: 0.1 10*3/uL (ref 0.0–0.5)
Eosinophils Relative: 1 %
HCT: 43.7 % (ref 39.0–52.0)
Hemoglobin: 14.4 g/dL (ref 13.0–17.0)
Immature Granulocytes: 1 %
Lymphocytes Relative: 21 %
Lymphs Abs: 2 10*3/uL (ref 0.7–4.0)
MCH: 27.2 pg (ref 26.0–34.0)
MCHC: 33 g/dL (ref 30.0–36.0)
MCV: 82.5 fL (ref 80.0–100.0)
Monocytes Absolute: 1.1 10*3/uL — ABNORMAL HIGH (ref 0.1–1.0)
Monocytes Relative: 12 %
Neutro Abs: 6.3 10*3/uL (ref 1.7–7.7)
Neutrophils Relative %: 65 %
Platelets: 307 10*3/uL (ref 150–400)
RBC: 5.3 MIL/uL (ref 4.22–5.81)
RDW: 12.6 % (ref 11.5–15.5)
WBC: 9.6 10*3/uL (ref 4.0–10.5)
nRBC: 0 % (ref 0.0–0.2)

## 2023-12-21 LAB — COMPREHENSIVE METABOLIC PANEL
ALT: 36 U/L (ref 0–44)
AST: 26 U/L (ref 15–41)
Albumin: 3.3 g/dL — ABNORMAL LOW (ref 3.5–5.0)
Alkaline Phosphatase: 87 U/L (ref 38–126)
Anion gap: 10 (ref 5–15)
BUN: 18 mg/dL (ref 6–20)
CO2: 25 mmol/L (ref 22–32)
Calcium: 9.4 mg/dL (ref 8.9–10.3)
Chloride: 100 mmol/L (ref 98–111)
Creatinine, Ser: 1.19 mg/dL (ref 0.61–1.24)
GFR, Estimated: >60 mL/min (ref >60)
Glucose, Bld: 112 mg/dL — ABNORMAL HIGH (ref 70–99)
Potassium: 3.9 mmol/L (ref 3.5–5.1)
Sodium: 135 mmol/L (ref 135–145)
Total Bilirubin: 0.6 mg/dL (ref 0.0–1.2)
Total Protein: 7.5 g/dL (ref 6.5–8.1)

## 2023-12-21 MED ORDER — MELATONIN 5 MG PO TABS: 5.0000 mg | ORAL_TABLET | Freq: Every day | ORAL | Status: DC

## 2023-12-21 NOTE — Evaluation (Signed)
 Occupational Therapy Assessment and Plan  Patient Details  Name: Wayne Lee MRN: [id number] Date of Birth: 19-Oct-1976  OT Diagnosis: ataxia, cognitive deficits, disturbance of vision, and balance impairments Rehab Potential: Rehab Potential (ACUTE ONLY): Excellent ELOS: 10-12 days   Today's Date: 12/21/2023 OT Individual Time: 0900-1015 OT Individual Time Calculation (min): 75 min     Hospital Problem: Principal Problem:   ICH (intracerebral hemorrhage) (HCC)   Past Medical History:  Past Medical History:  Diagnosis Date   Atopic dermatitis in adult    Left cataract    Obesity (BMI 30-39.9)    Undescended left testicle    had surgical intervention   Past Surgical History:  Past Surgical History:  Procedure Laterality Date   CATARACT EXTRACTION W/ INTRAOCULAR LENS IMPLANT Left 2017   CHOLECYSTECTOMY     ORCHIOPEXY  1984   SUBOCCIPITAL CRANIECTOMY CERVICAL LAMINECTOMY N/A 12/16/2023   Procedure: SUBOCCIPITAL CRANIECTOMY;  Surgeon: Colon Shove, MD;  Location: [facility] OR;  Service: Neurosurgery;  Laterality: N/A;    Assessment & Plan Clinical Impression:  Wayne Lee is a 48 year old male with history of eczema, obesity who was admitted on 12/16/23 with sudden onset of HA, emesis X 2 and dizziness. 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 UDS negative. He was started on hypertonic saline and taken to OR emergently for suboccipital craniectomy with decompression of cerebellar hemorrhage by Dr. Colon.  2 D echo revealed EF 65-70% with grade 1 DD and no wall abnormality. Dr. Jerri felt that hemorrhage likely hypertensive in nature and follow up MRI brain showed residual hematoma with small volume SAH, no evidence of tumor or lesion,   improvement in basilar cisterns and 4th ventricle patency.  He tolerated extubation without difficulty on 01/07 and mentation slowly improving. He was started on thins and has been advanced to regular textures. Hypertonic saline tapered off and T max of 101.3 felt to be reactive and has resolved. He continues on Keppra  for seizure prophylaxis. Dr.Xu recommends cerebral angiogram once ICH resolves to rule out AVM.    Headaches have been persistent and occasionally get worse. Has also has diplopia in far left field, difficulty sleeping with some hallucinations at night, dizziness with walking and onset of intermittent hiccups today.  He continues to be limited by unsteady gait, fatigue, higher level cognitive deficits as well as headaches.  PT/OT has been working with patient who requires min to mod assist overall. CIR recommended due to functional decline. .  Patient transferred to CIR on 12/20/2023 .    Patient currently requires min with basic self-care skills secondary to decreased coordination, decreased visual perceptual skills and decreased visual motor skills, decreased attention to right, decreased awareness, decreased problem solving, and decreased memory, and decreased sitting balance, decreased standing balance, decreased postural control, and decreased balance strategies.  Prior to hospitalization, patient was fully independent, working, married and raising 2 children, cared for his dogs.  Patient will benefit from skilled intervention to increase independence with basic self-care skills prior to discharge home with care partner.  Anticipate patient will require intermittent supervision and follow up outpatient.  OT - End of Session Activity Tolerance: Tolerates 10 - 20 min activity with multiple rests Endurance Deficit: Yes Endurance Deficit Description: pt stated his fatigue  was at a 8/10 level post shower and ADL routine OT Assessment Rehab Potential (ACUTE ONLY): Excellent OT Patient  demonstrates impairments in the following area(s): Balance;Cognition;Endurance;Motor;Pain;Perception;Safety OT Basic ADL's Functional Problem(s): Bathing;Dressing;Toileting;Grooming OT Transfers Functional Problem(s): Toilet;Tub/Shower OT Additional Impairment(s): None OT Plan OT Intensity: Minimum of 1-2 x/day, 45 to 90 minutes OT Frequency: 5 out of 7 days OT Duration/Estimated Length of Stay: 10-12 days OT Treatment/Interventions: Balance/vestibular training;Cognitive remediation/compensation;Discharge planning;Community reintegration;DME/adaptive equipment instruction;Neuromuscular re-education;Functional mobility training;Functional electrical stimulation;Pain management;Patient/family education;Psychosocial support;Self Care/advanced ADL retraining;Therapeutic Exercise;Therapeutic Activities;UE/LE Strength taining/ROM;Visual/perceptual remediation/compensation;UE/LE Coordination activities OT Self Feeding Anticipated Outcome(s): independent OT Basic Self-Care Anticipated Outcome(s): set up to supervision OT Toileting Anticipated Outcome(s): set up OT Bathroom Transfers Anticipated Outcome(s): supervision OT Recommendation Patient destination: Home Follow Up Recommendations: Outpatient OT Equipment Recommended: To be determined   OT Evaluation Precautions/Restrictions  Precautions Precautions: Fall Precaution Comments: watch BP Restrictions Weight Bearing Restrictions Per Provider Order: No General   Vital Signs   Pain Pain Assessment Pain Scale: 0-10 Pain Score: 0-No pain Home Living/Prior Functioning Home Living Available Help at Discharge: Family, Available 24 hours/day Type of Home: House Home Access: Stairs to enter [employer] of Steps: 4 Entrance Stairs-Rails: Can reach both Home Layout: Two level Alternate Level Stairs-Number of Steps: full flight Bathroom Shower/Tub: Tub/shower unit Additional Comments: works emergency planning/management officer,  practicing therapist, 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  Lives With: Spouse Prior Function Level of Independence: Independent with basic ADLs, Independent with gait, Independent with transfers, Independent with homemaking with ambulation  Able to Take Stairs?: Yes Driving: Yes Vocation: Full time employment Vision Baseline Vision/History: 0 No visual deficits Ability to See in Adequate Light: 1 Impaired Patient Visual Report: Diplopia (diplopia is intermittent, no diplopia during 75 min OT evaluation this am. May arise when pt is fatigued.) Vision Assessment?: Yes Eye Alignment: Within Functional Limits Ocular Range of Motion: Within Functional Limits Alignment/Gaze Preference: Within Defined Limits Tracking/Visual Pursuits: Able to track stimulus in all quads without difficulty Visual Fields: No apparent deficits Perception  Perception: Impaired Perception-Other Comments: mild left inattention Praxis Praxis: WFL Cognition Cognition Overall Cognitive Status: Impaired/Different from baseline Arousal/Alertness: Awake/alert Orientation Level: Person;Place;Situation Person: Oriented Place: Oriented Situation: Oriented Memory: Impaired Memory Impairment: Retrieval deficit Awareness: Impaired Awareness Impairment: Anticipatory impairment Problem Solving: Impaired Problem Solving Impairment: Verbal complex;Functional complex Safety/Judgment: Appears intact Brief Interview for Mental Status (BIMS) Repetition of Three Words (First Attempt): 3 Temporal Orientation: Year: Correct Temporal Orientation: Month: Accurate within 5 days Temporal Orientation: Day: Correct Recall: Sock: Yes, no cue required Recall: Blue: Yes, no cue required Recall: Bed: No, could not recall BIMS Summary Score: 13 Sensation Sensation Light Touch: Appears Intact Hot/Cold: Appears Intact Proprioception: Impaired by gross assessment Stereognosis: Impaired by gross assessment Additional Comments: pt  frequently drops items from L hand, able to localize light touch Coordination Gross Motor Movements are Fluid and Coordinated: No Fine Motor Movements are Fluid and Coordinated: No Coordination and Movement Description: pt actively using LUE, has difficulty with [facility] Finger Nose Finger Test: [facility] RUE,  speed functional on L but has dysmetria when touching distant target Motor  Motor Motor: Hemiplegia Motor - Skilled Clinical Observations: LUE WFL, decreased control of LLE  Trunk/Postural Assessment  Cervical Assessment Cervical Assessment: Within Functional Limits Thoracic Assessment Thoracic Assessment: Within Functional Limits Lumbar Assessment Lumbar Assessment: Within Functional Limits Postural Control Postural Control: Deficits on evaluation Trunk Control: slight posterior lean in standing when reaching arms overhead  Balance Static Sitting Balance Static Sitting -  Level of Assistance: 5: Stand by assistance Dynamic Sitting Balance Dynamic Sitting - Level of Assistance: 5: Stand by assistance Static Standing Balance Static Standing - Level of Assistance: 4: Min assist (CGA) Dynamic Standing Balance Dynamic Standing - Level of Assistance: 3: Mod assist Extremity/Trunk Assessment RUE Assessment RUE Assessment: Within Functional Limits LUE Assessment LUE Assessment: Within Functional Limits  Care Tool Care Tool Self Care Eating   Eating Assist Level: Set up assist    Oral Care    Oral Care Assist Level: Set up assist    Bathing   Body parts bathed by patient: Right arm;Left arm;Chest;Abdomen;Front perineal area;Buttocks;Right upper leg;Face;Left upper leg Body parts bathed by helper: Right lower leg;Left lower leg   Assist Level: Minimal Assistance - Patient > 75%    Upper Body Dressing(including orthotics)   What is the patient wearing?: Pull over shirt   Assist Level: Supervision/Verbal cueing    Lower Body Dressing (excluding footwear)   What is the patient  wearing?: Pants Assist for lower body dressing: Contact Guard/Touching assist    Putting on/Taking off footwear   What is the patient wearing?: Non-skid slipper socks Assist for footwear: Moderate Assistance - Patient 50 - 74%       Care Tool Toileting Toileting activity   Assist for toileting: Minimal Assistance - Patient > 75%     Care Tool Bed Mobility Roll left and right activity   Roll left and right assist level: Supervision/Verbal cueing    Sit to lying activity   Sit to lying assist level: Supervision/Verbal cueing    Lying to sitting on side of bed activity   Lying to sitting on side of bed assist level: the ability to move from lying on the back to sitting on the side of the bed with no back support.: Supervision/Verbal cueing     Care Tool Transfers Sit to stand transfer   Sit to stand assist level: Minimal Assistance - Patient > 75%    Chair/bed transfer   Chair/bed transfer assist level: Minimal Assistance - Patient > 75%     Toilet transfer   Assist Level: Minimal Assistance - Patient > 75%     Care Tool Cognition  Expression of Ideas and Wants Expression of Ideas and Wants: 3. Some difficulty - exhibits some difficulty with expressing needs and ideas (e.g, some words or finishing thoughts) or speech is not clear  Understanding Verbal and Non-Verbal Content Understanding Verbal and Non-Verbal Content: 3. Usually understands - understands most conversations, but misses some part/intent of message. Requires cues at times to understand   Memory/Recall Ability Memory/Recall Ability : Current season;That he or she is in a hospital/hospital unit   Refer to Care Plan for Long Term Goals  SHORT TERM GOAL WEEK 1 OT Short Term Goal 1 (Week 1): Pt will be able to ambulate to bathroom with min A of 1 person. OT Short Term Goal 2 (Week 1): Pt will be able to step over tub wall with CGA and use of grab bars. OT Short Term Goal 3 (Week 1): pt will be able to don socks  and shoes with supervision. OT Short Term Goal 4 (Week 1): Pt will be able to recall 3 activities he did in his last therapy session to demonstrate improvement in day to day memory.  Recommendations for other services: None    Skilled Therapeutic Intervention ADL ADL Eating: Set up Grooming: Setup Upper Body Bathing: Supervision/safety Where Assessed-Upper Body Bathing: Shower Lower Body Bathing: Minimal assistance  Where Assessed-Lower Body Bathing: Shower Upper Body Dressing: Supervision/safety Where Assessed-Upper Body Dressing: Chair Lower Body Dressing: Minimal assistance Where Assessed-Lower Body Dressing: Chair Toileting: Minimal assistance Where Assessed-Toileting: Teacher, Adult Education: Curator Method: Risk Manager: Insurance Underwriter Method: Warden/ranger: Transfer tub bench;Grab bars    Pt seen for initial evaluation and ADL training with a focus on balance, endurance, and coordination.  Pt's wife present.  Pt agreeable to a shower. For first part of session, rehab tech present for +2 for safety with pt ambulating to bathroom. Pt able to ambulate with mod A of 1 person due to postural sway ant/posterior.  Pt has a fast gait pattern which he said is normal for him.  Cued him to slow down to compensate for balance impairments. Pt has excellent AROM and strength in LUE but often drops items.  He identified local touch to hand so he is demonstrating L inattention. When he focused on an item in his left hand he is able to hold onto it with more control.   Reviewed role of OT, discussed POC and pt's goals, and ELOS.  Pt's blood pressure checked 2x and was WNL.  At end of session pt stated he was fatigued.  Pt resting in bed With all needs met and bed alarm set.     Discharge Criteria: Patient will be discharged from OT if patient refuses treatment 3 consecutive times without medical  reason, if treatment goals not met, if there is a change in medical status, if patient makes no progress towards goals or if patient is discharged from hospital.  The above assessment, treatment plan, treatment alternatives and goals were discussed and mutually agreed upon: by patient and by family  Sharian Delia 12/21/2023, 1:05 PM

## 2023-12-21 NOTE — Plan of Care (Signed)
  Problem: Consults Goal: RH BRAIN INJURY PATIENT EDUCATION Description: Description: See Patient Education module for eduction specifics Outcome: Progressing   Problem: RH BOWEL ELIMINATION Goal: RH STG MANAGE BOWEL WITH ASSISTANCE Description: STG Manage Bowel with toileting Assistance. Outcome: Progressing   Problem: RH SAFETY Goal: RH STG ADHERE TO SAFETY PRECAUTIONS W/ASSISTANCE/DEVICE Description: STG Adhere to Safety Precautions With cues Assistance/Device. Outcome: Progressing   Problem: RH PAIN MANAGEMENT Goal: RH STG PAIN MANAGED AT OR BELOW PT'S PAIN GOAL Description: < 4 with prns Outcome: Progressing

## 2023-12-21 NOTE — Plan of Care (Signed)
  Problem: RH Problem Solving Goal: LTG Patient will demonstrate problem solving for (SLP) Description: LTG:  Patient will demonstrate problem solving for basic/complex daily situations with cues  (SLP) Flowsheets (Taken 12/21/2023 1748) LTG Patient will demonstrate problem solving for: Supervision   Problem: RH Memory Goal: LTG Patient will demonstrate ability for day to day (SLP) Description: LTG:   Patient will demonstrate ability for day to day recall/carryover during cognitive/linguistic activities with assist  (SLP) Flowsheets (Taken 12/21/2023 1748) LTG: Patient will demonstrate ability for day to day recall/carryover during cognitive/linguistic activities with assist (SLP): Supervision   Problem: RH Attention Goal: LTG Patient will demonstrate this level of attention during functional activites (SLP) Description: LTG:  Patient will will demonstrate this level of attention during functional activites (SLP) Flowsheets (Taken 12/21/2023 1748) LTG: Patient will demonstrate this level of attention during cognitive/linguistic activities with assistance of (SLP): Supervision

## 2023-12-21 NOTE — Evaluation (Signed)
 Speech Language Pathology Assessment and Plan  Patient Details  Name: Wayne Lee MRN: [id number] Date of Birth: 01-Jun-1976  SLP Diagnosis: Cognitive Impairments  Rehab Potential: Excellent ELOS: 10-12 days    Today's Date: 12/21/2023 SLP Individual Time: 0100-0200 SLP Individual Time Calculation (min): 60 min   Hospital Problem: Principal Problem:   ICH (intracerebral hemorrhage) (HCC)  Past Medical History:  Past Medical History:  Diagnosis Date   Atopic dermatitis in adult    Left cataract    Obesity (BMI 30-39.9)    Undescended left testicle    had surgical intervention   Past Surgical History:  Past Surgical History:  Procedure Laterality Date   CATARACT EXTRACTION W/ INTRAOCULAR LENS IMPLANT Left 2017   CHOLECYSTECTOMY     ORCHIOPEXY  1984   SUBOCCIPITAL CRANIECTOMY CERVICAL LAMINECTOMY N/A 12/16/2023   Procedure: SUBOCCIPITAL CRANIECTOMY;  Surgeon: Colon Shove, MD;  Location: [facility] OR;  Service: Neurosurgery;  Laterality: N/A;    Assessment / Plan / Recommendation Clinical Impression  Pt is a 48 year old male with history of eczema, obesity who was admitted on 12/16/23 with sudden onset of HA, emesis X 2 and dizziness. 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   A cognitive-linguistic evaluation was completed via patient and spouse interview, administration of the SLUMs, and portions of the Cognistat. Highest level of education is Master's degree. Pt works full time as a ship broker. Of note, pt had very little sleep last night and was in 7/10 pain d/t a headache during the evaluation, which may have impacted his performance. Pt and wife report his cognition is slightly different from baseline and that  his speech is slightly different but is 100% intelligible. Pt stated concern for attention to 8 hours of work if he had to return today. Wife states he is more impulsive that prior to CVA. SLUMs = 24/30 (WNL for his education level is 27/30) with deficits noted in short term memory recall. Pt recalled 5/5 words immediately, 4/5 with delayed recall of 5 minutes, and 5/5 with delayed recall of 20 minutes. 50% acc for answering questions following a short paragraph. Slightly reduced sustained attention noted during digits repetition tasks (75% acc). 100% acc for safety/judgement questions. 75% acc for simple math calculations.   Pt presents with mild cognitive-linguistic deficits in STM and attention and would benefit from ST to target higher-level cognitive tasks. Discussed with the pt that we may start by seeing him 1-3 times to see how he does and monitor if any cognitive issues arise during his work with PT/OT. Pt and wife in agreement with plan.     Skilled Therapeutic Interventions          Cognitive-linguistic evalulation completed. See above.  SLP Assessment  Patient will need skilled Speech Lanaguage Pathology Services during CIR admission    Recommendations  Oral Care Recommendations: Oral care BID Patient destination: Home Follow up Recommendations: None    SLP Frequency 1 to 3 out of 7 days   SLP Duration  SLP Intensity  SLP Treatment/Interventions 10-12 days  Minumum of 1-2 x/day, 30 to 90 minutes  Cognitive remediation/compensation;Therapeutic Activities;Functional tasks;Therapeutic Exercise    Pain Pain Assessment Pain Scale: 0-10 Pain Score: 7  Pain Type: Acute pain Pain  Location: Head  Prior Functioning Cognitive/Linguistic Baseline: Within functional limits Type of Home: House  Lives With: Spouse Available Help at Discharge: Family;Available 24 hours/day Education: Master's degree Vocation: Full time employment  SLP Evaluation Cognition Overall Cognitive  Status: Impaired/Different from baseline Arousal/Alertness: Awake/alert Orientation Level: Oriented X4 Year: 2025 Month: January Day of Week: Correct Memory: Impaired Memory Impairment: Retrieval deficit Awareness: Impaired Awareness Impairment: Anticipatory impairment Problem Solving: Impaired Safety/Judgment: Appears intact  Comprehension Auditory Comprehension Overall Auditory Comprehension: Appears within functional limits for tasks assessed Expression Expression Primary Mode of Expression: Verbal Verbal Expression Overall Verbal Expression: Appears within functional limits for tasks assessed Written Expression Dominant Hand: Right Oral Motor Oral Motor/Sensory Function Overall Oral Motor/Sensory Function: Within functional limits Motor Speech Overall Motor Speech: Appears within functional limits for tasks assessed Respiration: Within functional limits Phonation: Normal Resonance: Within functional limits Articulation: Within functional limitis Level of Impairment: Conversation Intelligibility: Intelligible Conversation: 75-100% accurate Motor Planning: Witnin functional limits  Care Tool Care Tool Cognition Ability to hear (with hearing aid or hearing appliances if normally used Ability to hear (with hearing aid or hearing appliances if normally used): 0. Adequate - no difficulty in normal conservation, social interaction, listening to TV   Expression of Ideas and Wants Expression of Ideas and Wants: 3. Some difficulty - exhibits some difficulty with expressing needs and ideas (e.g, some words or finishing thoughts) or speech is not clear   Understanding Verbal and Non-Verbal Content Understanding Verbal and Non-Verbal Content: 3. Usually understands - understands most conversations, but misses some part/intent of message. Requires cues at times to understand  Memory/Recall Ability Memory/Recall Ability : Current season;That he or she is in a hospital/hospital unit    Intelligibility: Intelligible Conversation: 75-100% accurate  Short Term Goals: Week 1: SLP Short Term Goal 1 (Week 1): Pt will complete medication management tasks with 80% accuracy when provided min verbal cues. SLP Short Term Goal 2 (Week 1): Pt will complete higher-level functional work tasks (i.e. documentation, sending emails etc) with 80% acc when provided min verbal cues. SLP Short Term Goal 3 (Week 1): Pt will recall functional pertinent information from paragraphs, articles, and/or conversation with a delayed recall with 80% acc when provided min verbal cues.  Refer to Care Plan for Long Term Goals  Recommendations for other services: None   Discharge Criteria: Patient will be discharged from SLP if patient refuses treatment 3 consecutive times without medical reason, if treatment goals not met, if there is a change in medical status, if patient makes no progress towards goals or if patient is discharged from hospital.  The above assessment, treatment plan, treatment alternatives and goals were discussed and mutually agreed upon: by patient  Waddell JONETTA Novak 12/21/2023, 5:32 PM

## 2023-12-21 NOTE — Progress Notes (Signed)
 PROGRESS NOTE   Subjective/Complaints: Reports insomnia last night.  Reports HA pain is manageable.    ROS: Patient denies fever, new vision changes, dizziness, nausea, vomiting, diarrhea,  shortness of breath or chest pain,  or mood change.  +HA + insomnia  Objective:   No results found. Recent Labs    12/19/23 0245 12/21/23 0607  WBC 9.4 9.6  HGB 12.0* 14.4  HCT 37.6* 43.7  PLT 207 307   Recent Labs    12/20/23 2138 12/21/23 0607  NA 134* 135  K 4.1 3.9  CL 100 100  CO2 24 25  GLUCOSE 119* 112*  BUN 16 18  CREATININE 1.15 1.19  CALCIUM  9.4 9.4    Intake/Output Summary (Last 24 hours) at 12/21/2023 1946 Last data filed at 12/21/2023 1820 Gross per 24 hour  Intake 456 ml  Output 600 ml  Net -144 ml        Physical Exam: Vital Signs Blood pressure 113/64, pulse 74, temperature 98.5 F (36.9 C), resp. rate 18, height 5' 8 (1.727 m), weight 110.9 kg, SpO2 97%.   General: No apparent distress, no hiccups noted this AM HEENT: Occiput incision C/D/I with skin glue in place. No signs of erythema.  MMM Neck: Supple without JVD or lymphadenopathy Heart: Reg rate and rhythm. Chest: CTA bilaterally without wheezes, rales, or rhonchi; no distress Abdomen: Soft, non-tender, non-distended, bowel sounds positive. Extremities: No clubbing, cyanosis, or edema. Pulses are 2+ Psych: Pt's affect is appropriate. Pt is cooperative Skin:  Hx of dermatitis worse on back--Multiple dry lesions noted down the spine and upper buttocks. Dry flaky skin elbow and multiple old lesions on BUE/BLE.  Skin is warm to touch.  Neuro:  Alert and awake.  Intact Memory. Normal language and speech. Cranial nerve 2-12 grossly intact.  Experiences diplopia to all fields, 2-3 beats of nystagmus with gaze left.  MMT: RUE and RLE 5/5. LUE and LLE 4 to 4+/5.  Sensory exam normal for light touch and pain in all 4 limbs.  Mild left limb ataxia.   Musculoskeletal: No joint swelling    Assessment/Plan: 1. Functional deficits which require 3+ hours per day of interdisciplinary therapy in a comprehensive inpatient rehab setting. Physiatrist is providing close team supervision and 24 hour management of active medical problems listed below. Physiatrist and rehab team continue to assess barriers to discharge/monitor patient progress toward functional and medical goals  Care Tool:  Bathing    Body parts bathed by patient: Right arm, Left arm, Chest, Abdomen, Front perineal area, Buttocks, Right upper leg, Face, Left upper leg   Body parts bathed by helper: Right lower leg, Left lower leg     Bathing assist Assist Level: Minimal Assistance - Patient > 75%     Upper Body Dressing/Undressing Upper body dressing   What is the patient wearing?: Pull over shirt    Upper body assist Assist Level: Supervision/Verbal cueing    Lower Body Dressing/Undressing Lower body dressing      What is the patient wearing?: Pants     Lower body assist Assist for lower body dressing: Contact Guard/Touching assist     Toileting Toileting    Toileting assist  Assist for toileting: Minimal Assistance - Patient > 75%     Transfers Chair/bed transfer  Transfers assist     Chair/bed transfer assist level: Minimal Assistance - Patient > 75%     Locomotion Ambulation   Ambulation assist      Assist level: Moderate Assistance - Patient 50 - 74% Assistive device: Hand held assist     Walk 10 feet activity   Assist     Assist level: Moderate Assistance - Patient - 50 - 74% Assistive device: Hand held assist   Walk 50 feet activity   Assist           Walk 150 feet activity   Assist           Walk 10 feet on uneven surface  activity   Assist           Wheelchair     Assist               Wheelchair 50 feet with 2 turns activity    Assist            Wheelchair 150 feet activity      Assist          Blood pressure 113/64, pulse 74, temperature 98.5 F (36.9 C), resp. rate 18, height 5' 8 (1.727 m), weight 110.9 kg, SpO2 97%.   Medical Problem List and Plan: 1. 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  -Continue CIR 2.  Antithrombotics: -DVT/anticoagulation:  Pharmaceutical: Heparin  added on 12/20/23             -antiplatelet therapy: N/A 3. Pain Management: tylenol  prn.               --see below 4. Mood/Behavior/Sleep: LCSW to follow for evaluation and support.              --Gabapentin  300 mg/hs to assist with sleep/HA and hypnagogic hallucinations             -antipsychotic agents: N/A  -1/11 Insomnia last night, discussed can try trazodone  PRN 5. Neuropsych/cognition: This patient is capable of making decisions on his own behalf. 6. Skin/Wound Care: Monitor incision for healing.  7. Fluids/Electrolytes/Nutrition: Monitor I/O. Check CMET in am 8. HTN: BP has been well controlled PTA--will monitor BP TID --hydralazine prn added. -1/11 BP stable, monitor     12/21/2023    4:56 PM 12/21/2023    5:50 AM 12/20/2023    7:27 PM  Vitals with BMI  Systolic 113 121 865  Diastolic 64 82 84  Pulse 74 60 72    9. Persistent HA: related to bleed/post-op --Add Topamax  25 mg bid as well as Gabapentin  100 mg bid/300 mg/hs as above -add tramadol  for breakthrough headache. D/c fioricet .  -Continue current regimen today .  10. Vestibular sx: - Has had dizziness and diplopia--meclizine  prn added --has dark glasses to help with light sensitivity.  -recover will be more about acclimation and tolerance 11. Constipation: Increase Senna to 2 tabs bid. Continue Miralax  daily.   -1/11 LBM today 12. Hypersomnia hx: Was in process of getting work up for OSA.   13. ABLA: recheck CBC in am.   -1/11 HGB up to 14.4 14. Low  grade temp:             -  wound looks clean, lungs are clear             -encourage IS             -monitor clinically for now  -1/11 afebrile today   LOS: 1 days A FACE TO FACE EVALUATION WAS PERFORMED  Murray Collier 12/21/2023, 7:46 PM

## 2023-12-21 NOTE — Plan of Care (Signed)
  Problem: RH Balance Goal: LTG: Patient will maintain dynamic sitting balance (OT) Description: LTG:  Patient will maintain dynamic sitting balance with assistance during activities of daily living (OT) Flowsheets (Taken 12/21/2023 1649) LTG: Pt will maintain dynamic sitting balance during ADLs with: Independent Goal: LTG Patient will maintain dynamic standing with ADLs (OT) Description: LTG:  Patient will maintain dynamic standing balance with assist during activities of daily living (OT)  Flowsheets (Taken 12/21/2023 1649) LTG: Pt will maintain dynamic standing balance during ADLs with: Supervision/Verbal cueing   Problem: Sit to Stand Goal: LTG:  Patient will perform sit to stand in prep for activites of daily living with assistance level (OT) Description: LTG:  Patient will perform sit to stand in prep for activites of daily living with assistance level (OT) Flowsheets (Taken 12/21/2023 1649) LTG: PT will perform sit to stand in prep for activites of daily living with assistance level: Supervision/Verbal cueing   Problem: RH Eating Goal: LTG Patient will perform eating w/assist, cues/equip (OT) Description: LTG: Patient will perform eating with assist, with/without cues using equipment (OT) Flowsheets (Taken 12/21/2023 1649) LTG: Pt will perform eating with assistance level of: Independent   Problem: RH Grooming Goal: LTG Patient will perform grooming w/assist,cues/equip (OT) Description: LTG: Patient will perform grooming with assist, with/without cues using equipment (OT) Flowsheets (Taken 12/21/2023 1649) LTG: Pt will perform grooming with assistance level of: Independent   Problem: RH Bathing Goal: LTG Patient will bathe all body parts with assist levels (OT) Description: LTG: Patient will bathe all body parts with assist levels (OT) Flowsheets (Taken 12/21/2023 1649) LTG: Pt will perform bathing with assistance level/cueing: Supervision/Verbal cueing LTG: Position pt will perform  bathing: Shower   Problem: RH Dressing Goal: LTG Patient will perform upper body dressing (OT) Description: LTG Patient will perform upper body dressing with assist, with/without cues (OT). Flowsheets (Taken 12/21/2023 1649) LTG: Pt will perform upper body dressing with assistance level of: Independent Goal: LTG Patient will perform lower body dressing w/assist (OT) Description: LTG: Patient will perform lower body dressing with assist, with/without cues in positioning using equipment (OT) Flowsheets (Taken 12/21/2023 1649) LTG: Pt will perform lower body dressing with assistance level of: Supervision/Verbal cueing   Problem: RH Toileting Goal: LTG Patient will perform toileting task (3/3 steps) with assistance level (OT) Description: LTG: Patient will perform toileting task (3/3 steps) with assistance level (OT)  Flowsheets (Taken 12/21/2023 1649) LTG: Pt will perform toileting task (3/3 steps) with assistance level: Supervision/Verbal cueing   Problem: RH Toilet Transfers Goal: LTG Patient will perform toilet transfers w/assist (OT) Description: LTG: Patient will perform toilet transfers with assist, with/without cues using equipment (OT) Flowsheets (Taken 12/21/2023 1649) LTG: Pt will perform toilet transfers with assistance level of: Supervision/Verbal cueing   Problem: RH Tub/Shower Transfers Goal: LTG Patient will perform tub/shower transfers w/assist (OT) Description: LTG: Patient will perform tub/shower transfers with assist, with/without cues using equipment (OT) Flowsheets (Taken 12/21/2023 1649) LTG: Pt will perform tub/shower stall transfers with assistance level of: Supervision/Verbal cueing   Problem: RH Memory Goal: LTG Patient will demonstrate ability for day to day recall/carry over during activities of daily living with assistance level (OT) Description: LTG:  Patient will demonstrate ability for day to day recall/carry over during activities of daily living with  assistance level (OT). Flowsheets (Taken 12/21/2023 1649) LTG:  Patient will demonstrate ability for day to day recall/carry over during activities of daily living with assistance level (OT): Supervision

## 2023-12-22 DIAGNOSIS — K5900 Constipation, unspecified: Secondary | ICD-10-CM | POA: Diagnosis not present

## 2023-12-22 DIAGNOSIS — G4700 Insomnia, unspecified: Secondary | ICD-10-CM | POA: Diagnosis not present

## 2023-12-22 DIAGNOSIS — I614 Nontraumatic intracerebral hemorrhage in cerebellum: Secondary | ICD-10-CM | POA: Diagnosis not present

## 2023-12-22 DIAGNOSIS — I10 Essential (primary) hypertension: Secondary | ICD-10-CM | POA: Diagnosis not present

## 2023-12-22 MED ORDER — ACETAMINOPHEN 325 MG PO TABS
650.0000 mg | ORAL_TABLET | Freq: Four times a day (QID) | ORAL | Status: DC
  Administered 2023-12-22 – 2023-12-31 (×33): 650 mg via ORAL
  Filled 2023-12-22 (×34): qty 2

## 2023-12-22 MED ORDER — MELATONIN 5 MG PO TABS
5.0000 mg | ORAL_TABLET | Freq: Every day | ORAL | Status: DC
  Administered 2023-12-22 – 2023-12-28 (×5): 5 mg via ORAL
  Filled 2023-12-22 (×6): qty 1

## 2023-12-22 NOTE — Evaluation (Signed)
 Physical Therapy Assessment and Plan  Patient Details  Name: Wayne Lee MRN: [id number] Date of Birth: [birth year]  PT Diagnosis: Abnormality of gait, Ataxia, Coordination disorder, Difficulty walking, Impaired cognition, and Pain in neck Rehab Potential: Good ELOS: 1.5 - 2 weeks   Today's Date: 12/22/2023 PT Individual Time: 1102-1202 PT Individual Time Calculation (min): 60 min    Hospital Problem: Principal Problem:   ICH (intracerebral hemorrhage) (HCC)   Past Medical History:  Past Medical History:  Diagnosis Date   Atopic dermatitis in adult    Left cataract    Obesity (BMI 30-39.9)    Undescended left testicle    had surgical intervention   Past Surgical History:  Past Surgical History:  Procedure Laterality Date   CATARACT EXTRACTION W/ INTRAOCULAR LENS IMPLANT Left 2017   CHOLECYSTECTOMY     ORCHIOPEXY  1984   SUBOCCIPITAL CRANIECTOMY CERVICAL LAMINECTOMY N/A 12/16/2023   Procedure: SUBOCCIPITAL CRANIECTOMY;  Surgeon: Colon Shove, MD;  Location: [facility] OR;  Service: Neurosurgery;  Laterality: N/A;    Assessment & Plan Clinical Impression: Patient is a 48 y.o. year old male  with history of eczema, obesity who was admitted on 12/16/23 with sudden onset of HA, emesis X 2 and dizziness. 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 UDS negative. He was started on hypertonic saline and taken to OR emergently for suboccipital craniectomy with decompression of cerebellar hemorrhage by Dr. Colon.  2 D echo revealed EF 65-70% with grade 1 DD and no wall abnormality. Dr. Jerri felt that hemorrhage likely hypertensive in nature and follow up MRI brain showed residual hematoma with small volume SAH, no evidence of tumor or lesion,  improvement in  basilar cisterns and 4th ventricle patency.  He tolerated extubation without difficulty on 01/07 and mentation slowly improving. He was started on thins and has been advanced to regular textures. Hypertonic saline tapered off and T max of 101.3 felt to be reactive and has resolved. He continues on Keppra  for seizure prophylaxis. Dr.Xu recommends cerebral angiogram once ICH resolves to rule out AVM.    Headaches have been persistent and occasionally get worse. Has also has diplopia in far left field, difficulty sleeping with some hallucinations at night, dizziness with walking and onset of intermittent hiccups today.  He continues to be limited by unsteady gait, fatigue, higher level cognitive deficits as well as headaches.  PT/OT has been working with patient who requires min to mod assist overall. CIR recommended due to functional decline.  Patient transferred to CIR on 12/20/2023 .   Patient currently requires mod with mobility secondary to muscle joint tightness, decreased cardiorespiratoy endurance, impaired timing and sequencing, abnormal tone, unbalanced muscle activation, ataxia, and decreased coordination, diplopia, decreased attention to left, decreased awareness and delayed processing, and decreased sitting balance, decreased standing balance, decreased postural control, and decreased balance strategies.  Prior to hospitalization, patient was independent  with mobility and lived with Spouse, Other (Comment) (wife, Addea, and 2 kids (8y.o. & 18months)) in a House home.  Home access is 4Stairs to enter.  Patient will benefit from skilled PT intervention to maximize safe functional mobility, minimize fall risk, and decrease caregiver burden for planned discharge home with 24 hour supervision.  Anticipate patient will benefit from follow up OP at  discharge.  PT - End of Session Activity Tolerance: Tolerates 30+ min activity with multiple rests Endurance Deficit Description: requires frequent seated  rest breaks and opportunities to lie down and rest his neck due to increased pain PT Assessment Rehab Potential (ACUTE/IP ONLY): Good PT Barriers to Discharge: Home environment access/layout PT Patient demonstrates impairments in the following area(s): Balance;Safety;Skin Integrity;Edema;Endurance;Motor;Pain;Perception PT Transfers Functional Problem(s): Bed Mobility;Bed to Chair;Car;Furniture;Floor PT Locomotion Functional Problem(s): Ambulation;Stairs PT Plan PT Intensity: Minimum of 1-2 x/day ,45 to 90 minutes PT Frequency: 5 out of 7 days PT Duration Estimated Length of Stay: 1.5 - 2 weeks PT Treatment/Interventions: Ambulation/gait training;Community reintegration;DME/adaptive equipment instruction;Neuromuscular re-education;Psychosocial support;Stair training;UE/LE Strength taining/ROM;Balance/vestibular training;Discharge planning;Pain management;Skin care/wound management;Therapeutic Activities;UE/LE Coordination activities;Cognitive remediation/compensation;Disease management/prevention;Functional mobility training;Patient/family education;Therapeutic Exercise;Visual/perceptual remediation/compensation PT Transfers Anticipated Outcome(s): mod-I using LRAD PT Locomotion Anticipated Outcome(s): supervision using LRAD PT Recommendation Recommendations for Other Services: Therapeutic Recreation consult Therapeutic Recreation Interventions: Outing/community reintergration;Stress management Follow Up Recommendations: Outpatient PT;24 hour supervision/assistance Patient destination: Home Equipment Recommended: To be determined   PT Evaluation Precautions/Restrictions Precautions Precautions: Fall;Other (comment) Precaution Comments: monitor BP, intermittent diplopia, R>L lean/LOB Restrictions Weight Bearing Restrictions Per Provider Order: No Pain Pain Assessment Pain Scale: 0-10 Pain Score: 7  Pain Type: Acute pain;Surgical pain Pain Location: Head Pain Orientation:  Posterior Pain Descriptors / Indicators: Throbbing;Other (Comment) (reports throbbing pain at incision site) Pain Onset: On-going Patients Stated Pain Goal: 0 Pain Intervention(s): Medication (See eMAR);Rest;Relaxation;Emotional support;Distraction Pain Interference Pain Interference Pain Effect on Sleep: 2. Occasionally Pain Interference with Therapy Activities: 1. Rarely or not at all Pain Interference with Day-to-Day Activities: 1. Rarely or not at all Home Living/Prior Functioning Home Living Available Help at Discharge: Family;Available 24 hours/day (wife taking FMLA) Type of Home: House Home Access: Stairs to enter [employer] of Steps: 4 Entrance Stairs-Rails: Right;Left;Can reach both Home Layout: Two level;Bed/bath upstairs;1/2 bath on main level Alternate Level Stairs-Number of Steps: full flight Alternate Level Stairs-Rails: Right;Left;Can reach both (L only on 1st half and then both remainder) Additional Comments: works emergency planning/management officer, practicing therapist, 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  Lives With: Spouse;Other (Comment) (wife, Addea, and 2 kids (8y.o. & 18months)) Prior Function Level of Independence: Independent with homemaking with ambulation;Independent with gait;Independent with transfers;Independent with basic ADLs  Able to Take Stairs?: Yes Driving: Yes Vocation: Full time employment Vision/Perception  Vision - History Ability to See in Adequate Light: 1 Impaired Vision - Assessment Eye Alignment: Within Functional Limits Ocular Range of Motion: Within Functional Limits Alignment/Gaze Preference: Within Defined Limits Tracking/Visual Pursuits: Able to track stimulus in all quads without difficulty (possible slight nystagmus in L upper field but difficult to replicate) Convergence: Within functional limits Diplopia Assessment: Objects split on top of one another Perception Perception: Impaired Preception Impairment Details:  Inattention/Neglect Perception-Other Comments: mild L inattention possible, but will continue to assess Praxis Praxis: WFL  Cognition Overall Cognitive Status: Impaired/Different from baseline Arousal/Alertness: Awake/alert Orientation Level: Oriented X4 Year: 2025 Month: January Day of Week: Correct Attention: Focused;Sustained Focused Attention: Appears intact Sustained Attention: Appears intact Safety/Judgment: Appears intact Sensation Sensation Light Touch: Appears Intact Hot/Cold: Not tested Proprioception: Impaired by gross assessment Stereognosis: Not tested Coordination Gross Motor Movements are Fluid and Coordinated: No Coordination and Movement Description: incoordination in L hemibody, truncal ataxia during functional mobility with R>L LOB Motor  Motor Motor: Other (comment);Abnormal postural alignment and control;Ataxia Motor - Skilled Clinical Observations: incoordation with truncal ataxia with R>L lean/LOB during functional mobility   Trunk/Postural Assessment  Cervical Assessment Cervical Assessment: Within Functional Limits Thoracic Assessment Thoracic Assessment: Within Functional Limits Lumbar Assessment Lumbar Assessment: Within Functional Limits Postural Control Postural Control: Deficits on evaluation Trunk Control: R > L lean/LOB during functional mobility that worsens with turning or head rotations  Balance Balance Balance Assessed: Yes Standardized Balance Assessment Standardized Balance Assessment: Functional Gait Assessment Static Sitting Balance Static Sitting - Balance Support: Feet supported Static Sitting - Level of Assistance: 6: Modified independent (Device/Increase time);5: Stand by assistance Dynamic Sitting Balance Dynamic Sitting - Balance Support: Feet supported Dynamic Sitting - Level of Assistance: 5: Stand by assistance Static Standing Balance Static Standing - Balance Support: During functional activity Static Standing -  Level of Assistance: 4: Min assist Dynamic Standing Balance Dynamic Standing - Balance Support: During functional activity Dynamic Standing - Level of Assistance: 3: Mod assist;4: Min assist Functional Gait  Assessment Gait assessed : Yes Gait Level Surface: Walks 20 ft, slow speed, abnormal gait pattern, evidence for imbalance or deviates 10-15 in outside of the 12 in walkway width. Requires more than 7 sec to ambulate 20 ft. (deviates and requires >7sec) Change in Gait Speed: Makes only minor adjustments to walking speed, or accomplishes a change in speed with significant gait deviations, deviates 10-15 in outside the 12 in walkway width, or changes speed but loses balance but is able to recover and continue walking. Gait with Horizontal Head Turns: Performs head turns with moderate changes in gait velocity, slows down, deviates 10-15 in outside 12 in walkway width but recovers, can continue to walk. (greater LOB when looking L) Gait with Vertical Head Turns: Performs task with moderate change in gait velocity, slows down, deviates 10-15 in outside 12 in walkway width but recovers, can continue to walk. Gait and Pivot Turn: Cannot turn safely, requires assistance to turn and stop. Step Over Obstacle: Cannot perform without assistance. Gait with Narrow Base of Support: Ambulates less than 4 steps heel to toe or cannot perform without assistance. Gait with Eyes Closed: Cannot walk 20 ft without assistance, severe gait deviations or imbalance, deviates greater than 15 in outside 12 in walkway width or will not attempt task. Ambulating Backwards: Cannot walk 20 ft without assistance, severe gait deviations or imbalance, deviates greater than 15 in outside 12 in walkway width or will not attempt task. Steps: Two feet to a stair, must use rail. Total Score: 5 Extremity Assessment      RLE Assessment RLE Assessment: Within Functional Limits Active Range of Motion (AROM) Comments: WFL/WNL General  Strength Comments: grossly 4+/5 to 5/5 throughout LLE Assessment LLE Assessment: Within Functional Limits Active Range of Motion (AROM) Comments: WFL/WNL General Strength Comments: grossly 4+/5 to 5/5  Care Tool Care Tool Bed Mobility Roll left and right activity   Roll left and right assist level: Supervision/Verbal cueing    Sit to lying activity   Sit to lying assist level: Contact Guard/Touching assist    Lying to sitting on side of bed activity   Lying to sitting on side of bed assist level: the ability to move from lying on the back to sitting on the side of the bed with no back support.: Contact Guard/Touching assist     Care Tool Transfers Sit to stand transfer   Sit to stand assist level: Minimal Assistance - Patient > 75%    Chair/bed transfer   Chair/bed transfer assist level: Minimal Assistance - Patient > 75%    Car transfer   Car transfer assist level: Minimal Assistance -  Patient > 75%      Care Tool Locomotion Ambulation   Assist level: Moderate Assistance - Patient 50 - 74% Assistive device: No Device Max distance: 152ft  Walk 10 feet activity   Assist level: Moderate Assistance - Patient - 50 - 74% Assistive device: No Device   Walk 50 feet with 2 turns activity   Assist level: Moderate Assistance - Patient - 50 - 74% Assistive device: No Device  Walk 150 feet activity   Assist level: Moderate Assistance - Patient - 50 - 74% Assistive device: No Device  Walk 10 feet on uneven surfaces activity   Assist level: Maximal Assistance - Patient 25 - 49% (on mulch)    Stairs   Assist level: Minimal Assistance - Patient > 75% Stairs assistive device: 2 hand rails Max number of stairs: 12  Walk up/down 1 step activity   Walk up/down 1 step (curb) assist level: Minimal Assistance - Patient > 75% Walk up/down 1 step or curb assistive device: 2 hand rails  Walk up/down 4 steps activity   Walk up/down 4 steps assist level: Minimal Assistance - Patient >  75% Walk up/down 4 steps assistive device: 2 hand rails  Walk up/down 12 steps activity   Walk up/down 12 steps assist level: Minimal Assistance - Patient > 75% Walk up/down 12 steps assistive device: 2 hand rails  Pick up small objects from floor   Pick up small object from the floor assist level: Total Assistance - Patient < 25% Pick up small object from the floor assistive device: limited due to increased neck pain when bending over  Wheelchair Is the patient using a wheelchair?: No          Wheel 50 feet with 2 turns activity      Wheel 150 feet activity        Refer to Care Plan for Long Term Goals  SHORT TERM GOAL WEEK 1 PT Short Term Goal 1 (Week 1): Pt will perform sit<>stands using LRAD with CGA PT Short Term Goal 2 (Week 1): Pt will perform bed<>chair transfers using LRAD with CGA PT Short Term Goal 3 (Week 1): Pt will ambulate at least 182ft using LRAD with CGA PT Short Term Goal 4 (Week 1): Pt will navigate at least 8 stairs using HR with CGA  Recommendations for other services: Therapeutic Recreation  Stress management and Outing/community reintegration  Skilled Therapeutic Intervention Pt received sidelying in bed awake and agreeable to therapy session. Evaluation completed (see details above) with patient education regarding purpose of PT evaluation, PT POC and goals, therapy schedule, weekly team meetings, and other CIR information including safety plan and fall risk safety. Pt performed the below functional mobility tasks with the specified levels of skilled cuing and assistance. Throughout session, pt frequently requiring breaks to support his neck either by leaning against something or lying down due to increased pain when upright, unsupported. During gait training pt with R>L lean/LOB that worsens with head rotations or turns - requiring up to mod A to maintain upright - pt also has intermittent anterior lean with slight shuffled gait pattern, but able to correct  when cued.  Pt participated in Functional Gait Assessment (FGA) with score of 5/30 demonstrating high fall risk (low fall risk 25-28, medium fall risk 19-24, and high fall risk <19).  At end of session, pt left seated EOB with needs in reach, nurse present to assume care of pt, bed alarm on, and MD present.  Mobility Bed Mobility  Bed Mobility: Sit to Supine;Supine to Sit Supine to Sit: Contact Guard/Touching assist Sit to Supine: Contact Guard/Touching assist Transfers Transfers: Sit to Stand;Stand to Sit;Stand Pivot Transfers Sit to Stand: Minimal Assistance - Patient > 75% Stand to Sit: Minimal Assistance - Patient > 75% Stand Pivot Transfers: Minimal Assistance - Patient > 75% Stand Pivot Transfer Details: Tactile cues for sequencing;Tactile cues for weight shifting;Verbal cues for precautions/safety;Verbal cues for technique;Verbal cues for gait pattern;Verbal cues for sequencing;Tactile cues for posture Transfer (Assistive device): None Locomotion  Gait Ambulation: Yes Gait Assistance: Minimal Assistance - Patient > 75%;Moderate Assistance - Patient 50-74% Gait Distance (Feet): 120 Feet (140 + 140 + 120) Assistive device: None Gait Assistance Details: Verbal cues for technique;Verbal cues for precautions/safety;Tactile cues for weight shifting;Tactile cues for sequencing;Tactile cues for initiation;Tactile cues for posture;Verbal cues for sequencing;Verbal cues for gait pattern Gait Gait: Yes Gait Pattern: Impaired Gait Pattern: Poor foot clearance - left;Poor foot clearance - right;Step-through pattern;Decreased step length - right;Decreased step length - left;Lateral trunk lean to right;Lateral trunk lean to left (R and L lean/LOB during gait that worsens with turning, occasionally starts to have anterior lean with shuffled gait) Gait velocity: decreased Stairs / Additional Locomotion Stairs: Yes Stairs Assistance: Minimal Assistance - Patient > 75% Stair Management Technique:  Two rails;Alternating pattern;Forwards;Step to pattern (alternating both then changed to step-to on descent; requires heavy min A for balance when turning at bottom of steps) Number of Stairs: 12 Height of Stairs: 6 Wheelchair Mobility Wheelchair Mobility: No   Discharge Criteria: Patient will be discharged from PT if patient refuses treatment 3 consecutive times without medical reason, if treatment goals not met, if there is a change in medical status, if patient makes no progress towards goals or if patient is discharged from hospital.  The above assessment, treatment plan, treatment alternatives and goals were discussed and mutually agreed upon: by patient  Connell CHRISTELLA Kiss , PT, DPT, NCS, CSRS 12/22/2023, 12:17 PM

## 2023-12-22 NOTE — Progress Notes (Signed)
 PROGRESS NOTE   Subjective/Complaints: Denies any pain this morning.  Reports he still had poor sleep last night.  Reports use of melatonin sometimes at home.   ROS: Patient denies fever, new vision changes, dizziness, nausea, vomiting, diarrhea,  shortness of breath or chest pain, joint pain or mood change.  +HA + insomnia  Objective:   No results found. Recent Labs    12/21/23 0607  WBC 9.6  HGB 14.4  HCT 43.7  PLT 307   Recent Labs    12/20/23 2138 12/21/23 0607  NA 134* 135  K 4.1 3.9  CL 100 100  CO2 24 25  GLUCOSE 119* 112*  BUN 16 18  CREATININE 1.15 1.19  CALCIUM  9.4 9.4    Intake/Output Summary (Last 24 hours) at 12/22/2023 1515 Last data filed at 12/22/2023 0516 Gross per 24 hour  Intake 220 ml  Output 900 ml  Net -680 ml        Physical Exam: Vital Signs Blood pressure 116/73, pulse 69, temperature 99.1 F (37.3 C), temperature source Oral, resp. rate 17, height 5' 8 (1.727 m), weight 110.9 kg, SpO2 97%.   General: No apparent distress, no hiccups noted this AM HEENT: Occiput incision C/D/I with skin glue in place. No signs of erythema.  MMM Neck: Supple without JVD or lymphadenopathy Heart: Reg rate and rhythm. Chest: CTA bilaterally without wheezes, rales, or rhonchi; no distress Abdomen: Soft, non-tender, non-distended, bowel sounds positive. Extremities: No clubbing, cyanosis, or edema. Pulses are 2+ Psych: Pt's affect is appropriate. Pt is cooperative Skin:  Hx of dermatitis worse on back--Multiple dry lesions noted down the spine and upper buttocks. Dry flaky skin elbow and multiple old lesions on BUE/BLE.  Skin is warm to touch.  Neuro:  Alert and awake.  Intact Memory. Normal language and speech. Cranial nerve 2-12 grossly intact.  Experiences diplopia to all fields, 2-3 beats of nystagmus with gaze left.  MMT: RUE and RLE 5/5. LUE and LLE 4 to 4+/5.  Sensory exam normal for  light touch and pain in all 4 limbs.  Mild left limb ataxia.  Fair sitting balance at edge of bed Musculoskeletal: No joint swelling    Assessment/Plan: 1. Functional deficits which require 3+ hours per day of interdisciplinary therapy in a comprehensive inpatient rehab setting. Physiatrist is providing close team supervision and 24 hour management of active medical problems listed below. Physiatrist and rehab team continue to assess barriers to discharge/monitor patient progress toward functional and medical goals  Care Tool:  Bathing    Body parts bathed by patient: Right arm, Left arm, Chest, Abdomen, Front perineal area, Buttocks, Right upper leg, Face, Left upper leg   Body parts bathed by helper: Right lower leg, Left lower leg     Bathing assist Assist Level: Minimal Assistance - Patient > 75%     Upper Body Dressing/Undressing Upper body dressing   What is the patient wearing?: Pull over shirt    Upper body assist Assist Level: Supervision/Verbal cueing    Lower Body Dressing/Undressing Lower body dressing      What is the patient wearing?: Pants     Lower body assist Assist for lower  body dressing: Contact Guard/Touching assist     Toileting Toileting    Toileting assist Assist for toileting: Minimal Assistance - Patient > 75%     Transfers Chair/bed transfer  Transfers assist     Chair/bed transfer assist level: Minimal Assistance - Patient > 75%     Locomotion Ambulation   Ambulation assist      Assist level: Moderate Assistance - Patient 50 - 74% Assistive device: No Device Max distance: 178ft   Walk 10 feet activity   Assist     Assist level: Moderate Assistance - Patient - 50 - 74% Assistive device: No Device   Walk 50 feet activity   Assist    Assist level: Moderate Assistance - Patient - 50 - 74% Assistive device: No Device    Walk 150 feet activity   Assist    Assist level: Moderate Assistance - Patient - 50 -  74% Assistive device: No Device    Walk 10 feet on uneven surface  activity   Assist     Assist level: Maximal Assistance - Patient 25 - 49% (on mulch)     Wheelchair     Assist Is the patient using a wheelchair?: No             Wheelchair 50 feet with 2 turns activity    Assist            Wheelchair 150 feet activity     Assist          Blood pressure 116/73, pulse 69, temperature 99.1 F (37.3 C), temperature source Oral, resp. rate 17, height 5' 8 (1.727 m), weight 110.9 kg, SpO2 97%.   Medical Problem List and Plan: 1. 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  -Continue CIR  -DC PIV 2.  Antithrombotics: -DVT/anticoagulation:  Pharmaceutical: Heparin  added on 12/20/23             -antiplatelet therapy: N/A 3. Pain Management: tylenol  prn.               --see below 4. Mood/Behavior/Sleep: LCSW to follow for evaluation and support.              --Gabapentin  300 mg/hs to assist with sleep/HA and hypnagogic hallucinations             -antipsychotic agents: N/A  -1/11 Insomnia last night, discussed can try trazodone  PRN  1/12 at at bedtime melatonin 5. Neuropsych/cognition: This patient is capable of making decisions on his own behalf. 6. Skin/Wound Care: Monitor incision for healing.  7. Fluids/Electrolytes/Nutrition: Monitor I/O. Check CMET in am 8. HTN: BP has been well controlled PTA--will monitor BP TID --hydralazine prn added. -1/12 BP stable, monitor     12/22/2023    1:05 PM 12/22/2023    4:44 AM 12/21/2023    8:14 PM  Vitals with BMI  Systolic 116 110 881  Diastolic 73 67 70  Pulse 69 90 59    9. Persistent HA: related to bleed/post-op --Add Topamax  25 mg bid as well as Gabapentin  100 mg bid/300 mg/hs as above -add tramadol  for breakthrough headache. D/c fioricet .  -1/12  intermittent use of tramadol , will schedule Tylenol  4 times daily 650 mg 10. Vestibular sx: - Has had dizziness and diplopia--meclizine  prn  added --has dark glasses to help with light sensitivity.  -recover will be more about acclimation and tolerance 11. Constipation: Increase Senna to 2 tabs bid. Continue Miralax  daily.   -1/12 LBM yesterday 12. Hypersomnia hx: Was in process of getting work up for OSA.   13. ABLA: recheck CBC in am.   -1/11 HGB up to 14.4 14. Low grade temp:             -wound looks clean, lungs are clear             -encourage IS             -monitor clinically for now  -1/12 Tmax 100.3 last night, no signs of infection noted, recheck labs tomorrow morning  LOS: 2 days A FACE TO FACE EVALUATION WAS PERFORMED  Murray Collier 12/22/2023, 3:15 PM

## 2023-12-22 NOTE — Plan of Care (Signed)
  Problem: RH Balance Goal: LTG Patient will maintain dynamic sitting balance (PT) Description: LTG:  Patient will maintain dynamic sitting balance with assistance during mobility activities (PT) Flowsheets (Taken 12/22/2023 1222) LTG: Pt will maintain dynamic sitting balance during mobility activities with:: Independent with assistive device  Goal: LTG Patient will maintain dynamic standing balance (PT) Description: LTG:  Patient will maintain dynamic standing balance with assistance during mobility activities (PT) Flowsheets (Taken 12/22/2023 1222) LTG: Pt will maintain dynamic standing balance during mobility activities with:: Supervision/Verbal cueing   Problem: Sit to Stand Goal: LTG:  Patient will perform sit to stand with assistance level (PT) Description: LTG:  Patient will perform sit to stand with assistance level (PT) Flowsheets (Taken 12/22/2023 1222) LTG: PT will perform sit to stand in preparation for functional mobility with assistance level: Independent with assistive device   Problem: RH Bed Mobility Goal: LTG Patient will perform bed mobility with assist (PT) Description: LTG: Patient will perform bed mobility with assistance, with/without cues (PT). Flowsheets (Taken 12/22/2023 1222) LTG: Pt will perform bed mobility with assistance level of: Independent with assistive device    Problem: RH Bed to Chair Transfers Goal: LTG Patient will perform bed/chair transfers w/assist (PT) Description: LTG: Patient will perform bed to chair transfers with assistance (PT). Flowsheets (Taken 12/22/2023 1222) LTG: Pt will perform Bed to Chair Transfers with assistance level: Independent with assistive device    Problem: RH Car Transfers Goal: LTG Patient will perform car transfers with assist (PT) Description: LTG: Patient will perform car transfers with assistance (PT). Flowsheets (Taken 12/22/2023 1222) LTG: Pt will perform car transfers with assist:: Supervision/Verbal cueing    Problem: RH Ambulation Goal: LTG Patient will ambulate in controlled environment (PT) Description: LTG: Patient will ambulate in a controlled environment, # of feet with assistance (PT). Flowsheets (Taken 12/22/2023 1222) LTG: Pt will ambulate in controlled environ  assist needed:: Supervision/Verbal cueing LTG: Ambulation distance in controlled environment: >134ft using LRAD Goal: LTG Patient will ambulate in home environment (PT) Description: LTG: Patient will ambulate in home environment, # of feet with assistance (PT). Flowsheets (Taken 12/22/2023 1222) LTG: Pt will ambulate in home environ  assist needed:: Supervision/Verbal cueing LTG: Ambulation distance in home environment: 45ft using LRAD   Problem: RH Stairs Goal: LTG Patient will ambulate up and down stairs w/assist (PT) Description: LTG: Patient will ambulate up and down # of stairs with assistance (PT) Flowsheets (Taken 12/22/2023 1222) LTG: Pt will ambulate up/down stairs assist needed:: Supervision/Verbal cueing LTG: Pt will  ambulate up and down number of stairs: at least 12 steps using HRs per home set-up

## 2023-12-23 DIAGNOSIS — I614 Nontraumatic intracerebral hemorrhage in cerebellum: Secondary | ICD-10-CM | POA: Diagnosis not present

## 2023-12-23 LAB — CBC
HCT: 41.7 % (ref 39.0–52.0)
Hemoglobin: 14 g/dL (ref 13.0–17.0)
MCH: 27.2 pg (ref 26.0–34.0)
MCHC: 33.6 g/dL (ref 30.0–36.0)
MCV: 81 fL (ref 80.0–100.0)
Platelets: 287 10*3/uL (ref 150–400)
RBC: 5.15 MIL/uL (ref 4.22–5.81)
RDW: 12.7 % (ref 11.5–15.5)
WBC: 8.4 10*3/uL (ref 4.0–10.5)
nRBC: 0 % (ref 0.0–0.2)

## 2023-12-23 MED ORDER — DICLOFENAC SODIUM 1 % EX GEL
2.0000 g | Freq: Four times a day (QID) | CUTANEOUS | Status: DC
  Administered 2023-12-23 – 2023-12-28 (×3): 2 g via TOPICAL
  Filled 2023-12-23 (×2): qty 100

## 2023-12-23 MED ORDER — MUSCLE RUB 10-15 % EX CREA
TOPICAL_CREAM | CUTANEOUS | Status: DC | PRN
  Filled 2023-12-23: qty 85

## 2023-12-23 MED ORDER — TRAMADOL HCL 50 MG PO TABS
50.0000 mg | ORAL_TABLET | Freq: Four times a day (QID) | ORAL | Status: DC | PRN
Start: 2023-12-23 — End: 2023-12-25
  Administered 2023-12-23 – 2023-12-25 (×4): 100 mg via ORAL
  Filled 2023-12-23 (×5): qty 2

## 2023-12-23 MED ORDER — METHOCARBAMOL 750 MG PO TABS
750.0000 mg | ORAL_TABLET | Freq: Four times a day (QID) | ORAL | Status: DC | PRN
  Administered 2023-12-23 – 2023-12-24 (×3): 750 mg via ORAL
  Filled 2023-12-23 (×3): qty 1

## 2023-12-23 NOTE — Progress Notes (Signed)
 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 He has never used smokeless tobacco. He reports that he does not drink alcohol  and does not use drugs.  Family / Support Systems Marital Status: Married How Long?: 13 years Patient Roles: Spouse, Parent Spouse/Significant Other: Addea (wife) Children: 2 children- 7 y.o. dtr and 10 mos old son Other Supports: None reported Anticipated Caregiver: Wife Ability/Limitations of Caregiver: Wife will be primary caregiver Caregiver Availability: 24/7 Family Dynamics: Pt lives with his wife and children.  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 Wife is working on getting FMLA forms Market Researcher Issues: Pt admits to 6 mos jail sentence in 2000. Denies anything current. Guardian/Conservator: Denies   Abuse/Neglect Abuse/Neglect Assessment Can Be Completed: Yes Physical Abuse: Denies Verbal Abuse: Denies, provider concerned (Comment) Sexual Abuse: Denies, provider concered (Comment) Exploitation of patient/patient's resources: Denies, provider concerned (Comment) Self-Neglect: Denies, provider concerned (Comment)  Patient response to: Social Isolation - How often do you feel lonely or isolated from those around you?: Never  Emotional Status Pt's affect, behavior and adjustment status: Pt in good spirits at time of visit. Recent Psychosocial Issues: Denies Psychiatric History: Denies Substance Abuse History: Denies  Patient / Family Perceptions, Expectations & Goals Pt/Family understanding of illness & functional limitations: Pt and family have a general understanding of care needs Premorbid pt/family roles/activities: Independent Anticipated changes in roles/activities/participation: Assistance with ADLs/IADLs Pt/family expectations/goals: Pt goal is to work on being able to take cre of my home, wife, and children; have neough energy to carry out these needs; be a good leader.  Community Resources [employer]: None Premorbid Home Care/DME Agencies: None Transportation available at discharge: wife Is the patient able to respond to transportation needs?: Yes In the past 12 months, has lack of transportation kept you from medical appointments or from getting medications?: No In the past 12 months, has lack of transportation kept you from meetings, work, or from getting things needed for daily living?: No Resource referrals recommended: Neuropsychology  Discharge Planning Living Arrangements: Spouse/significant other Support Systems: Spouse/significant other Type of Residence: Private residence Insurance Resources: Media Planner (specify)  (Event organiser) Financial Resources: Employment Surveyor, Quantity Screen Referred: No Living Expenses: Banker Management: Patient, Spouse Does the patient have any problems obtaining your medications?: No Home Management:  Both manage home care needs Patient/Family Preliminary Plans: TBD Care Coordinator Barriers to Discharge: Decreased caregiver support, Lack of/limited family support, Insurance for SNF coverage Care Coordinator Anticipated Follow Up Needs: HH/OP Expected length of stay: 10-14 days  Clinical Impression SW met with pt, pt wife, and pt dtr in room. Pt is not a cytogeneticist. No HCPOA. No DME.   Graeme DELENA Jude 12/23/2023, 3:34 PM

## 2023-12-23 NOTE — Progress Notes (Signed)
 Inpatient Rehabilitation  Patient information reviewed and entered into eRehab system by Burnard Mealing, OTR/L, Rehab Quality Coordinator.   Information including medical coding, functional ability and quality indicators will be reviewed and updated through discharge.

## 2023-12-23 NOTE — Discharge Instructions (Addendum)
 Inpatient Rehab Discharge Instructions  Wayne Lee Discharge date and time:  01/21/24Contact MD if you develop any problems with your incision/wound--redness, swelling, increase in pain, drainage or if you develop fever or chills.   Activities/Precautions/ Functional Status: Activity: no lifting, driving, or strenuous exercise till cleared by MD Diet: regular diet Wound Care: keep wound clean and dry  Functional status:  ___ No restrictions     ___ Walk up steps independently _X__ 24/7 supervision/assistance   ___ Walk up steps with assistance ___ Intermittent supervision/assistance  ___ Bathe/dress independently ___ Walk with walker     ___ Bathe/dress with assistance ___ Walk Independently    ___ Shower independently ___ Walk with assistance    _X__ Shower with assistance _X__ No alcohol      ___ Return to work/school ________  Special Instructions: No aspirin, aspirin containing products or NSAIDS (aleve, ibuprofen , naprosyn, motrin , etc) 2. NO driving till cleared by Wayne Lee or Neurology.    COMMUNITY REFERRALS UPON DISCHARGE:    Outpatient: PT     OT                 Agency:[facility]- [place] location     Phone:[phone]              Appointment Date/Time:*Please expect follow-up within 7-10 business days to schedule your appointment. If you have not received follow-up, be sure to contact the site directly.*     My questions have been answered and I understand these instructions. I will adhere to these goals and the provided educational materials after my discharge from the hospital.  Patient/Caregiver Signature _______________________________ Date __________  Clinician Signature _______________________________________ Date __________  Please bring this form and your medication list with you to all your follow-up doctor's appointments.

## 2023-12-23 NOTE — IPOC Note (Addendum)
 Overall Plan of Care [facility]) Patient Details Name: Wayne Lee MRN: [id number] DOB: 1976/07/21  Admitting Diagnosis: ICH (intracerebral hemorrhage) [facility])  Hospital Problems: Principal Problem:   ICH (intracerebral hemorrhage) (HCC)     Functional Problem List: Nursing Pain, Bowel, Safety, Endurance, Medication Management  PT Balance, Safety, Skin Integrity, Edema, Endurance, Motor, Pain, Perception  OT Balance, Cognition, Endurance, Motor, Pain, Perception, Safety  SLP Cognition  TR         Basic ADL's: OT Bathing, Dressing, Toileting, Grooming     Advanced  ADL's: OT       Transfers: PT Bed Mobility, Bed to Chair, Car, [employer], Floor  OT Toilet, Tub/Shower     Locomotion: PT Ambulation, Stairs     Additional Impairments: OT None  SLP        TR      Anticipated Outcomes Item Anticipated Outcome  Self Feeding independent  Swallowing      Basic self-care  set up to supervision  Toileting  set up   Bathroom Transfers supervision  Bowel/Bladder  manage bowel w mod I assist  Transfers  mod-I using LRAD  Locomotion  supervision using LRAD  Communication     Cognition  sup  Pain  < 4 with prns  Safety/Judgment  manage w cues   Therapy Plan: PT Intensity: Minimum of 1-2 x/day ,45 to 90 minutes PT Frequency: 5 out of 7 days PT Duration Estimated Length of Stay: 1.5 - 2 weeks OT Intensity: Minimum of 1-2 x/day, 45 to 90 minutes OT Frequency: 5 out of 7 days OT Duration/Estimated Length of Stay: 10-12 days SLP Intensity: Minumum of 1-2 x/day, 30 to 90 minutes SLP Frequency: 1 to 3 out of 7 days SLP Duration/Estimated Length of Stay: 10-12 days   Team Interventions: Nursing Interventions Patient/Family Education, Disease Management/Prevention, Discharge Planning, Pain Management, Bowel Management, Medication Management  PT interventions Ambulation/gait training, Community reintegration, DME/adaptive equipment instruction, Neuromuscular  re-education, Psychosocial support, Stair training, UE/LE Strength taining/ROM, Warden/ranger, Discharge planning, Pain management, Skin care/wound management, Therapeutic Activities, UE/LE Coordination activities, Cognitive remediation/compensation, Disease management/prevention, Functional mobility training, Patient/family education, Therapeutic Exercise, Visual/perceptual remediation/compensation  OT Interventions Balance/vestibular training, Cognitive remediation/compensation, Discharge planning, Community reintegration, DME/adaptive equipment instruction, Neuromuscular re-education, Functional mobility training, Functional electrical stimulation, Pain management, Patient/family education, Psychosocial support, Self Care/advanced ADL retraining, Therapeutic Exercise, Therapeutic Activities, UE/LE Strength taining/ROM, Visual/perceptual remediation/compensation, UE/LE Coordination activities  SLP Interventions Cognitive remediation/compensation, Therapeutic Activities, Functional tasks, Therapeutic Exercise  TR Interventions    SW/CM Interventions Discharge Planning, Psychosocial Support, Patient/Family Education   Barriers to Discharge MD  Medical stability  Nursing Decreased caregiver support, Home environment access/layout 2 level 4 ste bil rails;works instructor of mental health therapist, practicing therapist, 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  PT Home environment access/layout    OT      SLP      SW Decreased caregiver support, Lack of/limited family support, Community Education Officer for SNF coverage     Team Discharge Planning: Destination: PT-Home ,OT- Home , SLP-Home Projected Follow-up: PT-Outpatient PT, 24 hour supervision/assistance, OT-  Outpatient OT, SLP-None Projected Equipment Needs: PT-To be determined, OT- To be determined, SLP-None recommended by SLP Equipment Details: PT- , OT-  Patient/family involved in discharge planning: PT- Patient,  OT-Patient, Family member/caregiver,  SLP-Patient, Family member/caregiver  MD ELOS: 10-12 days Medical Rehab Prognosis:  Excellent Assessment: The patient has been admitted for CIR therapies with the diagnosis of left cerebellar ICH s/p crani. The team will be addressing functional mobility, strength,  stamina, balance, safety, adaptive techniques and equipment, self-care, bowel and bladder mgt, patient and caregiver education, NMR, vestibular rx, cognition, communication. Goals have been set at supervision to mod I with mobility, self-care, and cognition/communication. Anticipated discharge destination is home with wife.        See Team Conference Notes for weekly updates to the plan of care

## 2023-12-23 NOTE — Progress Notes (Signed)
 Occupational Therapy Session Note  Patient Details  Name: Wayne Lee MRN: [id number] Date of Birth: August 31, 1976  Session 1 Today's Date: 12/23/2023 OT Individual Time: 9163-9067 OT Individual Time Calculation (min): 56 min    Session 2 Today's Date: 12/23/2023 OT Individual Time: 1420-1530 OT Individual Time Calculation (min): 70 min    Short Term Goals: Week 1:  OT Short Term Goal 1 (Week 1): Pt will be able to ambulate to bathroom with min A of 1 person. OT Short Term Goal 2 (Week 1): Pt will be able to step over tub wall with CGA and use of grab bars. OT Short Term Goal 3 (Week 1): pt will be able to don socks and shoes with supervision. OT Short Term Goal 4 (Week 1): Pt will be able to recall 3 activities he did in his last therapy session to demonstrate improvement in day to day memory.  Skilled Therapeutic Interventions/Progress Updates:    Session 1 Pt received supine with un-rated HA, agreeable to OT session despite another night of not sleeping. He came to EOB with mod A. He sat EOB and reported neck stiffness and tightness all throughout upper traps- addressed at end of session. He completed sit > stand from EOB with CGA. He ambulated into the bathroom with min A, no device. Transfer onto toilet, requiring min A for clothing management. He sat and voided BM. Standing level hygiene with CGA steadying assist. He transferred into the shower and completed UB bathing seated with (S). Extra time spent with warm water on his neck/shoulder for pain relief. Encouraged gentle AROM of the neck. He required min cueing for completing LB seated. CGA for LB bathing. He returned to EOB to dress. His wife assisted with lotion application. He donned underwear/shorts with min A sit <> stand. He was provided a moist heat pack and given demo of theracane use for upper trap/neck pain relief from stiffness. Patient required increased time for initiation, cuing, rest breaks, and for completion of tasks  throughout session d/t pain and fatigue. Utilized therapeutic use of self throughout to promote efficiency. Pt was left sitting up in the recliner with all needs met and call bell within reach. Wife present.    Session 2 Pt received sitting in the recliner with c/o pain in his upper traps, agreeable to session. Worked on gentle AROM at the cervical spine and myofascial release for pain relief. ~10 ft functional mobility to the w/c with no device, CGA, occasional sway requiring min A. Pt was taken via w/c to the therapy gym for time management. He completed static balance activity with focus on single leg stepping to a target to challenge core stabilization and reduce fall risk. He required min A overall. He then completed 3x10 scapular retractions with a level 2 resistance band to improve pec opening and upright posture. Patient required increased time for initiation, cuing, rest breaks, and for completion of tasks throughout session. Utilized therapeutic use of self throughout to promote efficiency. He then completed functional step ups onto a 10 in step to challenge dynamic balance, endurance, and LE strengthening for carryover to ADL transfers. HR monitored intermittently, up to 125 bpm with activity but coming down with rest break to 100 bpm. He then completed 2 trials of 150 ft of functional mobility, requiring min A overall for L postural sway/lean. He ended with BUE [facility] task, working both proprioception of the LUE and FMC in a pincer grasp, spinning nuts onto a screw at 90 degrees of shoulder  flexion. Min cueing overall. Intended carryover to [facility] tasks during ADLs. He returned to his room and was left supine with all needs met, bed alarm set. Encouraged him to set an alarm on his phone so he would not sleep too much and risk interfering with nighttime sleep.    Therapy Documentation Precautions:  Precautions Precautions: Fall, Other (comment) Precaution Comments: monitor BP, intermittent diplopia,  R>L lean/LOB Restrictions Weight Bearing Restrictions Per Provider Order: No  Therapy/Group: Individual Therapy  Nena VEAR Moats 12/23/2023, 6:35 AM

## 2023-12-23 NOTE — Care Management (Signed)
 [facility] Individual Statement of Services  Patient Name:  Wayne Lee  Date:  12/23/2023  Welcome to the [facility]patient [facility].  Our goal is to provide you with an individualized program based on your diagnosis and situation, designed to meet your specific needs.  With this comprehensive rehabilitation program, you will be expected to participate in at least 3 hours of rehabilitation therapies Monday-Friday, with modified therapy programming on the weekends.  Your rehabilitation program will include the following services:  Physical Therapy (PT), Occupational Therapy (OT), Speech Therapy (ST), 24 hour per day rehabilitation nursing, Therapeutic Recreaction (TR), Psychology, Neuropsychology, Care Coordinator, Rehabilitation Medicine, Nutrition Services, Pharmacy Services, and Other  Weekly team conferences will be held on Tuesdays to discuss your progress.  Your Inpatient Rehabilitation Care Coordinator will talk with you frequently to get your input and to update you on team discussions.  Team conferences with you and your family in attendance may also be held.  Expected length of stay: 10-14 days    Overall anticipated outcome: Independent with Assistive Device  Depending on your progress and recovery, your program may change. Your Inpatient Rehabilitation Care Coordinator will coordinate services and will keep you informed of any changes. Your Inpatient Rehabilitation Care Coordinator's name and contact numbers are listed  below.  The following services may also be recommended but are not provided by the Inpatient [facility] Center:  Driving Evaluations Home Health Rehabiltiation Services Outpatient Rehabilitation Services Vocational Rehabilitation   Arrangements will be made to provide these services after discharge if needed.  Arrangements include referral to agencies that provide these services.  Your insurance has been verified to be:  [employer]  Your primary doctor is:  No PCP listed  Pertinent information will be shared with your doctor and your insurance company.  Inpatient Rehabilitation Care Coordinator:  Graeme Feliciana SILK [phone] or [phone]  Information discussed with and copy given to patient by: Graeme DELENA Feliciana, 12/23/2023, 9:59 AM

## 2023-12-23 NOTE — Progress Notes (Signed)
 Physical Therapy Session Note  Patient Details  Name: Wayne Lee MRN: [id number] Date of Birth: [birth year]  Today's Date: 12/23/2023 PT Individual Time: 1105-1200 PT Individual Time Calculation (min): 55 min   Short Term Goals: Week 1:  PT Short Term Goal 1 (Week 1): Pt will perform sit<>stands using LRAD with CGA PT Short Term Goal 2 (Week 1): Pt will perform bed<>chair transfers using LRAD with CGA PT Short Term Goal 3 (Week 1): Pt will ambulate at least 130ft using LRAD with CGA PT Short Term Goal 4 (Week 1): Pt will navigate at least 8 stairs using HR with CGA Week 2:     Skilled Therapeutic Interventions/Progress Updates:     Pt received seated in recliner and agrees to therapy. Reports pain in head and both shoulders. RN present to provide pain medication and voltaren  gel for shoulders. PT provides rest breaks as needed to manage pain. Pt performs sit to stand with minA and ambulates to WC with miNA and cues for posture, positioning, and body mechanics. WC transport to gym. Pt ambulates x175' without AD, with minA at trunk for stability and cues for upright gaze to improve posture and balance. Seated rest break. Pt then performs targeted foot taps on colored cones to challenge coordination and balance. PT provides minA/modA and cues for gluteus medius engagement, as well as lateral weight shifting. Pt performs obstacle course gait training to force pt to making turns and challenge dynamic balance. Pt completes course, weaving in and out of cones placed 4' apart, with minA overall and cues for weight shifting. Pt also performs tapping Lt foot on each one and then Rt foot on each cone. Activity progressed by moving cones closer together to encourage pt to perform 180 degree turns in sequence. Pt requires minA/modA for activity, with increased postural instability. WC transport back to room. PT encourages pt to remain sitting to work on endurance as well as normalizing sleep-wake cycle. Pt is  agreeable. Left seated with all needs within reach.    Therapy Documentation Precautions:  Precautions Precautions: Fall, Other (comment) Precaution Comments: monitor BP, intermittent diplopia, R>L lean/LOB Restrictions Weight Bearing Restrictions Per Provider Order: No   Therapy/Group: Individual Therapy  Elsie JAYSON Dawn, PT, DPT 12/23/2023, 4:02 PM

## 2023-12-23 NOTE — Progress Notes (Signed)
 PROGRESS NOTE   Subjective/Complaints: No events overnight.  Patient complaining of soreness in his bilateral shoulders, neck today.  Contributing to headache.  Also complaining of ongoing insomnia, but his general schedule at home is to go to bed at 5 AM, so he is working on staying awake during the day to improve his sleep at night.  A.m. labs stable.  Vital stable, within normal limits. Eating relatively well, did breakfast yesterday. Continent of bowel and bladder, last bowel movement 1-11, large  ROS: Patient denies fever, new vision changes, dizziness, nausea, vomiting, diarrhea,  shortness of breath or chest pain, joint pain or mood change.  +HA + insomnia-improved  Objective:   No results found. Recent Labs    12/21/23 0607 12/23/23 0532  WBC 9.6 8.4  HGB 14.4 14.0  HCT 43.7 41.7  PLT 307 287   Recent Labs    12/20/23 2138 12/21/23 0607  NA 134* 135  K 4.1 3.9  CL 100 100  CO2 24 25  GLUCOSE 119* 112*  BUN 16 18  CREATININE 1.15 1.19  CALCIUM  9.4 9.4    Intake/Output Summary (Last 24 hours) at 12/23/2023 0816 Last data filed at 12/22/2023 2105 Gross per 24 hour  Intake 560 ml  Output 550 ml  Net 10 ml        Physical Exam: Vital Signs Blood pressure 136/79, pulse 62, temperature 98.2 F (36.8 C), resp. rate 17, height 5' 8 (1.727 m), weight 110.1 kg, SpO2 96%.   General: No apparent distress, no hiccups noted this AM.  Sitting upright in bedside.  Doing well. HEENT: Occiput incision C/D/I with skin glue in place. No signs of erythema.  MMM Neck: Supple without JVD or lymphadenopathy Heart: Reg rate and rhythm. Chest: CTA bilaterally without wheezes, rales, or rhonchi; no distress Abdomen: Soft, non-tender, non-distended, bowel sounds positive. Extremities: No clubbing, cyanosis, or edema. Pulses are 2+ Psych: Pt's affect is appropriate. Pt is cooperative Skin:  Hx of dermatitis worse on  back--Multiple dry lesions noted down the spine and upper buttocks. Dry flaky skin elbow and multiple old lesions on BUE/BLE.  Skin is warm to touch.  -No lesions appreciated on gross exam today.  Neuro:  Alert and awake.  Intact Memory. Normal language and speech. Cranial nerve 2-12 grossly intact.  Experiences diplopia to all fields, 2-3 beats of nystagmus with gaze left. --Unchanged  MMT: RUE and RLE 5/5. LUE and LLE 4 to 4+/5.  Sensory exam normal for light touch and pain in all 4 limbs.  Mild left limb ataxia.  Musculoskeletal: No joint swelling .  Moderate tenderness to palpation in bilateral neck, shoulders.   Assessment/Plan: 1. Functional deficits which require 3+ hours per day of interdisciplinary therapy in a comprehensive inpatient rehab setting. Physiatrist is providing close team supervision and 24 hour management of active medical problems listed below. Physiatrist and rehab team continue to assess barriers to discharge/monitor patient progress toward functional and medical goals  Care Tool:  Bathing    Body parts bathed by patient: Right arm, Left arm, Chest, Abdomen, Front perineal area, Buttocks, Right upper leg, Face, Left upper leg   Body parts bathed by helper: Right  lower leg, Left lower leg     Bathing assist Assist Level: Minimal Assistance - Patient > 75%     Upper Body Dressing/Undressing Upper body dressing   What is the patient wearing?: Pull over shirt    Upper body assist Assist Level: Supervision/Verbal cueing    Lower Body Dressing/Undressing Lower body dressing      What is the patient wearing?: Pants     Lower body assist Assist for lower body dressing: Contact Guard/Touching assist     Toileting Toileting    Toileting assist Assist for toileting: Minimal Assistance - Patient > 75%     Transfers Chair/bed transfer  Transfers assist     Chair/bed transfer assist level: Minimal Assistance - Patient > 75%      Locomotion Ambulation   Ambulation assist      Assist level: Moderate Assistance - Patient 50 - 74% Assistive device: No Device Max distance: 140ft   Walk 10 feet activity   Assist     Assist level: Moderate Assistance - Patient - 50 - 74% Assistive device: No Device   Walk 50 feet activity   Assist    Assist level: Moderate Assistance - Patient - 50 - 74% Assistive device: No Device    Walk 150 feet activity   Assist    Assist level: Moderate Assistance - Patient - 50 - 74% Assistive device: No Device    Walk 10 feet on uneven surface  activity   Assist     Assist level: Maximal Assistance - Patient 25 - 49% (on mulch)     Wheelchair     Assist Is the patient using a wheelchair?: No             Wheelchair 50 feet with 2 turns activity    Assist            Wheelchair 150 feet activity     Assist          Blood pressure 136/79, pulse 62, temperature 98.2 F (36.8 C), resp. rate 17, height 5' 8 (1.727 m), weight 110.1 kg, SpO2 96%.   Medical Problem List and Plan: 1. 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  -Continue CIR  -DC PIV  2.  Antithrombotics: -DVT/anticoagulation:  Pharmaceutical: Heparin  added on 12/20/23             -antiplatelet therapy: N/A 3. Pain Management: tylenol  prn.               --see below  -1-13: Increased muscle pain/soreness for patient.  Increase tramadol  to 50 to 100 mg, add Robaxin  750 mg 3 times daily as needed, and add muscle rub Bengay twice daily for soreness.  4. Mood/Behavior/Sleep: LCSW to follow for evaluation and support.              --Gabapentin  300 mg/hs to assist with sleep/HA and hypnagogic hallucinations             -antipsychotic agents: N/A  -1/11 Insomnia last night, discussed can try trazodone  PRN  1/12 at at bedtime  melatonin--does not seem to have made a difference, problem is mostly due to general internal clock having him go to bed at 5 AM, patient is working on strategies to stay awake during  the day to improve his sleep schedule here at night.  Will continue current measures, but do not feel escalation of these will be of much benefit without sedating patient.  5. Neuropsych/cognition: This patient is capable of making decisions on his own behalf. 6. Skin/Wound Care: Monitor incision for healing.  7. Fluids/Electrolytes/Nutrition: Monitor I/O. Check CMET in am 8. HTN: BP has been well controlled PTA--will monitor BP TID --hydralazine prn added. -1/12-13 BP stable, monitor     12/23/2023    5:34 AM 12/23/2023    5:33 AM 12/22/2023    7:46 PM  Vitals with BMI  Weight 242 lbs 12 oz    BMI 36.91    Systolic  136 121  Diastolic  79 77  Pulse  62 82    9. Persistent HA: related to bleed/post-op --Add Topamax  25 mg bid as well as Gabapentin  100 mg bid/300 mg/hs as above -add tramadol  for breakthrough headache. D/c fioricet .  -1/12 intermittent use of tramadol , will schedule Tylenol  4 times daily 650 mg -1-13: Headache less bothersome  10. Vestibular sx: - Has had dizziness and diplopia--meclizine  prn added --has dark glasses to help with light sensitivity.  -recover will be more about acclimation and tolerance May benefit from vestibular therapy, will discuss with therapy teams this week  11. Constipation: Increase Senna to 2 tabs bid. Continue Miralax  daily.   -1/12 LBM yesterday  12. Hypersomnia hx: Was in process of getting work up for OSA.    -May benefit from oxygen study prior to discharge from rehab.  13. ABLA: recheck CBC in am.   -1/11 HGB up to 14.4; 1-13 stable at 14  14. Low grade temp:             -wound looks clean, lungs are clear             -encourage IS             -monitor clinically for now  -1/12 Tmax 100.3 last night, no signs of infection noted, recheck labs  tomorrow morning  1-13: WBC remained stable, last fever recorded 1-11  LOS: 3 days A FACE TO FACE EVALUATION WAS PERFORMED  Wayne Lee Likes 12/23/2023, 8:16 AM

## 2023-12-24 DIAGNOSIS — I614 Nontraumatic intracerebral hemorrhage in cerebellum: Secondary | ICD-10-CM | POA: Diagnosis not present

## 2023-12-24 NOTE — Progress Notes (Signed)
 Speech Language Pathology Discharge Summary  Patient Details  Name: Wayne Lee MRN: [id number] Date of Birth: [birth year]  Date of Discharge from SLP service:December 24, 2023  Today's Date: 12/24/2023 SLP Individual Time: 9193-9154 SLP Individual Time Calculation (min): 39 min  Skilled Therapeutic Interventions:  SLP conducted skilled therapy session targeting cognitive retraining goals. Patient oriented x4 and great historian of events leading up to inpatient rehabilitation stay, though aware of memory loss surrounding the event itself. SLP facilitated 2 high-level deductive reasoning and spatial reasoning tasks with patient at modI for deductive reasoning and supervision for complex/advanced visuospatial skills. Patient notes that he is at baseline level of cognitive function and reports no cognitive changes compared to prior-stroke function. Wife corroborates. As patient has neared baseline function, no further SLP services indicated at this time. Please see full discharge summary below.   Patient has met 3 of 3 long term goals.  Patient to discharge at overall Modified Independent;Supervision level.  Reasons goals not met: n/a   Clinical Impression/Discharge Summary: Patient has made excellent progress towards cognitive therapy goals during inpatient rehabilitation admission, meeting 3/3 long term goals set and nearing/at baseline level of cognitive functioning. Patient is currently modified independent for basic/mildly complex cognitive activities and intermittent supervision for complex cognitive tasks in the domains of memory, attention, and problem solving. Patient will not need SLP services at next venue of care, as patient and wife both report patient is at baseline level of function. SLP will sign off.   Care Partner:  Caregiver Able to Provide Assistance: Yes  Type of Caregiver Assistance: Cognitive  Recommendation: None    Equipment: n/a   Reasons for discharge: Treatment  goals met   Patient/Family Agrees with Progress Made and Goals Achieved: Yes   Vangie Henthorn, M.A., CCC-SLP  Wendy Hoback A Mylynn Dinh 12/24/2023, 9:30 AM

## 2023-12-24 NOTE — Progress Notes (Signed)
 PROGRESS NOTE   Subjective/Complaints: No events overnight.  Vitals stable. Continues to complain of tenderness in his posterior neck, mostly at the base of his skull near his surgical site.  Small amount of fluctuance here that is tender to palpation.  Patient agreeable to trying a ice pack for local pain control.  Otherwise, he states medication changes yesterday were significantly helpful in pain control.  He managed to stay up all of yesterday and slept much better last night.  States he will be mindful that sleep medications may cause a little bit of sedation into the morning, and to let me know if this is the case.  ROS: Patient denies fever, new vision changes, dizziness, nausea, vomiting, diarrhea,  shortness of breath or chest pain, joint pain or mood change.  +HA-improved + Local neck/shoulder pain -ongoing, improved  + insomnia-significantly improved  Objective:   No results found. Recent Labs    12/23/23 0532  WBC 8.4  HGB 14.0  HCT 41.7  PLT 287   No results for input(s): NA, K, CL, CO2, GLUCOSE, BUN, CREATININE, CALCIUM  in the last 72 hours.   Intake/Output Summary (Last 24 hours) at 12/24/2023 1020 Last data filed at 12/23/2023 1933 Gross per 24 hour  Intake 240 ml  Output 700 ml  Net -460 ml        Physical Exam: Vital Signs Blood pressure 114/66, pulse 72, temperature 98.8 F (37.1 C), resp. rate 16, height 5' 8 (1.727 m), weight 110.1 kg, SpO2 99%.   General: No apparent distress.  Sitting up in bed. HEENT: Occiput incision C/D/I with skin glue in place.  Very small area of fluctuant edema at the most superior portion of the incision site.  Tender to palpation.  Neck: Supple without JVD or lymphadenopathy.  Mild tenderness to palpation through neck, shoulders. Heart: Reg rate and rhythm.  No murmurs, rubs, or gallops.  Peripheral pulses intact. Chest: CTA bilaterally without  wheezes, rales, or rhonchi; no distress Abdomen: Soft, non-tender, non-distended, bowel sounds positive. Psych: Pt's affect is appropriate. Pt is cooperative  Skin: Back dermatitis along buttocks and spine not appreciated on today's exam  Neuro:  Alert and awake.  Intact Memory. Normal language and speech. Cranial nerve 2-12 grossly intact.  Experiences diplopia to all fields, 2-3 beats of nystagmus with gaze left.  Improving. MMT: RUE and RLE 5/5. LUE and LLE 4 to 4+/5.  Sensory exam normal for light touch and pain in all 4 limbs.  No appreciable ataxia on exam; prior left upper extremity ataxia Musculoskeletal: No joint swelling .  Moderate tenderness to palpation in bilateral neck, shoulders.   Assessment/Plan: 1. Functional deficits which require 3+ hours per day of interdisciplinary therapy in a comprehensive inpatient rehab setting. Physiatrist is providing close team supervision and 24 hour management of active medical problems listed below. Physiatrist and rehab team continue to assess barriers to discharge/monitor patient progress toward functional and medical goals  Care Tool:  Bathing    Body parts bathed by patient: Right arm, Left arm, Chest, Abdomen, Front perineal area, Buttocks, Right upper leg, Face, Left upper leg   Body parts bathed by helper: Right lower leg, Left lower  leg     Bathing assist Assist Level: Minimal Assistance - Patient > 75%     Upper Body Dressing/Undressing Upper body dressing   What is the patient wearing?: Pull over shirt    Upper body assist Assist Level: Supervision/Verbal cueing    Lower Body Dressing/Undressing Lower body dressing      What is the patient wearing?: Pants     Lower body assist Assist for lower body dressing: Contact Guard/Touching assist     Toileting Toileting    Toileting assist Assist for toileting: Minimal Assistance - Patient > 75%     Transfers Chair/bed transfer  Transfers assist      Chair/bed transfer assist level: Minimal Assistance - Patient > 75%     Locomotion Ambulation   Ambulation assist      Assist level: Moderate Assistance - Patient 50 - 74% Assistive device: No Device Max distance: 124ft   Walk 10 feet activity   Assist     Assist level: Moderate Assistance - Patient - 50 - 74% Assistive device: No Device   Walk 50 feet activity   Assist    Assist level: Moderate Assistance - Patient - 50 - 74% Assistive device: No Device    Walk 150 feet activity   Assist    Assist level: Moderate Assistance - Patient - 50 - 74% Assistive device: No Device    Walk 10 feet on uneven surface  activity   Assist     Assist level: Maximal Assistance - Patient 25 - 49% (on mulch)     Wheelchair     Assist Is the patient using a wheelchair?: No             Wheelchair 50 feet with 2 turns activity    Assist            Wheelchair 150 feet activity     Assist          Blood pressure 114/66, pulse 72, temperature 98.8 F (37.1 C), resp. rate 16, height 5' 8 (1.727 m), weight 110.1 kg, SpO2 99%.   Medical Problem List and Plan: 1. 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 - 12/31/23  -Continue CIR   - 1/14: Wife very supportive. Limited by pain in traps and neck per OT, supervision with UBD and Min A with transfer/toiletting. PT CGA transfers and Min-Mod for ambulation without AD, limited by balance deficits and endurance issues. At cognitive baseline per family, SLP recommending intermittent supervision but no new goals.   2.  Antithrombotics: -DVT/anticoagulation:  Pharmaceutical: Heparin  added on 12/20/23             -antiplatelet therapy: N/A 3. Pain Management: tylenol  prn.               --see below  -1-13: Increased muscle pain/soreness for patient.  Increase  tramadol  to 50 to 100 mg, add Robaxin  750 mg 3 times daily as needed, and add muscle rub Bengay twice daily for soreness.  1-14: Local pain on posterior head, asked nursing to apply ice to this area.  Otherwise, pain better controlled with current regimen.  4. Mood/Behavior/Sleep: LCSW to follow for evaluation and support.              --Gabapentin   300 mg/hs to assist with sleep/HA and hypnagogic hallucinations             -- antipsychotic agents: N/A  -- 1/11 Insomnia last night, discussed can try trazodone  PRN  -- 1/12 at at bedtime melatonin--does not seem to have made a difference, problem is mostly due to general internal clock having him go to bed at 5 AM, patient is working on strategies to stay awake during the day to improve his sleep schedule here at night.  Will continue current measures, but do not feel escalation of these will be of much benefit without sedating patient.  1-14: Patient slept much better overnight with staying up during the day; continue medications for 1 more day, then start to reduce if sleep remains adequate  5. Neuropsych/cognition: This patient is capable of making decisions on his own behalf. 6. Skin/Wound Care: Monitor incision for healing.  7. Fluids/Electrolytes/Nutrition: Monitor I/O. Check CMET in am 8. HTN: BP has been well controlled PTA--will monitor BP TID --hydralazine prn added. -1/12-14 BP stable, monitor     12/24/2023    5:45 AM 12/23/2023    7:52 PM 12/23/2023    2:05 PM  Vitals with BMI  Systolic 114 124 869  Diastolic 66 60 85  Pulse 72 72 85    9. Persistent HA: related to bleed/post-op --Add Topamax  25 mg bid as well as Gabapentin  100 mg bid/300 mg/hs as above -add tramadol  for breakthrough headache. D/c fioricet .  -1/12 intermittent use of tramadol , will schedule Tylenol  4 times daily 650 mg No further complaints of headache  10. Vestibular sx: - Has had dizziness and diplopia--meclizine  prn added --has dark glasses to help with  light sensitivity.  -recover will be more about acclimation and tolerance - May benefit from vestibular therapy, will discuss with therapy teams this week; none recommended per PT/OT, domes seem to be self resolving  11. Constipation: Increase Senna to 2 tabs bid. Continue Miralax  daily.   -1/12 LBM yesterday  12. Hypersomnia hx: Was in process of getting work up for OSA.    -May benefit from oxygen study prior to discharge from rehab.  13. ABLA: recheck CBC in am.   -1/11 HGB up to 14.4; 1-13 stable at 14  14. Low grade temp:             -wound looks clean, lungs are clear             -encourage IS             -monitor clinically for now  -1/12 Tmax 100.3 last night, no signs of infection noted, recheck labs tomorrow morning  1-13: WBC remained stable, last fever recorded 1-11  LOS: 4 days A FACE TO FACE EVALUATION WAS PERFORMED  Wayne Lee 12/24/2023, 10:20 AM

## 2023-12-24 NOTE — Progress Notes (Signed)
 Occupational Therapy Session Note  Patient Details  Name: Wayne Lee MRN: [id number] Date of Birth: 1976/04/17  Today's Date: 12/24/2023 OT Individual Time: 1035-1105 OT Individual Time Calculation (min): 30 min    Short Term Goals: Week 1:  OT Short Term Goal 1 (Week 1): Pt will be able to ambulate to bathroom with min A of 1 person. OT Short Term Goal 2 (Week 1): Pt will be able to step over tub wall with CGA and use of grab bars. OT Short Term Goal 3 (Week 1): pt will be able to don socks and shoes with supervision. OT Short Term Goal 4 (Week 1): Pt will be able to recall 3 activities he did in his last therapy session to demonstrate improvement in day to day memory.  Skilled Therapeutic Interventions/Progress Updates:    Pt received sitting up with c/o throbbing HA, unrated but willing to participate and premedicated. Pt agreeable to OT session. He required assist to dial number to cafeteria to order lunch. He stood from the recliner with min A. 10 ft of functional mobility to the w/c with min A, postural sway requiring stabilization at the core. Pt was taken via w/c to the therapy gym for time management. Remainder of session focused on LUE [facility] and coordination using several activities on the BITS. He was overall ~75% accurate with reaching to a static and moving target. Discussed carryover to IADL/vocation tasks and importance of maximizing use. He returned to his room and was left sitting up in the recliner with all needs met.    Therapy Documentation Precautions:  Precautions Precautions: Fall, Other (comment) Precaution Comments: monitor BP, intermittent diplopia, R>L lean/LOB Restrictions Weight Bearing Restrictions Per Provider Order: No  Therapy/Group: Individual Therapy  Wayne Lee 12/24/2023, 6:41 AM

## 2023-12-24 NOTE — Patient Care Conference (Signed)
 Inpatient RehabilitationTeam Conference and Plan of Care Update Date: 12/24/2023   Time: 1019am    Patient Name: Wayne Lee      Medical Record Number: [id number]  Date of Birth: [birth year] Sex: Male         Room/Bed: 4W14C/4W14C-01 Payor Info: Payor: [employer] / Plan: [facility] / Product Type: *No Product type* /    Admit Date/Time:  12/20/2023  5:39 PM  Primary Diagnosis:  ICH (intracerebral hemorrhage) [facility])  Hospital Problems: Principal Problem:   ICH (intracerebral hemorrhage) [facility])    Expected Discharge Date: Expected Discharge Date: 12/31/23  Team Members Present: Physician leading conference: Dr. Joesph Likes Social Worker Present: Graeme Jude, LCSWA Nurse Present: Barnie Ronde, RN;Ulla Mckiernan Sula, RN;Karen Hilliard, RN PT Present: Kirt Dawn, PT OT Present: Nena Moats, OT SLP Present: Rosina Downy, SLP PPS Coordinator present : Burnard Mealing, OT     Current Status/Progress Goal Weekly Team Focus  Bowel/Bladder      Continent B/B          Swallow/Nutrition/ Hydration               ADL's   (S) UB ADLs, min A LB, min A transfers with no device, limited by pain in his traps/neck   Supervision to mod I   ADLs, transfers, pain management, family edu, d/c planning    Mobility   supervision bed mobility, CGA transfers, minA/modA ambulation without AD   supervision  endurance, high level balance, head turning, gait training, family ed, DC prep    Communication                Safety/Cognition/ Behavioral Observations               Pain      H/a     <4 w/ prns   Topamax , tylenol , tramadol , etc prn, monitor effectiveness and need for prn meds   Skin      N/a           Discharge Planning:  Pt will discharge to home with his wife who will be primary caregiver. SW will confirm there are no barriers to discharge.   Team Discussion: Patient is admitted post ICH/ crani with  no cognitive deficits noted. Limited by pain in his  neck and poor endurance.  Patient on target to meet rehab goals: yes,  currently needs minimal assist with lower body care and toileting. Complete transfer with CGA and needs min-mod A to ambulate w/o an assistive device due to loss of balance. Goals for d/c set for supervision overall.  *See Care Plan and progress notes for long and short-term goals.   Revisions to Treatment Plan:  SLP signed off; theracare ; heat application and stretches Treadmill   Teaching Needs: Safety, medications,transfers, toileting, etc.   Current Barriers to Discharge: none  Possible Resolutions to Barriers: Family education;  OP follow-up services     Medical Summary Current Status: medically complicated by  poor pain control through the neck and shoulders, constipation, insomnia, headache, vertigo, and cognitive deficits  Barriers to Discharge: Behavior/Mood;Medical stability;Uncontrolled Pain;Self-care education   Possible Resolutions to [employer]: titrate pain medications to minimum tolerated doses for function, titrate bowel medications, promote sleep/wake cycle and frequent reorientation, monitor vitals and increase BP regimen as approrpiate, monitor labs   Continued Need for Acute Rehabilitation Level of Care: The patient requires daily medical management by a physician with specialized training in physical medicine and rehabilitation for the following reasons: Direction of a  multidisciplinary physical rehabilitation program to maximize functional independence : Yes Medical management of patient stability for increased activity during participation in an intensive rehabilitation regime.: Yes Analysis of laboratory values and/or radiology reports with any subsequent need for medication adjustment and/or medical intervention. : Yes   I attest that I was present, lead the team conference, and concur with the assessment and plan of the team.   Wayne Lee 12/24/2023, 2:49 PM

## 2023-12-24 NOTE — Plan of Care (Signed)
  Problem: RH Problem Solving Goal: LTG Patient will demonstrate problem solving for (SLP) Description: LTG:  Patient will demonstrate problem solving for basic/complex daily situations with cues  (SLP) Outcome: Completed/Met   Problem: RH Memory Goal: LTG Patient will demonstrate ability for day to day (SLP) Description: LTG:   Patient will demonstrate ability for day to day recall/carryover during cognitive/linguistic activities with assist  (SLP) Outcome: Completed/Met   Problem: RH Attention Goal: LTG Patient will demonstrate this level of attention during functional activites (SLP) Description: LTG:  Patient will will demonstrate this level of attention during functional activites (SLP) Outcome: Completed/Met

## 2023-12-24 NOTE — Progress Notes (Signed)
 Patient ID: Wayne Lee, male   DOB: 10-09-1976, 48 y.o.   MRN: [id number]  1234- SW spoke with pt wife Wayne Lee to provide updates from team conference, d/c date 1/21, fam edu, and outpatient therapies for PT/OT. Fam edu scheduled Thursday (1/16) 8am-10am. Preferred outpatient location- [facility] location. She will be out of town on Friday morning and will return on Saturday evening.   *SW met with pt in room to provide above updates.   Graeme Jude, MSW, LCSW Office: [phone] Cell: [phone] Fax: [phone]

## 2023-12-24 NOTE — Progress Notes (Signed)
 Physical Therapy Session Note  Patient Details  Name: Wayne Lee MRN: [id number] Date of Birth: [birth year]  Today's Date: 12/24/2023 PT Individual Time: 0850-1000 PT Individual Time Calculation (min): 70 min   Today's Date: 12/24/2023 PT Individual Time: 8697-8655 PT Individual Time Calculation (min): 42 min   Short Term Goals: Week 1:  PT Short Term Goal 1 (Week 1): Pt will perform sit<>stands using LRAD with CGA PT Short Term Goal 2 (Week 1): Pt will perform bed<>chair transfers using LRAD with CGA PT Short Term Goal 3 (Week 1): Pt will ambulate at least 118ft using LRAD with CGA PT Short Term Goal 4 (Week 1): Pt will navigate at least 8 stairs using HR with CGA  Skilled Therapeutic Interventions/Progress Updates:     1st Session: Pt received sidelying in bed and agrees to therapy. Pt reports pulsating pain in head, but had taken pain meds prior to session. PT provides rest breaks as needed to manage pain.  Bed mobility with cues for positioning and safety. Pt dons shirt at EOB with cues for positioning. PT assists to tie shoes prior to mobility. Pt ambulates to dayroom with minA/modA due to LOB to the Lt during turn. PT provides cues for stability and gaze utilization to improve balance. Pt completes ambulation on treadmill with litegait for bodywieight supported gait training. Pt steps up onto treadmill with CGA and cues for step sequencing, then is able to remain standing with UE support as PT assists to don harness. Pt performs warm-up with 5:00 ambulation at 1.0 mph for 414'. Pt takes seated rest break. Following, pt ambulates 3:36 at 1.0 mph with incorporation of horizontal head turns Rt and lt to challenge dynamic balance. Pt utilizes either RUE or LUE support during activity, and is noted to have increased postural sway with head turns. Pt verbalizing fatigue and PT assesses BP in sitting, 120/83. Following rest break, pt performs sidestepping to the Rt, x3:00 at 0.4 increasing 0.6  mph. Pt utilizes RUE support then progresses to no upper extremity support. PT provides cues for body mechanics and utilizing gaze to help stability. Following rest break, pt completes Lt side stepping at 0.6 mph for 3:00. Initially pt appears to have improved stability but begins to have increased difficulty with task, and verbalizes increasing fatigue. Pt able to stand with CGA as PT doff harness. Pt ambulates back to room with minA and no AD. Left seated in recliner with all needs within reach   2nd Session: Pt received seated in recliner and agrees to therapy. Reports pain in neck and head. Number not provided. PT provides gentle mobility and rest breaks to manage pain. Pt performs sit to stand with CGA, then ambulates x100' to gym with x1 LOB to the Rt, requiring minA for safety. Pt completes Nustep for endurance and reciprocal coordination training, in addition to mobility of shoulders and neck incision. Pt completes x10:00 at workload of 5 with average steps per minute ~50. PT provides cues for hand and foot placement and completing full available ROM. Following, pt ambulates x300' with minA and cues for lateral head turns to challenge dynamic balance. Seated rest break. Pt performs x20 high nee marches with minA at trunk and cues for posture. Pt ambulates back to room. Left seated with alarm intact and all needs within reach.   Therapy Documentation Precautions:  Precautions Precautions: Fall, Other (comment) Precaution Comments: monitor BP, intermittent diplopia, R>L lean/LOB Restrictions Weight Bearing Restrictions Per Provider Order: No   Therapy/Group: Individual Therapy  Elsie JAYSON Dawn, PT, DPT 12/24/2023, 4:09 PM

## 2023-12-25 DIAGNOSIS — I614 Nontraumatic intracerebral hemorrhage in cerebellum: Secondary | ICD-10-CM | POA: Diagnosis not present

## 2023-12-25 MED ORDER — TRAMADOL HCL 50 MG PO TABS: 50.0000 mg | ORAL_TABLET | Freq: Four times a day (QID) | ORAL | Status: DC | PRN

## 2023-12-25 MED ORDER — TRAMADOL HCL 50 MG PO TABS: 50.0000 mg | ORAL_TABLET | Freq: Three times a day (TID) | ORAL | Status: DC

## 2023-12-25 MED ORDER — TRAMADOL HCL 50 MG PO TABS
50.0000 mg | ORAL_TABLET | Freq: Three times a day (TID) | ORAL | Status: DC
Start: 2023-12-25 — End: 2023-12-31
  Administered 2023-12-25 – 2023-12-31 (×19): 50 mg via ORAL
  Filled 2023-12-25 (×20): qty 1

## 2023-12-25 MED ORDER — TRAMADOL HCL 50 MG PO TABS
50.0000 mg | ORAL_TABLET | Freq: Four times a day (QID) | ORAL | Status: DC | PRN
  Administered 2023-12-25 – 2023-12-27 (×3): 50 mg via ORAL
  Filled 2023-12-25 (×3): qty 1

## 2023-12-25 NOTE — Progress Notes (Signed)
 Occupational Therapy Session Note  Patient Details  Name: Emett Orfield MRN: [id number] Date of Birth: [birth year]  Session 1 Today's Date: 12/25/2023 OT Individual Time: 4098-1191 OT Individual Time Calculation (min): 41 min    Session 2 Today's Date: 12/25/2023 OT Individual Time: 4782-9562 OT Individual Time Calculation (min): 75 min    Short Term Goals: Week 1:  OT Short Term Goal 1 (Week 1): Pt will be able to ambulate to bathroom with min A of 1 person. OT Short Term Goal 2 (Week 1): Pt will be able to step over tub wall with CGA and use of grab bars. OT Short Term Goal 3 (Week 1): pt will be able to don socks and shoes with supervision. OT Short Term Goal 4 (Week 1): Pt will be able to recall 3 activities he did in his last therapy session to demonstrate improvement in day to day memory.  Skilled Therapeutic Interventions/Progress Updates:    Session 1 Pt received sitting at the sink, unrated HA and throbbing pain in upper neck. Premedicated and agreeable to session. Discharge discussion and planning with pt and his wife. Pt was taken via w/c to the therapy gym for time management. Remainder of session focused on dynamic balance, core stabilization, and LE coordination for carryover to ADL transfers at home and threshold management. He completed dynamic stepping forward, backward, and laterally onto a 8 in step, requiring fluctuating CGA- min A overall, occasional mod A with large trunk sway. He required extended seated rest breaks between trials. He returned to his room following. Pt was left sitting up in the w/c with all needs met, chair alarm set, and call bell within reach. Moist heat pack provided for upper traps pain relief.    Session 2 Pt received sitting 6/10 HA, no request for pain intervention, rest breaks provided as needed. Pt was taken via w/c to the therapy gym for time management. He completed a full tub transfer to simulate home tub usage (pt preference vs shower).  He required CGA overall. Wife was present for edu- discussed carryover to home and (S) to provide. Pt completed blocked practice sit <> stand, 3x8 repetitions with two 7 lb dumbbells with overhead reach to challenge generalized strengthening and coordination for ADL transfers, as well as increasing functional activity tolerance and cardiorespiratory endurance. Pt required CGA overall with rest breaks between each set. He was then guided through gentle AROM of the cervical spine for pain relief and improved mobility. He then completed weighted crunches with posterior support to reduce strain on cervical spine- requiring (S) overall and cueing for technique- 3x10 repetitions for carryover to improved standing balance support. He then completed the BITS functional reaching to address LUE [facility] and gross motor coordination. 1st trial- 64% on size 2 dots for 3 minutes- 76 repetitions, 2nd trial- 80% and 68 repetitions. He returned to his room via w/c. He returned to supine. Handout provided re theraputty instructions and level 2 theraputty provided. He was left with all needs met, bed alarm set.     Therapy Documentation Precautions:  Precautions Precautions: Fall, Other (comment) Precaution Comments: monitor BP, intermittent diplopia, R>L lean/LOB Restrictions Weight Bearing Restrictions Per Provider Order: No   Therapy/Group: Individual Therapy  Una Ganser 12/25/2023, 9:45 AM

## 2023-12-25 NOTE — Progress Notes (Signed)
 Met with patient to review current situation, team conferences and plan of care. Reviewed medications and dietary recommendations of secondary risks including HTN/ pre- DM A1C 6.1 /HLD 81/149 and follow up with primary care. Continue to follow up along to discharge, to provide educational need and to facilitate preparation for discharge.

## 2023-12-25 NOTE — Progress Notes (Signed)
 Patient ID: Wayne Lee, male   DOB: 1976-01-22, 48 y.o.   MRN: [id number]  SW faxed outpatient PT/OT referral to [facility] location ([phone]/f:[phone]).  Norval Been, MSW, LCSW Office: [phone] Cell: [phone] Fax: [phone]

## 2023-12-25 NOTE — Progress Notes (Signed)
PROGRESS NOTE   Subjective/Complaints: No events overnight. Continues to have poorly controlled headaches and neck pain; states he does well with current medications but that they run out too quickly.  Vitals remained stable. Last bowel movement 1-13, continent of bowel bladder. P.o. intakes adequate  ROS: Patient denies fever, new vision changes, dizziness, nausea, vomiting, diarrhea,  shortness of breath or chest pain, joint pain or mood change.  +HA - ongoing, improved + Local neck/shoulder pain -ongoing, improved  + insomnia-  improved  Objective:   No results found. Recent Labs    12/23/23 0532  WBC 8.4  HGB 14.0  HCT 41.7  PLT 287   No results for input(s): "NA", "K", "CL", "CO2", "GLUCOSE", "BUN", "CREATININE", "CALCIUM" in the last 72 hours.   Intake/Output Summary (Last 24 hours) at 12/25/2023 0748 Last data filed at 12/24/2023 1947 Gross per 24 hour  Intake 717 ml  Output --  Net 717 ml        Physical Exam: Vital Signs Blood pressure 113/71, pulse 72, temperature 99.1 F (37.3 C), resp. rate 18, height 5\' 8"  (1.727 m), weight 110.1 kg, SpO2 98%.   General: No apparent distress. Laying in bedside chair.  HEENT: Occiput incision C/D/I with skin glue in place.  Very small area of fluctuant edema at the most superior portion of the incision site.  Tender to palpation. - unchanged 1/15 + Mild nystagmus on leftward gaze Mild tenderness to palpation through neck, shoulders. Heart: Reg rate and rhythm.  No murmurs, rubs, or gallops.  Peripheral pulses intact. Chest: CTA bilaterally without wheezes, rales, or rhonchi; no distress Abdomen: Soft, non-tender, non-distended, bowel sounds positive. Psych: Pt's affect is appropriate. Pt is cooperative  Skin: Back dermatitis along buttocks and spine not appreciated    Neuro:  Alert and awake.  Intact Memory. Normal language and speech. Cranial nerve 2-12 grossly  intact.  Experiences diplopia to all fields, 2-3 beats of nystagmus with gaze left.  Improving. MMT: RUE and RLE 5/5. LUE and LLE 4 to 4+/5.  - unchanged Sensory exam normal for light touch and pain in all 4 limbs.  No appreciable ataxia on exam; prior left upper extremity ataxia   Assessment/Plan: 1. Functional deficits which require 3+ hours per day of interdisciplinary therapy in a comprehensive inpatient rehab setting. Physiatrist is providing close team supervision and 24 hour management of active medical problems listed below. Physiatrist and rehab team continue to assess barriers to discharge/monitor patient progress toward functional and medical goals  Care Tool:  Bathing    Body parts bathed by patient: Right arm, Left arm, Chest, Abdomen, Front perineal area, Buttocks, Right upper leg, Face, Left upper leg   Body parts bathed by helper: Right lower leg, Left lower leg     Bathing assist Assist Level: Minimal Assistance - Patient > 75%     Upper Body Dressing/Undressing Upper body dressing   What is the patient wearing?: Pull over shirt    Upper body assist Assist Level: Supervision/Verbal cueing    Lower Body Dressing/Undressing Lower body dressing      What is the patient wearing?: Pants     Lower body assist Assist for  lower body dressing: Contact Guard/Touching assist     Toileting Toileting    Toileting assist Assist for toileting: Minimal Assistance - Patient > 75%     Transfers Chair/bed transfer  Transfers assist     Chair/bed transfer assist level: Minimal Assistance - Patient > 75%     Locomotion Ambulation   Ambulation assist      Assist level: Moderate Assistance - Patient 50 - 74% Assistive device: No Device Max distance: 166ft   Walk 10 feet activity   Assist     Assist level: Moderate Assistance - Patient - 50 - 74% Assistive device: No Device   Walk 50 feet activity   Assist    Assist level: Moderate Assistance  - Patient - 50 - 74% Assistive device: No Device    Walk 150 feet activity   Assist    Assist level: Moderate Assistance - Patient - 50 - 74% Assistive device: No Device    Walk 10 feet on uneven surface  activity   Assist     Assist level: Maximal Assistance - Patient 25 - 49% (on mulch)     Wheelchair     Assist Is the patient using a wheelchair?: No             Wheelchair 50 feet with 2 turns activity    Assist            Wheelchair 150 feet activity     Assist          Blood pressure 113/71, pulse 72, temperature 99.1 F (37.3 C), resp. rate 18, height 5\' 8"  (1.727 m), weight 110.1 kg, SpO2 98%.   Medical Problem List and Plan: 1. 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 - 12/31/23  -Continue CIR   - 1/14: Wife very supportive. Limited by pain in traps and neck per OT, supervision with UBD and Min A with transfer/toiletting. PT CGA transfers and Min-Mod for ambulation without AD, limited by balance deficits and endurance issues. At cognitive baseline per family, SLP recommending intermittent supervision but no new goals.   2.  Antithrombotics: -DVT/anticoagulation:  Pharmaceutical: Heparin added on 12/20/23             -antiplatelet therapy: N/A 3. Pain Management: tylenol prn.               --see below  -1-13: Increased muscle pain/soreness for patient.  Increase tramadol to 50 to 100 mg, add Robaxin 750 mg 3 times daily as needed, and add muscle rub Bengay twice daily for soreness.  1-14: Local pain on posterior head, asked nursing to apply ice to this area.  Otherwise, pain better controlled with current regimen.  1-15: Pain well-controlled on current regimen but having trouble staying ahead of pain - scheduled tramadol 50 mg TID and reduce PRN to 50 mg Q6H.   4. Mood/Behavior/Sleep: LCSW to  follow for evaluation and support.              --Gabapentin 300 mg/hs to assist with sleep/HA and hypnagogic hallucinations             -- antipsychotic agents: N/A  -- 1/11 Insomnia last night, discussed can try trazodone PRN  -- 1/12 at at bedtime melatonin--does not seem to  have made a difference, problem is mostly due to general internal clock having him go to bed at 5 AM, patient is working on strategies to stay awake during the day to improve his sleep schedule here at night.  Will continue current measures, but do not feel escalation of these will be of much benefit without sedating patient.  1-14: Patient slept much better overnight with staying up during the day; continue medications for 1 more day, then start to reduce if sleep remains adequate  1-15: Not requiring trazodone as needed, continue melatonin scheduled - remains improved overall  5. Neuropsych/cognition: This patient is capable of making decisions on his own behalf. 6. Skin/Wound Care: Monitor incision for healing - healing well.  7. Fluids/Electrolytes/Nutrition: Monitor I/O. Check CMET in am 8. HTN: BP has been well controlled PTA--will monitor BP TID --hydralazine prn added. -1/12-15 BP stable, monitor     12/25/2023    4:39 AM 12/24/2023    7:48 PM 12/24/2023    3:05 PM  Vitals with BMI  Systolic 113 112 914  Diastolic 71 56 90  Pulse 72 69 82    9. Persistent HA: related to bleed/post-op --Add Topamax 25 mg bid as well as Gabapentin 100 mg bid/300 mg/hs as above -add tramadol for breakthrough headache. D/c fioricet.  -1/12 intermittent use of tramadol, will schedule Tylenol 4 times daily 650 mg No further complaints of headache  10. Vestibular sx: - Has had dizziness and diplopia--meclizine prn added --has dark glasses to help with light sensitivity.  -recover will be more about acclimation and tolerance - May benefit from vestibular therapy, will discuss with therapy teams this week; none recommended per  PT/OT, domes seem to be self resolving 1/15: Used as needed meclizine yesterday for dizziness, continue PRN   11. Constipation: Increase Senna to 2 tabs bid. Continue Miralax daily.   -1/13 LBM    12. Hypersomnia hx: Was in process of getting work up for OSA.    -May benefit from oxygen study prior to discharge from rehab.  13. ABLA: recheck CBC in am.   -1/11 HGB up to 14.4; 1-13 stable at 14  14. Low grade temp:             -wound looks clean, lungs are clear             -encourage IS             -monitor clinically for now  -1/12 Tmax 100.3 last night, no signs of infection noted, recheck labs tomorrow morning  1-13: WBC remained stable, last fever recorded 1-11  LOS: 5 days A FACE TO FACE EVALUATION WAS PERFORMED  Angelina Sheriff 12/25/2023, 7:48 AM

## 2023-12-25 NOTE — Progress Notes (Signed)
 Physical Therapy Session Note  Patient Details  Name: Wayne Lee MRN: [id number] Date of Birth: 04-05-1976  Today's Date: 12/25/2023 PT Individual Time: 6578-4696 PT Individual Time Calculation (min): 43 min   Today's Date: 12/25/2023 PT Individual Time: 1120-1200 PT Individual Time Calculation (min): 40 min   Short Term Goals: Week 1:  PT Short Term Goal 1 (Week 1): Pt will perform sit<>stands using LRAD with CGA PT Short Term Goal 2 (Week 1): Pt will perform bed<>chair transfers using LRAD with CGA PT Short Term Goal 3 (Week 1): Pt will ambulate at least 133ft using LRAD with CGA PT Short Term Goal 4 (Week 1): Pt will navigate at least 8 stairs using HR with CGA  Skilled Therapeutic Interventions/Progress Updates:     1st Session: Pt received seated in recliner and agrees to therapy. Reports pain in head and neck. Number not provided. PT provides rest breaks as needed to manage pain. Pt ambulates to gym with minA for stability and tactile cues for uprigh tposture. Pt takes seated rest break. Pt performs x20 alternating foot taps on 3.5" step with CGA/minA, and cues for body mechanics and lateral weight shifting, as well as utilizing vision to improve balance. Following rest break, pt performs x10 alternating step ups, performing trunk rotations to each side holding onto 2 lb ball with BUEs, requiring minA for stability and cues for weight distribution. Following rest break, pt performs additional bout with improved stability and improved positioning of hips for symmetrical weight distribution. Pt completes x2 more bouts of x10 trunk rotations, with improving balance and independence, requiring occasional minA but primarily CGA. Following rest break, pt ambulates x175' while holding onto tidal tank with both hands to provide unstable objet and force pt to make postural adjustment during dynamic balance activity. Pt also performs x6 shoulder presses with cues for body mechanics. PT provides  minA for trunk stability. Pt left seated in recliner with alarm intact and all needs within reach.   2nd Session: Pt received seated in recliner and agrees to therapy. Reports pain in head and shoulders. PT provides rest breaks and mobility to manage pain. Pt perform sit to stand with CGA and cues for initiation. Pt ambulates to main gym with minA and no AD, with tactile cues for stability as well gaze utilization and head turns to improve balance, especially when turning. Pt performs coordination and strength training on mat. Initially pt performs alternating arm raises in quadruped, tapping hand on blue platform at head level, x10 with each hand. Pt progresses to tapping contralateral side of platform to engage core and increase challenge. Following rest break, pt performs crawling in quadruped forward and backward, with CGA and cues for body mechanics and safe positioning. Pt completes 5x5' forward and backward. Following rest break, pt completes same activity, but in tall kneeling to challenge hip stability and extensor strength. Pt returns to seated with CGA. Pt ambulates back to room with same assistance, as well as cues to take caution when navigating crowded environment. Pt left seated with alarm intact and all needs within reach.   Therapy Documentation Precautions:  Precautions Precautions: Fall, Other (comment) Precaution Comments: monitor BP, intermittent diplopia, R>L lean/LOB Restrictions Weight Bearing Restrictions Per Provider Order: No   Therapy/Group: Individual Therapy  Neva Barban, PT, DPT 12/25/2023, 4:22 PM

## 2023-12-26 DIAGNOSIS — I614 Nontraumatic intracerebral hemorrhage in cerebellum: Secondary | ICD-10-CM | POA: Diagnosis not present

## 2023-12-26 LAB — BASIC METABOLIC PANEL
Anion gap: 10 (ref 5–15)
BUN: 15 mg/dL (ref 6–20)
CO2: 25 mmol/L (ref 22–32)
Calcium: 9.3 mg/dL (ref 8.9–10.3)
Chloride: 99 mmol/L (ref 98–111)
Creatinine, Ser: 1.11 mg/dL (ref 0.61–1.24)
GFR, Estimated: >60 mL/min (ref >60)
Glucose, Bld: 115 mg/dL — ABNORMAL HIGH (ref 70–99)
Potassium: 3.5 mmol/L (ref 3.5–5.1)
Sodium: 134 mmol/L — ABNORMAL LOW (ref 135–145)

## 2023-12-26 LAB — CBC
HCT: 40.3 % (ref 39.0–52.0)
Hemoglobin: 13.6 g/dL (ref 13.0–17.0)
MCH: 27.5 pg (ref 26.0–34.0)
MCHC: 33.7 g/dL (ref 30.0–36.0)
MCV: 81.6 fL (ref 80.0–100.0)
Platelets: 322 10*3/uL (ref 150–400)
RBC: 4.94 MIL/uL (ref 4.22–5.81)
RDW: 12.8 % (ref 11.5–15.5)
WBC: 5.1 10*3/uL (ref 4.0–10.5)
nRBC: 0 % (ref 0.0–0.2)

## 2023-12-26 MED ORDER — POTASSIUM CHLORIDE CRYS ER 20 MEQ PO TBCR
20.0000 meq | EXTENDED_RELEASE_TABLET | Freq: Every day | ORAL | Status: AC
  Administered 2023-12-27 – 2023-12-30 (×4): 20 meq via ORAL
  Filled 2023-12-26 (×4): qty 1

## 2023-12-26 NOTE — Progress Notes (Signed)
Occupational Therapy Session Note  Patient Details  Name: Wayne Lee MRN: [id number] Date of Birth: [birth year]  Today's Date: 12/26/2023 OT Individual Time: 0800-0900 OT Individual Time Calculation (min): 60 min    Short Term Goals: Week 1:  OT Short Term Goal 1 (Week 1): Pt will be able to ambulate to bathroom with min A of 1 person. OT Short Term Goal 2 (Week 1): Pt will be able to step over tub wall with CGA and use of grab bars. OT Short Term Goal 3 (Week 1): pt will be able to don socks and shoes with supervision. OT Short Term Goal 4 (Week 1): Pt will be able to recall 3 activities he did in his last therapy session to demonstrate improvement in day to day memory.  Skilled Therapeutic Interventions/Progress Updates:   Pt seen for skilled OT session with focus on Family Education with wife. Pt up in w/c ready for am shower. Wife trained for hands on care throughout am routine. Gait belt use emphasized and wife able to demo amb, transfer and self care assist independently. Pt and wife educated on falls prevention, energy conservation and pacing, pain management and positioning. Issued and trained in [facility] sponge to avoid bending for foot care as pt reports increased Ha pain with effort and bending. OT applied ice to neck mm region for relief. Reinforced UE HEP and left pt bed level with ice and needs with wife bedside.    Pain: meds and ice with 5/10   Therapy Documentation Precautions:  Precautions Precautions: Fall, Other (comment) Precaution Comments: monitor BP, intermittent diplopia, R>L lean/LOB Restrictions Weight Bearing Restrictions Per Provider Order: No   Therapy/Group: Individual Therapy  Vicenta Dunning 12/26/2023, 7:59 AM

## 2023-12-26 NOTE — Progress Notes (Signed)
Physical Therapy Session Note  Patient Details  Name: Wayne Lee MRN: [id number] Date of Birth: April 03, 1976  Today's Date: 12/26/2023 PT Individual Time: 0904-1000 PT Individual Time Calculation (min): 56 min   Today's Date: 12/26/2023 PT Individual Time: 4098-1191 PT Individual Time Calculation (min): 72 min   Short Term Goals: Week 1:  PT Short Term Goal 1 (Week 1): Pt will perform sit<>stands using LRAD with CGA PT Short Term Goal 2 (Week 1): Pt will perform bed<>chair transfers using LRAD with CGA PT Short Term Goal 3 (Week 1): Pt will ambulate at least 131ft using LRAD with CGA PT Short Term Goal 4 (Week 1): Pt will navigate at least 8 stairs using HR with CGA  Skilled Therapeutic Interventions/Progress Updates:      1st Session: Pt received supine in bed and agrees to therapy. Reports pain in head and shoulders. PT provides rest breaks as needed to manage pain. Pt's wife present for family education. Pt performs bed mobility with cues for positioning at EOB. Pt stands with CGA and ambulates x300' with CGA and no noted LOB, with cues for postural stability and navigation. PT has extended discussion with wife and pt regarding concerns for discharge, as well as recommendations for safe mobility following DC. Pt performs ramp navigation with minA and cues for sequencing, then completes car transfer with CGA and cues for positioning and sequencing. Seated rest break. Pt ambulates to main gym with CGA. Pt performs x12 6" steps with bilateral hand rails and CGA, with cues for step sequencing and safety. Following pt completes x8 with wife providing assistance, and then x4 without handrail support to simulate home environment, with wife assisting. Pt ambulates back to room with wife providing CGA. Pt left seated in recliner with all needs within reach.   2nd Session: Pt received seated in recliner and agrees to therapy. Reports headache and shoulder pain. PT provides rest breaks and mobility  to manage pain. Pt stands and ambulates x400' to gym with x1 instance of minA due to slight LOB, but otherwise completing with CGA. PT provides cues for postural stability and navigation. Following, pt performs Berg balance test, and TUG, as detailed below. Pt records following times on TUG:8.4, 7.6, 6.2 seconds. Pt then completes x50' high knee marching to challenge dynamic balance and hip flexor endurance. Pt performs side stepping x50' with cues for correct performance and utilization of upright gaze to improve balance. Activity progressed by having pt perform x80' sidestepping, with incorporation of 180 degree turns every 10'. Pt requires minA for several slight LOBs. Pt takes supine rest break. Pt then completes x80' ambulation with horizontal head turns every 10', then x80' with vertical head turns every 10'. PT provides CGA/minA throughout. Following rest break, pt ambulates back to room with same assistance and cues. Left supine in bed with all needs within reach.    Therapy Documentation Precautions:  Precautions Precautions: Fall, Other (comment) Precaution Comments: monitor BP, intermittent diplopia, R>L lean/LOB Restrictions Weight Bearing Restrictions Per Provider Order: No  Balance: Balance Balance Assessed: Yes Standardized Balance Assessment Standardized Balance Assessment: Berg Balance Test Berg Balance Test Sit to Stand: Able to stand  independently using hands Standing Unsupported: Able to stand safely 2 minutes Sitting with Back Unsupported but Feet Supported on Floor or Stool: Able to sit safely and securely 2 minutes Stand to Sit: Controls descent by using hands Transfers: Able to transfer safely, minor use of hands Standing Unsupported with Eyes Closed: Able to stand 10 seconds with supervision  Standing Ubsupported with Feet Together: Able to place feet together independently and stand for 1 minute with supervision From Standing, Reach Forward with Outstretched Arm: Can  reach confidently >25 cm (10") From Standing Position, Pick up Object from Floor: Able to pick up shoe, needs supervision From Standing Position, Turn to Look Behind Over each Shoulder: Looks behind from both sides and weight shifts well Turn 360 Degrees: Able to turn 360 degrees safely but slowly Standing Unsupported, Alternately Place Feet on Step/Stool: Able to stand independently and safely and complete 8 steps in 20 seconds Standing Unsupported, One Foot in Front: Loses balance while stepping or standing Standing on One Leg: Unable to try or needs assist to prevent fall Total Score: 41    Therapy/Group: Individual Therapy  Beau Fanny, PT, DPT 12/26/2023, 4:13 PM

## 2023-12-26 NOTE — Progress Notes (Signed)
PROGRESS NOTE   Subjective/Complaints: No events overnight.  No acute complaints. Headache and neck pain are improving, do his feel that the current scheduling of medications is keeping things at a good level. No further sleep disruptions.  ROS: Patient denies fever, new vision changes, dizziness, nausea, vomiting, diarrhea,  shortness of breath or chest pain, joint pain or mood change.  + Local neck/shoulder pain -ongoing, improved  Objective:   No results found. Recent Labs    12/26/23 0626  WBC 5.1  HGB 13.6  HCT 40.3  PLT 322   Recent Labs    12/26/23 0626  NA 134*  K 3.5  CL 99  CO2 25  GLUCOSE 115*  BUN 15  CREATININE 1.11  CALCIUM 9.3     Intake/Output Summary (Last 24 hours) at 12/26/2023 2024 Last data filed at 12/26/2023 1820 Gross per 24 hour  Intake 356 ml  Output --  Net 356 ml        Physical Exam: Vital Signs Blood pressure 108/65, pulse 84, temperature 99.6 F (37.6 C), temperature source Oral, resp. rate 17, height 5\' 8"  (1.727 m), weight 110.1 kg, SpO2 98%.   General: No apparent distress. Laying in bedside chair under a blanket.  HEENT: Occiput incision C/D/I with skin glue in place.  Area of fluctuant edema coming down.   + Mild nystagmus on leftward gaze Mild tenderness to palpation through neck, shoulders--improving Heart: Reg rate and rhythm.  No murmurs, rubs, or gallops.  Peripheral pulses intact. Chest: CTA bilaterally without wheezes, rales, or rhonchi; no distress Abdomen: Soft, non-tender, non-distended, bowel sounds positive. Psych: Pt's affect is appropriate. Pt is cooperative  Skin: Surgical site dermabonded, healing well.  No rash appreciated through back and shoulders.  Neuro:  Alert and awake.  Intact Memory. Normal language and speech. Cranial nerve 2-12 grossly intact.  Experiences diplopia to all fields, 2-3 beats of nystagmus with gaze left--this is improving,  and somewhat subtle on exam now MMT: RUE and RLE 5/5. LUE and LLE 5-/5.  Sensory exam normal for light touch and pain in all 4 limbs.  No appreciable ataxia on exam; prior left upper extremity ataxia   Assessment/Plan: 1. Functional deficits which require 3+ hours per day of interdisciplinary therapy in a comprehensive inpatient rehab setting. Physiatrist is providing close team supervision and 24 hour management of active medical problems listed below. Physiatrist and rehab team continue to assess barriers to discharge/monitor patient progress toward functional and medical goals  Care Tool:  Bathing    Body parts bathed by patient: Right arm, Left arm, Chest, Abdomen, Front perineal area, Buttocks, Right upper leg, Face, Left upper leg   Body parts bathed by helper: Right lower leg, Left lower leg     Bathing assist Assist Level: Minimal Assistance - Patient > 75%     Upper Body Dressing/Undressing Upper body dressing   What is the patient wearing?: Pull over shirt    Upper body assist Assist Level: Supervision/Verbal cueing    Lower Body Dressing/Undressing Lower body dressing      What is the patient wearing?: Pants     Lower body assist Assist for lower body dressing: Contact  Guard/Touching assist     Editor, commissioning assist Assist for toileting: Minimal Assistance - Patient > 75%     Transfers Chair/bed transfer  Transfers assist     Chair/bed transfer assist level: Minimal Assistance - Patient > 75%     Locomotion Ambulation   Ambulation assist      Assist level: Moderate Assistance - Patient 50 - 74% Assistive device: No Device Max distance: 159ft   Walk 10 feet activity   Assist     Assist level: Moderate Assistance - Patient - 50 - 74% Assistive device: No Device   Walk 50 feet activity   Assist    Assist level: Moderate Assistance - Patient - 50 - 74% Assistive device: No Device    Walk 150 feet  activity   Assist    Assist level: Moderate Assistance - Patient - 50 - 74% Assistive device: No Device    Walk 10 feet on uneven surface  activity   Assist     Assist level: Maximal Assistance - Patient 25 - 49% (on mulch)     Wheelchair     Assist Is the patient using a wheelchair?: No             Wheelchair 50 feet with 2 turns activity    Assist            Wheelchair 150 feet activity     Assist          Blood pressure 108/65, pulse 84, temperature 99.6 F (37.6 C), temperature source Oral, resp. rate 17, height 5\' 8"  (1.727 m), weight 110.1 kg, SpO2 98%.   Medical Problem List and Plan: 1. 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 - 12/31/23  -Continue CIR   - 1/14: Wife very supportive. Limited by pain in traps and neck per OT, supervision with UBD and Min A with transfer/toiletting. PT CGA transfers and Min-Mod for ambulation without AD, limited by balance deficits and endurance issues. At cognitive baseline per family, SLP recommending intermittent supervision but no new goals.   2.  Antithrombotics: -DVT/anticoagulation:  Pharmaceutical: Heparin added on 12/20/23             -antiplatelet therapy: N/A  3. Pain Management: tylenol prn.               --see below  -1-13: Increased muscle pain/soreness for patient.  Increase tramadol to 50 to 100 mg, add Robaxin 750 mg 3 times daily as needed, and add muscle rub Bengay twice daily for soreness.  1-14: Local pain on posterior head, asked nursing to apply ice to this area.  Otherwise, pain better controlled with current regimen.  1-15: Pain well-controlled on current regimen but having trouble staying ahead of pain - scheduled tramadol 50 mg TID and reduce PRN to 50 mg Q6H.  1-16: Pain stable/improved with scheduled as needed tramadol.  Continue this  regimen.   4. Mood/Behavior/Sleep: LCSW to follow for evaluation and support.              --Gabapentin 300 mg/hs to assist with sleep/HA and hypnagogic hallucinations             -- antipsychotic agents: N/A  -- 1/11 Insomnia last night, discussed can  try trazodone PRN  -- 1/12 at at bedtime melatonin--does not seem to have made a difference, problem is mostly due to general internal clock having him go to bed at 5 AM, patient is working on strategies to stay awake during the day to improve his sleep schedule here at night.  Will continue current measures, but do not feel escalation of these will be of much benefit without sedating patient.  1-14: Patient slept much better overnight with staying up during the day; continue medications for 1 more day, then start to reduce if sleep remains adequate  1-15: Not requiring trazodone as needed, continue melatonin scheduled - remains improved overall-insomnia resolved  5. Neuropsych/cognition: This patient is capable of making decisions on his own behalf. 6. Skin/Wound Care: Monitor incision for healing - healing well.  7. Fluids/Electrolytes/Nutrition: Monitor I/O. Check CMET in am  -Labs, vital stable 1-16.  Eating well.  No concerns  8. HTN: BP has been well controlled PTA--will monitor BP TID --hydralazine prn added. - BP stable, monitor     12/26/2023    8:00 PM 12/26/2023    2:45 PM 12/26/2023    4:26 AM  Vitals with BMI  Systolic 108 130 914  Diastolic 65 90 59  Pulse 84 83 63    9. Persistent HA: related to bleed/post-op --Add Topamax 25 mg bid as well as Gabapentin 100 mg bid/300 mg/hs as above -add tramadol for breakthrough headache. D/c fioricet.  -1/12 intermittent use of tramadol, will schedule Tylenol 4 times daily 650 mg No further complaints of headache  10. Vestibular sx: - Has had dizziness and diplopia--meclizine prn added --has dark glasses to help with light sensitivity.  -recover will be more about acclimation and  tolerance - May benefit from vestibular therapy, will discuss with therapy teams this week; none recommended per PT/OT, domes seem to be self resolving 1/15: Used as needed meclizine yesterday for dizziness, continue PRN   11. Constipation: Increase Senna to 2 tabs bid. Continue Miralax daily. 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    -May benefit from oxygen study prior to discharge from rehab.  13. ABLA: recheck CBC in am.   -1/11 HGB up to 14.4; 1-13 stable at 14  14. Low grade temp:             -wound looks clean, lungs are clear             -encourage IS             -monitor clinically for now  -1/12 Tmax 100.3 last night, no signs of infection noted, recheck labs tomorrow morning  1-13: WBC remained stable, last fever recorded 1-11  LOS: 6 days A FACE TO FACE EVALUATION WAS PERFORMED  Angelina Sheriff 12/26/2023, 8:24 PM

## 2023-12-26 NOTE — Plan of Care (Signed)
  Problem: Consults Goal: RH BRAIN INJURY PATIENT EDUCATION Description: Description: See Patient Education module for eduction specifics Outcome: Progressing   Problem: RH BOWEL ELIMINATION Goal: RH STG MANAGE BOWEL WITH ASSISTANCE Description: STG Manage Bowel with toileting Assistance. Outcome: Progressing Goal: RH STG MANAGE BOWEL W/MEDICATION W/ASSISTANCE Description: STG Manage Bowel with Medication with mod I  Assistance. Outcome: Progressing   Problem: RH SAFETY Goal: RH STG ADHERE TO SAFETY PRECAUTIONS W/ASSISTANCE/DEVICE Description: STG Adhere to Safety Precautions With cues Assistance/Device. Outcome: Progressing   Problem: RH COGNITION-NURSING Goal: RH STG USES MEMORY AIDS/STRATEGIES W/ASSIST TO PROBLEM SOLVE Description: STG Uses Memory Aids/Strategies With cues Assistance to Problem Solve. Outcome: Progressing   Problem: RH PAIN MANAGEMENT Goal: RH STG PAIN MANAGED AT OR BELOW PT'S PAIN GOAL Description: < 4 with prns Outcome: Progressing   Problem: RH KNOWLEDGE DEFICIT BRAIN INJURY Goal: RH STG INCREASE KNOWLEDGE OF SELF CARE AFTER BRAIN INJURY Description: Patient and wife will be able to manage care at discharge using educational resources  for medication and diet independently Outcome: Progressing

## 2023-12-27 DIAGNOSIS — I614 Nontraumatic intracerebral hemorrhage in cerebellum: Secondary | ICD-10-CM | POA: Diagnosis not present

## 2023-12-27 MED ORDER — TRAMADOL HCL 50 MG PO TABS
50.0000 mg | ORAL_TABLET | Freq: Once | ORAL | Status: AC
  Administered 2023-12-27: 50 mg via ORAL
  Filled 2023-12-27: qty 1

## 2023-12-27 MED ORDER — TOPIRAMATE 25 MG PO TABS
50.0000 mg | ORAL_TABLET | Freq: Two times a day (BID) | ORAL | Status: DC
  Administered 2023-12-27 – 2023-12-31 (×8): 50 mg via ORAL
  Filled 2023-12-27 (×8): qty 2

## 2023-12-27 NOTE — Progress Notes (Signed)
Physical Therapy Session Note  Patient Details  Name: Wayne Lee MRN: [id number] Date of Birth: [birth year]  Today's Date: 12/27/2023 PT Individual Time: 0805-0830 PT Individual Time Calculation (min): 25 min   Today's Date: 12/27/2023 PT Individual Time: 4098-1191 PT Individual Time Calculation (min): 72 min  and Today's Date: 12/27/2023 PT Missed Time: 45 Minutes Missed Time Reason: Patient fatigue  Short Term Goals: Week 1:  PT Short Term Goal 1 (Week 1): Pt will perform sit<>stands using LRAD with CGA PT Short Term Goal 2 (Week 1): Pt will perform bed<>chair transfers using LRAD with CGA PT Short Term Goal 3 (Week 1): Pt will ambulate at least 114ft using LRAD with CGA PT Short Term Goal 4 (Week 1): Pt will navigate at least 8 stairs using HR with CGA  Skilled Therapeutic Interventions/Progress Updates:   1st Session: Pt received seated in [facility] and agrees to therapy. Reports pain in head and neck and shoulders. PT provides rest breaks and gentle mobility to manage pain. Pt performs sit to stand with cues for initiation. Pt ambulates to gym with very close supervision with cues for trunk rotation and increasing gait speed and stride length to decrease risk for falls. Pt completes Nustep for endurance training, as well as providing gentle ROM for BUEs. PT provides cues to allow arms to relax on handles and to perform majority of work with lower extremities. PT also provides cues for hand nad foot placement and completing full available ROM. Pt completes x10:00 at workload of 6 with average steps per minute ~50. Pt ambulates back to room with close supervision and similar cues. Left seated in recliner with alarm intact and all needs within reach.   2nd Session: Pt received supine in bed and agrees to therapy. Reports pain in head and neck and shoulders. RN alerted during session and provides pt with pain meds. PT provides rest breaks as needed to manage pain.   Pt ambulates following on  treadmill. 3:00 at 1.0 mph for 262'. Pt ambulates forward x1:30, Rt sidestepping 30 seconds, Lt sidestepping 30 seconds, and backward gait x30 seconds. Following extended seated rest break, pt completes additional bout of x3:00, incorporating more frequent turns with sidestepping in both directions, backward ambulation, and forward ambulation. Pt tasked with transitioning without losing balance and is able to do so ~50% of time. Pt verbalizes significant fatigue following activity and requires extended rest break in sidelying on mat. Pt then completes 3x10 bridges in hooklying with cues for body mechanics and correct performance. Pt then perform flutter kick with feet 6" above mat to engage core and promote improved coordination. Pt completes 3x10 with rest breaks. Pt returns to sitting with cues for positioning. Pt ambulates back to room with minA due to slight LOB when ambulating through doorway of room. Pt left supine in bed with alarm intact and all needs within reach.   3rd Session: Pt received seated in recliner and politely declines therapy due to fatigue. PT will follow up as able.    Therapy Documentation Precautions:  Precautions Precautions: Fall, Other (comment) Precaution Comments: monitor BP, intermittent diplopia, R>L lean/LOB Restrictions Weight Bearing Restrictions Per Provider Order: No   Therapy/Group: Individual Therapy  Beau Fanny, PT, DPT 12/27/2023, 4:02 PM

## 2023-12-27 NOTE — Progress Notes (Signed)
Occupational Therapy Session Note  Patient Details  Name: Wren Merges MRN: [id number] Date of Birth: [birth year]  Today's Date: 12/27/2023 OT Individual Time: 7829-5621 OT Individual Time Calculation (min): 39 min    Short Term Goals: Week 1:  OT Short Term Goal 1 (Week 1): Pt will be able to ambulate to bathroom with min A of 1 person. OT Short Term Goal 2 (Week 1): Pt will be able to step over tub wall with CGA and use of grab bars. OT Short Term Goal 3 (Week 1): pt will be able to don socks and shoes with supervision. OT Short Term Goal 4 (Week 1): Pt will be able to recall 3 activities he did in his last therapy session to demonstrate improvement in day to day memory.  Skilled Therapeutic Interventions/Progress Updates:    Pt received supine with 7/10 pain- HA and neck. Agreeable to OT session. Mother present. He completed bed mobility to EOB with (S). He donned shirt and shoes with (S). Pt completed 100 ft of functional mobility with no device with min A, R sway. He completed standing level coordination activity with isolated single leg stance to improve dynamic standing balance. He required min A overall for balance and min cueing for stance to increase BOS. He then completed obstacle course style dynamic balance activity, stepping over two obstacles and then onto an airex pad to challenge ankle and hip righting reactions for reduced fall risk. He then completed 1 min trials on the Kinetron in standing to challenge posterior chain strengthening and improve dynamic standing balance with large weightshift R/L to improve independence with ADL transfers. 3x1 min completed with long rest break between 2/2 fatigue. He completed functional mobility back to his room following with CGA. He was left sitting up in the recliner with all needs met.   Therapy Documentation Precautions:  Precautions Precautions: Fall, Other (comment) Precaution Comments: monitor BP, intermittent diplopia, R>L  lean/LOB Restrictions Weight Bearing Restrictions Per Provider Order: No  Therapy/Group: Individual Therapy  Crissie Reese 12/27/2023, 2:20 PM

## 2023-12-27 NOTE — Progress Notes (Signed)
PROGRESS NOTE   Subjective/Complaints: No events overnight.  No acute complaints. Patient states that headache still comes and goes intermittently, but is decreasing severity, at its worst 7 out of 10.  He is not consistently using PRNs, reminded him to do this if needed. No other complaints.   Vital stable, labs stable, last bowel movement 1-15, continent of bowel bladder  ROS: Patient denies fever, new vision changes, dizziness, nausea, vomiting, diarrhea,  shortness of breath or chest pain, joint pain or mood change.  + Local neck/shoulder pain -ongoing, improved  + Headache-intermittent, ongoing  Objective:   No results found. Recent Labs    12/26/23 0626  WBC 5.1  HGB 13.6  HCT 40.3  PLT 322   Recent Labs    12/26/23 0626  NA 134*  K 3.5  CL 99  CO2 25  GLUCOSE 115*  BUN 15  CREATININE 1.11  CALCIUM 9.3     Intake/Output Summary (Last 24 hours) at 12/27/2023 0839 Last data filed at 12/26/2023 2105 Gross per 24 hour  Intake 416 ml  Output --  Net 416 ml        Physical Exam: Vital Signs Blood pressure 109/62, pulse 77, temperature 98.6 F (37 C), temperature source Oral, resp. rate 17, height 5\' 8"  (1.727 m), weight 110.1 kg, SpO2 95%.   General: No apparent distress.  Laying in bed. HEENT: Occiput incision C/D/I with skin glue in place.  Area of fluctuant edema continues to come down. + Mild nystagmus on leftward gaze--unchanged. Mild tenderness to palpation through neck, shoulders--improving Heart: Reg rate and rhythm.  No murmurs, rubs, or gallops.  Peripheral pulses intact. Chest: CTA bilaterally without wheezes, rales, or rhonchi; no distress Abdomen: Soft, non-tender, non-distended, bowel sounds positive. Psych: Pt's affect is appropriate. Pt is cooperative  Skin: Surgical site dermabonded, healing well.   Neuro:  Alert and awake.  Intact Memory. Normal language and speech. Cranial nerve  2-12 grossly intact.  Ongoing headache with EOMI, diplopia in all fields.  Improving. MMT: RUE and RLE 5/5. LUE and LLE 5-/5. --Unchanged 1-17 Sensory exam normal for light touch and pain in all 4 limbs.  No appreciable ataxia on exam; prior left upper extremity ataxia   Assessment/Plan: 1. Functional deficits which require 3+ hours per day of interdisciplinary therapy in a comprehensive inpatient rehab setting. Physiatrist is providing close team supervision and 24 hour management of active medical problems listed below. Physiatrist and rehab team continue to assess barriers to discharge/monitor patient progress toward functional and medical goals  Care Tool:  Bathing    Body parts bathed by patient: Right arm, Left arm, Chest, Abdomen, Front perineal area, Buttocks, Right upper leg, Face, Left upper leg   Body parts bathed by helper: Right lower leg, Left lower leg     Bathing assist Assist Level: Minimal Assistance - Patient > 75%     Upper Body Dressing/Undressing Upper body dressing   What is the patient wearing?: Pull over shirt    Upper body assist Assist Level: Supervision/Verbal cueing    Lower Body Dressing/Undressing Lower body dressing      What is the patient wearing?: Pants  Lower body assist Assist for lower body dressing: Contact Guard/Touching assist     Toileting Toileting    Toileting assist Assist for toileting: Minimal Assistance - Patient > 75%     Transfers Chair/bed transfer  Transfers assist     Chair/bed transfer assist level: Minimal Assistance - Patient > 75%     Locomotion Ambulation   Ambulation assist      Assist level: Moderate Assistance - Patient 50 - 74% Assistive device: No Device Max distance: 144ft   Walk 10 feet activity   Assist     Assist level: Moderate Assistance - Patient - 50 - 74% Assistive device: No Device   Walk 50 feet activity   Assist    Assist level: Moderate Assistance - Patient -  50 - 74% Assistive device: No Device    Walk 150 feet activity   Assist    Assist level: Moderate Assistance - Patient - 50 - 74% Assistive device: No Device    Walk 10 feet on uneven surface  activity   Assist     Assist level: Maximal Assistance - Patient 25 - 49% (on mulch)     Wheelchair     Assist Is the patient using a wheelchair?: No             Wheelchair 50 feet with 2 turns activity    Assist            Wheelchair 150 feet activity     Assist          Blood pressure 109/62, pulse 77, temperature 98.6 F (37 C), temperature source Oral, resp. rate 17, height 5\' 8"  (1.727 m), weight 110.1 kg, SpO2 95%.   Medical Problem List and Plan: 1. 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 - 12/31/23  -Continue CIR   - 1/14: Wife very supportive. Limited by pain in traps and neck per OT, supervision with UBD and Min A with transfer/toiletting. PT CGA transfers and Min-Mod for ambulation without AD, limited by balance deficits and endurance issues. At cognitive baseline per family, SLP recommending intermittent supervision but no new goals.   2.  Antithrombotics: -DVT/anticoagulation:  Pharmaceutical: Heparin added on 12/20/23             -antiplatelet therapy: N/A  3. Pain Management: tylenol prn.               --see below  -1-13: Increased muscle pain/soreness for patient.  Increase tramadol to 50 to 100 mg, add Robaxin 750 mg 3 times daily as needed, and add muscle rub Bengay twice daily for soreness.  1-14: Local pain on posterior head, asked nursing to apply ice to this area.  Otherwise, pain better controlled with current regimen.  1-15: Pain well-controlled on current regimen but having trouble staying ahead of pain - scheduled tramadol 50 mg TID and reduce PRN to 50 mg Q6H.  1-16: Pain  stable/improved with scheduled as needed tramadol.  Continue this regimen.  1-17: Increase Topamax to 50 mg twice daily.  Reminded patient to use as needed tramadol if needed.   4. Mood/Behavior/Sleep: LCSW to follow for evaluation and support.              --Gabapentin 300 mg/hs to assist with  sleep/HA and hypnagogic hallucinations             -- antipsychotic agents: N/A  -- 1/11 Insomnia last night, discussed can try trazodone PRN  -- 1/12 at at bedtime melatonin--does not seem to have made a difference, problem is mostly due to general internal clock having him go to bed at 5 AM, patient is working on strategies to stay awake during the day to improve his sleep schedule here at night.  Will continue current measures, but do not feel escalation of these will be of much benefit without sedating patient.  1-14: Patient slept much better overnight with staying up during the day; continue medications for 1 more day, then start to reduce if sleep remains adequate  1-15: Not requiring trazodone as needed, continue melatonin scheduled - remains improved overall-insomnia resolved; DC trazodone on discharge  5. Neuropsych/cognition: This patient is capable of making decisions on his own behalf. 6. Skin/Wound Care: Monitor incision for healing - healing well.  7. Fluids/Electrolytes/Nutrition: Monitor I/O. Check CMET in am  -Labs, vital stable 1-16.  Eating well.  No concerns  8. HTN: BP has been well controlled PTA--will monitor BP TID --hydralazine prn added. - BP stable, monitor     12/27/2023    5:41 AM 12/26/2023    8:00 PM 12/26/2023    2:45 PM  Vitals with BMI  Systolic 109 108 528  Diastolic 62 65 90  Pulse 77 84 83    9. Persistent HA: related to bleed/post-op --Add Topamax 25 mg bid as well as Gabapentin 100 mg bid/300 mg/hs as above -add tramadol for breakthrough headache. D/c fioricet.  -1/12 intermittent use of tramadol, will schedule Tylenol 4 times daily 650 mg No further  complaints of headache  10. Vestibular sx: - Has had dizziness and diplopia--meclizine prn added --has dark glasses to help with light sensitivity.  -recover will be more about acclimation and tolerance - May benefit from vestibular therapy, will discuss with therapy teams this week; none recommended per PT/OT, domes seem to be self resolving 1/15: Used as needed meclizine yesterday for dizziness, continue PRN   -No further use  11. Constipation: Increase Senna to 2 tabs bid. Continue Miralax daily. 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    -May benefit from oxygen study prior to discharge from rehab.  13. ABLA: recheck CBC in am.   -1/11 HGB up to 14.4; 1-13 stable at 14  14. Low grade temp:             -wound looks clean, lungs are clear             -encourage IS             -monitor clinically for now  -1/12 Tmax 100.3 last night, no signs of infection noted, recheck labs tomorrow morning  1-13: WBC remained stable, last fever recorded 1-11  LOS: 7 days A FACE TO FACE EVALUATION WAS PERFORMED  Angelina Sheriff 12/27/2023, 8:39 AM

## 2023-12-27 NOTE — Plan of Care (Signed)
  Problem: Consults Goal: RH BRAIN INJURY PATIENT EDUCATION Description: Description: See Patient Education module for eduction specifics Outcome: Progressing   Problem: RH SAFETY Goal: RH STG ADHERE TO SAFETY PRECAUTIONS W/ASSISTANCE/DEVICE Description: STG Adhere to Safety Precautions With cues Assistance/Device. Outcome: Progressing   Problem: RH PAIN MANAGEMENT Goal: RH STG PAIN MANAGED AT OR BELOW PT'S PAIN GOAL Description: < 4 with prns Outcome: Progressing

## 2023-12-27 NOTE — Progress Notes (Addendum)
Patient ID: Wayne Lee, male   DOB: 1976-09-24, 48 y.o.   MRN: [id number]  SW received FMLA forms for pt wife. SW gave forms to [employer] and will provide forms once completed.   Cecile Sheerer, MSW, LCSW Office: [phone] Cell: [phone] Fax: [phone]

## 2023-12-28 DIAGNOSIS — I619 Nontraumatic intracerebral hemorrhage, unspecified: Secondary | ICD-10-CM | POA: Diagnosis not present

## 2023-12-28 NOTE — Progress Notes (Signed)
Occupational Therapy Session Note  Patient Details  Name: Wayne Lee MRN: [id number] Date of Birth: 01-14-1976  Today's Date: 12/28/2023 OT Individual Time: 6440-3474 OT Individual Time Calculation (min): 45 min    Short Term Goals: Week 1:  OT Short Term Goal 1 (Week 1): Pt will be able to ambulate to bathroom with min A of 1 person. OT Short Term Goal 2 (Week 1): Pt will be able to step over tub wall with CGA and use of grab bars. OT Short Term Goal 3 (Week 1): pt will be able to don socks and shoes with supervision. OT Short Term Goal 4 (Week 1): Pt will be able to recall 3 activities he did in his last therapy session to demonstrate improvement in day to day memory.  Skilled Therapeutic Interventions/Progress Updates:     Patient resting in bed at the time of arrival, patient indicated that he wasn't able to rest well on last night secondary to being restless. The pt reported a headache of 7 on 0-10 with nursing being aware and providing medication to address his pain. Patient able to transfer from supine to EOB with MinA using the bed rails. Patient able to sit EOB unsupported. The pt was able to come from sit to stand with CGA and he was able to ambulate to the restroom with  CGA with the gait belt  in place. The pt was transported to his living quarters and was able to make his bed while standing with closeS. The pt was transported to the shower at w/c  LOF, he was able to transfer from the w/c to the shower with closeS  using the grab bars.  The pt was able to doff his LB garments, underwear and shorts with close S. The pt was able to bathe his UB/LB with close S. The pt was able to exit the shower for placement in the w/c and was able to donn his underwear with s/u A.  The pt was transported to the sink area and was able to complete grooming task involving brushing his teeth and applying lotion with s/uA.  At the end of the session, the pt hadn't completed dressing UB/LB so nursing was  made aware and the  NT  was asked to observe the pt as a safety measure.  Therapy Documentation Precautions:  Precautions Precautions: Fall, Other (comment) Precaution Comments: monitor BP, intermittent diplopia, R>L lean/LOB Restrictions Weight Bearing Restrictions Per Provider Order: No Therapy/Group: Individual Therapy  L[place]ona Mound 12/28/2023, 10:02 AM

## 2023-12-28 NOTE — Progress Notes (Signed)
Physical Therapy Session Note  Patient Details  Name: Wayne Lee MRN: [id number] Date of Birth: Apr 25, 1976  Today's Date: 12/28/2023 PT Individual Time: 1020-1117 PT Individual Time Calculation (min): 57 min   Short Term Goals: Week 1:  PT Short Term Goal 1 (Week 1): Pt will perform sit<>stands using LRAD with CGA PT Short Term Goal 2 (Week 1): Pt will perform bed<>chair transfers using LRAD with CGA PT Short Term Goal 3 (Week 1): Pt will ambulate at least 159ft using LRAD with CGA PT Short Term Goal 4 (Week 1): Pt will navigate at least 8 stairs using HR with CGA  Skilled Therapeutic Interventions/Progress Updates: Patient sitting in recliner with friend present on entrance to room. Patient alert and agreeable to PT session.   Patient reported 7/10 pain due to to HA (premedicated). Beginning focused on building pt rapport with pt's personal goals for therapy by d/c and present day. Pt reported wanting to get better at being able to do things with less assistance at home.  Therapeutic Activity: Transfers: Pt performed sit<>stand transfers throughout session with no AD and with close supervision.  Neuromuscular Re-ed: NMR facilitated during session with focus on dynamic balance, endurance and safety awareness while ambulating during functional tasks, and dual-tasking  - Ambulating around nsg/day room loop x 1 with VC to walk and turn how pt has been doing to assess balance with turns to the L. Pt with increased cadence and unsteadiness that increased when turning to the L. Pt with CGA/very light minA for safety.  - Ambulating around nsg/day room loop x 1 (turns to the L) with VC to locate vertical lines to assist with orientation to upright positioning, and to decrease cadence. Pt then with seated rest break and ambulated to the R around same loop.   - Pt with CGA throughout with slightly notable decrease in imbalance, but still with unsteadiness. Pt reported it assists with having to  decrease cadence, which helps with balance. Obstacle Course             1st - Pinch pins on basket ball net with 2 of same color (4 sets) to act as "ticket" to acquire cone on step 2.             2nd - Cones on table of same sets of colors matching pinching pins             3rd - Table with matching colored discs (one of each color)             4th - Pt to stack disc on big base of cone with other big base stacked on top to balance and to bring back to table by same colored pins on step 3. Pt to do course above while having to navigate around bowling pins on the floor. Pt performed with CGA throughout and was able to recall sequence (one min cue to bring stacked cones to table on step 3. Pt performed task to simulate having to do tasks at home in order while having to navigate potential items of the floor (dog toys, kid toys etc. - pt educated to have family ensure that all tripping hazards are cleaned up).  - Pt standing in one position and shooting small basketball to basketball goal. Pt performed with CGA, and also instructed to ambulate short distance to grab rebound ball (as long as it is not too low as to avoid increasing intracranial pressure). Pt demonstrated B coordination of UE to grab ball,  retro-stepping and lateral stepping with decent accuracy of shooting ball into goal. Pt also demonstrated reaching out of BOS to grab ball with some mild unsteadiness presenting, but no true LOB   NMR performed for improvements in motor control and coordination, balance, sequencing, judgement, and self confidence/ efficacy in performing all aspects of mobility at highest level of independence.   Patient sitting in recliner at end of session with brakes locked, and all needs within reach.      Therapy Documentation Precautions:  Precautions Precautions: Fall, Other (comment) Precaution Comments: monitor BP, intermittent diplopia, R>L lean/LOB Restrictions Weight Bearing Restrictions Per Provider  Order: No   Therapy/Group: Individual Therapy  [employer] PTA 12/28/2023, 11:45 AM

## 2023-12-28 NOTE — Progress Notes (Signed)
PROGRESS NOTE   Subjective/Complaints: No new complaints this morning "[name]" Patient's chart reviewed- No issues reported overnight Vitals signs stable   ROS: Patient denies fever, new vision changes, dizziness, nausea, vomiting, diarrhea,  shortness of breath or chest pain, joint pain or mood change.  + Local neck/shoulder pain -ongoing, improved  + Headache-intermittent, ongoing  Objective:   No results found. Recent Labs    12/26/23 0626  WBC 5.1  HGB 13.6  HCT 40.3  PLT 322   Recent Labs    12/26/23 0626  NA 134*  K 3.5  CL 99  CO2 25  GLUCOSE 115*  BUN 15  CREATININE 1.11  CALCIUM 9.3     Intake/Output Summary (Last 24 hours) at 12/28/2023 1233 Last data filed at 12/28/2023 1000 Gross per 24 hour  Intake 120 ml  Output --  Net 120 ml        Physical Exam: Vital Signs Blood pressure 99/60, pulse 77, temperature 98.3 F (36.8 C), resp. rate 17, height 5\' 8"  (1.727 m), weight 110.1 kg, SpO2 100%.   General: No apparent distress.  Laying in bed. HEENT: Occiput incision C/D/I with skin glue in place.  Area of fluctuant edema continues to come down. + Mild nystagmus on leftward gaze--unchanged. Mild tenderness to palpation through neck, shoulders--improving Heart: Reg rate and rhythm.  No murmurs, rubs, or gallops.  Peripheral pulses intact. Chest: CTA bilaterally without wheezes, rales, or rhonchi; no distress Abdomen: Soft, non-tender, non-distended, bowel sounds positive. Psych: Pt's affect is appropriate. Pt is cooperative  Skin: Surgical site dermabonded, healing well.   Neuro:  Alert and awake.  Intact Memory. Normal language and speech. Cranial nerve 2-12 grossly intact.  Ongoing headache with EOMI, diplopia in all fields.  Improving. MMT: RUE and RLE 5/5. LUE and LLE 5-/5. Stable 1/18 Sensory exam normal for light touch and pain in all 4 limbs.  No appreciable ataxia on exam; prior left  upper extremity ataxia   Assessment/Plan: 1. Functional deficits which require 3+ hours per day of interdisciplinary therapy in a comprehensive inpatient rehab setting. Physiatrist is providing close team supervision and 24 hour management of active medical problems listed below. Physiatrist and rehab team continue to assess barriers to discharge/monitor patient progress toward functional and medical goals  Care Tool:  Bathing    Body parts bathed by patient: Right arm, Left arm, Chest, Abdomen, Front perineal area, Buttocks, Right upper leg, Face, Left upper leg   Body parts bathed by helper: Right lower leg, Left lower leg     Bathing assist Assist Level: Minimal Assistance - Patient > 75%     Upper Body Dressing/Undressing Upper body dressing   What is the patient wearing?: Pull over shirt    Upper body assist Assist Level: Supervision/Verbal cueing    Lower Body Dressing/Undressing Lower body dressing      What is the patient wearing?: Pants     Lower body assist Assist for lower body dressing: Contact Guard/Touching assist     Toileting Toileting    Toileting assist Assist for toileting: Minimal Assistance - Patient > 75%     Transfers Chair/bed transfer  Transfers assist  Chair/bed transfer assist level: Minimal Assistance - Patient > 75%     Locomotion Ambulation   Ambulation assist      Assist level: Moderate Assistance - Patient 50 - 74% Assistive device: No Device Max distance: 141ft   Walk 10 feet activity   Assist     Assist level: Moderate Assistance - Patient - 50 - 74% Assistive device: No Device   Walk 50 feet activity   Assist    Assist level: Moderate Assistance - Patient - 50 - 74% Assistive device: No Device    Walk 150 feet activity   Assist    Assist level: Moderate Assistance - Patient - 50 - 74% Assistive device: No Device    Walk 10 feet on uneven surface  activity   Assist     Assist level:  Maximal Assistance - Patient 25 - 49% (on mulch)     Wheelchair     Assist Is the patient using a wheelchair?: No             Wheelchair 50 feet with 2 turns activity    Assist            Wheelchair 150 feet activity     Assist          Blood pressure 99/60, pulse 77, temperature 98.3 F (36.8 C), resp. rate 17, height 5\' 8"  (1.727 m), weight 110.1 kg, SpO2 100%.   Medical Problem List and Plan: 1. 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 - 12/31/23  -Continue CIR   - 1/14: Wife very supportive. Limited by pain in traps and neck per OT, supervision with UBD and Min A with transfer/toiletting. PT CGA transfers and Min-Mod for ambulation without AD, limited by balance deficits and endurance issues. At cognitive baseline per family, SLP recommending intermittent supervision but no new goals.   2.  Antithrombotics: -DVT/anticoagulation:  Pharmaceutical: Heparin added on 12/20/23             -antiplatelet therapy: N/A  3. Pain Management: tylenol prn.               --see below  -1-13: Increased muscle pain/soreness for patient.  Increase tramadol to 50 to 100 mg, add Robaxin 750 mg 3 times daily as needed, and add muscle rub Bengay twice daily for soreness.  1-14: Local pain on posterior head, asked nursing to apply ice to this area.  Otherwise, pain better controlled with current regimen.  1-15: Pain well-controlled on current regimen but having trouble staying ahead of pain - scheduled tramadol 50 mg TID and reduce PRN to 50 mg Q6H.  1-16: Pain stable/improved with scheduled as needed tramadol.  Continue this regimen.  1-17: Increase Topamax to 50 mg twice daily.  Reminded patient to use as needed tramadol if needed.   4. Mood/Behavior/Sleep: LCSW to follow for evaluation and support.              --Gabapentin 300  mg/hs to assist with sleep/HA and hypnagogic hallucinations             -- antipsychotic agents: N/A  -- 1/11 Insomnia last night, discussed can try trazodone PRN  -- 1/12 at at bedtime melatonin--does not seem to have made a difference, problem is mostly  due to general internal clock having him go to bed at 5 AM, patient is working on strategies to stay awake during the day to improve his sleep schedule here at night.  Will continue current measures, but do not feel escalation of these will be of much benefit without sedating patient.  1-14: Patient slept much better overnight with staying up during the day; continue medications for 1 more day, then start to reduce if sleep remains adequate  1-15: Not requiring trazodone as needed, continue melatonin scheduled - remains improved overall-insomnia resolved; DC trazodone on discharge  5. Neuropsych/cognition: This patient is capable of making decisions on his own behalf. 6. Skin/Wound Care: Monitor incision for healing - healing well.  7. Fluids/Electrolytes/Nutrition: Monitor I/O. Check CMET in am  -Labs, vital stable 1-16.  Eating well.  No concerns  8. HTN: BP has been well controlled PTA--will monitor BP TID --hydralazine prn added. - BP stable, monitor     12/28/2023    5:24 AM 12/27/2023    8:27 PM 12/27/2023    1:00 PM  Vitals with BMI  Systolic 99 124 109  Diastolic 60 65 63  Pulse 77 72 69    9. Persistent HA: related to bleed/post-op --Add Topamax 25 mg bid as well as Gabapentin 100 mg bid/300 mg/hs as above -add tramadol for breakthrough headache. D/c fioricet.  intermittent use of tramadol, continue scheduled Tylenol 4 times daily 650 mg No further complaints of headache  10. Vestibular sx: - Has had dizziness and diplopia--meclizine prn added --has dark glasses to help with light sensitivity.  -recover will be more about acclimation and tolerance - May benefit from vestibular therapy, will discuss with therapy teams this  week; none recommended per PT/OT, domes seem to be self resolving 1/15: Used as needed meclizine yesterday for dizziness, continue PRN   -No further use  11. Constipation: Increase Senna to 2 tabs bid. Continue Miralax daily. 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    -May benefit from oxygen study prior to discharge from rehab.  13. ABLA: recheck CBC in am.   -1/11 HGB up to 14.4; 1-13 stable at 14  Repeat Hgb on Monday  14. Low grade temp:             -wound looks clean, lungs are clear             -encourage IS             -monitor clinically for now  -1/12 Tmax 100.3 last night, no signs of infection noted, recheck labs tomorrow morning  1-13: WBC remained stable, last fever recorded 1-11  Repeat WBC on Monday   LOS: 8 days A FACE TO FACE EVALUATION WAS PERFORMED  Wayne Lee 12/28/2023, 12:33 PM

## 2023-12-29 DIAGNOSIS — I619 Nontraumatic intracerebral hemorrhage, unspecified: Secondary | ICD-10-CM | POA: Diagnosis not present

## 2023-12-29 MED ORDER — MELATONIN 5 MG PO TABS
10.0000 mg | ORAL_TABLET | Freq: Every day | ORAL | Status: DC
  Filled 2023-12-29 (×2): qty 2

## 2023-12-29 NOTE — Progress Notes (Signed)
PROGRESS NOTE   Subjective/Complaints: Slept poorly last night, will increase melatonin to 10mg  Tolerated therapy well today Has no other complaints today  ROS: Patient denies fever, new vision changes, dizziness, nausea, vomiting, diarrhea,  shortness of breath or chest pain, joint pain or mood change.  + Local neck/shoulder pain -ongoing, improved  + Headache-intermittent, ongoing  Objective:   No results found. No results for input(s): "WBC", "HGB", "HCT", "PLT" in the last 72 hours.  No results for input(s): "NA", "K", "CL", "CO2", "GLUCOSE", "BUN", "CREATININE", "CALCIUM" in the last 72 hours.    Intake/Output Summary (Last 24 hours) at 12/29/2023 1617 Last data filed at 12/29/2023 1335 Gross per 24 hour  Intake 480 ml  Output 675 ml  Net -195 ml        Physical Exam: Vital Signs Blood pressure 120/73, pulse 71, temperature 97.8 F (36.6 C), temperature source Oral, resp. rate 18, height 5\' 8"  (1.727 m), weight 110.1 kg, SpO2 99%.   General: No apparent distress.  Laying in bed. HEENT: Occiput incision C/D/I with skin glue in place.  Area of fluctuant edema continues to come down. + Mild nystagmus on leftward gaze--unchanged. Mild tenderness to palpation through neck, shoulders--improving Heart: Reg rate and rhythm.  No murmurs, rubs, or gallops.  Peripheral pulses intact. Chest: CTA bilaterally without wheezes, rales, or rhonchi; no distress Abdomen: Soft, non-tender, non-distended, bowel sounds positive. Psych: Pt's affect is appropriate. Pt is cooperative  Skin: Surgical site dermabonded, healing well.   Neuro:  Alert and awake.  Intact Memory. Normal language and speech. Cranial nerve 2-12 grossly intact.  Ongoing headache with EOMI, diplopia in all fields.  Improving. MMT: RUE and RLE 5/5. LUE and LLE 5-/5. Stable 1/19 Sensory exam normal for light touch and pain in all 4 limbs.  No appreciable ataxia  on exam; prior left upper extremity ataxia   Assessment/Plan: 1. Functional deficits which require 3+ hours per day of interdisciplinary therapy in a comprehensive inpatient rehab setting. Physiatrist is providing close team supervision and 24 hour management of active medical problems listed below. Physiatrist and rehab team continue to assess barriers to discharge/monitor patient progress toward functional and medical goals  Care Tool:  Bathing    Body parts bathed by patient: Right arm, Left arm, Chest, Abdomen, Front perineal area, Buttocks, Right upper leg, Face, Left upper leg   Body parts bathed by helper: Right lower leg, Left lower leg     Bathing assist Assist Level: Minimal Assistance - Patient > 75%     Upper Body Dressing/Undressing Upper body dressing   What is the patient wearing?: Pull over shirt    Upper body assist Assist Level: Supervision/Verbal cueing    Lower Body Dressing/Undressing Lower body dressing      What is the patient wearing?: Pants     Lower body assist Assist for lower body dressing: Contact Guard/Touching assist     Toileting Toileting    Toileting assist Assist for toileting: Minimal Assistance - Patient > 75%     Transfers Chair/bed transfer  Transfers assist     Chair/bed transfer assist level: Minimal Assistance - Patient > 75%     Locomotion Ambulation  Ambulation assist      Assist level: Moderate Assistance - Patient 50 - 74% Assistive device: No Device Max distance: 174ft   Walk 10 feet activity   Assist     Assist level: Moderate Assistance - Patient - 50 - 74% Assistive device: No Device   Walk 50 feet activity   Assist    Assist level: Moderate Assistance - Patient - 50 - 74% Assistive device: No Device    Walk 150 feet activity   Assist    Assist level: Moderate Assistance - Patient - 50 - 74% Assistive device: No Device    Walk 10 feet on uneven surface  activity   Assist      Assist level: Maximal Assistance - Patient 25 - 49% (on mulch)     Wheelchair     Assist Is the patient using a wheelchair?: No             Wheelchair 50 feet with 2 turns activity    Assist            Wheelchair 150 feet activity     Assist          Blood pressure 120/73, pulse 71, temperature 97.8 F (36.6 C), temperature source Oral, resp. rate 18, height 5\' 8"  (1.727 m), weight 110.1 kg, SpO2 99%.   Medical Problem List and Plan: 1. 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 - 12/31/23  -Continue CIR   - 1/14: Wife very supportive. Limited by pain in traps and neck per OT, supervision with UBD and Min A with transfer/toiletting. PT CGA transfers and Min-Mod for ambulation without AD, limited by balance deficits and endurance issues. At cognitive baseline per family, SLP recommending intermittent supervision but no new goals.   2.  Antithrombotics: -DVT/anticoagulation:  Pharmaceutical: Heparin added on 12/20/23             -antiplatelet therapy: N/A  3. Pain Management: tylenol prn.               --see below  -1-13: Increased muscle pain/soreness for patient.  Increase tramadol to 50 to 100 mg, add Robaxin 750 mg 3 times daily as needed, and add muscle rub Bengay twice daily for soreness.  1-14: Local pain on posterior head, asked nursing to apply ice to this area.  Otherwise, pain better controlled with current regimen.  1-15: Pain well-controlled on current regimen but having trouble staying ahead of pain - scheduled tramadol 50 mg TID and reduce PRN to 50 mg Q6H.  1-16: Pain stable/improved with scheduled as needed tramadol.  Continue this regimen.  1-17: Increase Topamax to 50 mg twice daily.  Reminded patient to use as needed tramadol if needed.   4. Mood/Behavior/Sleep: LCSW to follow for evaluation  and support.              --Gabapentin 300 mg/hs to assist with sleep/HA and hypnagogic hallucinations             -- antipsychotic agents: N/A  -- 1/11 Insomnia last night, discussed can try trazodone PRN  -- 1/12 at at bedtime melatonin--does not seem to have made a difference, problem is mostly due to general internal clock having him go to bed at 5 AM, patient is  working on strategies to stay awake during the day to improve his sleep schedule here at night.  Will continue current measures, but do not feel escalation of these will be of much benefit without sedating patient.  1-14: Patient slept much better overnight with staying up during the day; continue medications for 1 more day, then start to reduce if sleep remains adequate  1-15: Not requiring trazodone as needed, continue melatonin scheduled - remains improved overall-insomnia resolved; DC trazodone on discharge  5. Neuropsych/cognition: This patient is capable of making decisions on his own behalf. 6. Skin/Wound Care: Monitor incision for healing - healing well.  7. Fluids/Electrolytes/Nutrition: Monitor I/O. Check CMET in am  -Labs, vital stable 1-16.  Eating well.  No concerns  8. HTN: BP has been well controlled PTA--will monitor BP TID --hydralazine prn added. Continue - BP stable, monitor     12/29/2023    1:31 PM 12/29/2023    6:00 AM 12/28/2023    7:40 PM  Vitals with BMI  Systolic 120 106 161  Diastolic 73 78 79  Pulse 71 65 59    9. Persistent HA: related to bleed/post-op -continue Topamax 25 mg bid as well as Gabapentin 100 mg bid/300 mg/hs as above -add tramadol for breakthrough headache. D/c fioricet.  intermittent use of tramadol, continue scheduled Tylenol 4 times daily 650 mg No further complaints of headache  10. Vestibular sx: - Has had dizziness and diplopia--meclizine prn added --has dark glasses to help with light sensitivity.  -recover will be more about acclimation and tolerance - May benefit  from vestibular therapy, will discuss with therapy teams this week; none recommended per PT/OT, domes seem to be self resolving 1/15: Used as needed meclizine yesterday for dizziness, continue PRN   -No further use  11. Constipation: Increase Senna to 2 tabs bid. Continue Miralax daily. 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    -May benefit from oxygen study prior to discharge from rehab.  13. ABLA: recheck CBC in am.   -1/11 HGB up to 14.4; 1-13 stable at 14  Repeat Hgb on Monday  14. Low grade temp:             -wound looks clean, lungs are clear             -encourage IS             -monitor clinically for now  -1/12 Tmax 100.3 last night, no signs of infection noted, recheck labs tomorrow morning  1-13: WBC remained stable, last fever recorded 1-11  Repeat WBC on Monday   LOS: 9 days A FACE TO FACE EVALUATION WAS PERFORMED  Clint Bolder P Tynesia Harral 12/29/2023, 4:17 PM

## 2023-12-30 DIAGNOSIS — I614 Nontraumatic intracerebral hemorrhage in cerebellum: Secondary | ICD-10-CM | POA: Diagnosis not present

## 2023-12-30 LAB — CBC
HCT: 40.1 % (ref 39.0–52.0)
Hemoglobin: 13.5 g/dL (ref 13.0–17.0)
MCH: 27.6 pg (ref 26.0–34.0)
MCHC: 33.7 g/dL (ref 30.0–36.0)
MCV: 82 fL (ref 80.0–100.0)
Platelets: 374 10*3/uL (ref 150–400)
RBC: 4.89 MIL/uL (ref 4.22–5.81)
RDW: 13.2 % (ref 11.5–15.5)
WBC: 6.1 10*3/uL (ref 4.0–10.5)
nRBC: 0 % (ref 0.0–0.2)

## 2023-12-30 LAB — BASIC METABOLIC PANEL
Anion gap: 8 (ref 5–15)
BUN: 18 mg/dL (ref 6–20)
CO2: 22 mmol/L (ref 22–32)
Calcium: 9.4 mg/dL (ref 8.9–10.3)
Chloride: 102 mmol/L (ref 98–111)
Creatinine, Ser: 1.3 mg/dL — ABNORMAL HIGH (ref 0.61–1.24)
GFR, Estimated: >60 mL/min (ref >60)
Glucose, Bld: 105 mg/dL — ABNORMAL HIGH (ref 70–99)
Potassium: 3.9 mmol/L (ref 3.5–5.1)
Sodium: 132 mmol/L — ABNORMAL LOW (ref 135–145)

## 2023-12-30 MED ORDER — MELATONIN 5 MG PO TABS
10.0000 mg | ORAL_TABLET | Freq: Every day | ORAL | 0 refills | Status: DC
  Filled 2023-12-30: qty 60, 30d supply, fill #0

## 2023-12-30 MED ORDER — TRAMADOL HCL 50 MG PO TABS
50.0000 mg | ORAL_TABLET | Freq: Four times a day (QID) | ORAL | 0 refills | Status: DC | PRN
  Filled 2023-12-30: qty 28, 7d supply, fill #0

## 2023-12-30 MED ORDER — SENNOSIDES-DOCUSATE SODIUM 8.6-50 MG PO TABS
2.0000 | ORAL_TABLET | Freq: Every day | ORAL | 0 refills | Status: DC
  Filled 2023-12-30: qty 60, 30d supply, fill #0

## 2023-12-30 MED ORDER — GABAPENTIN 300 MG PO CAPS
300.0000 mg | ORAL_CAPSULE | Freq: Three times a day (TID) | ORAL | 0 refills | Status: DC
  Filled 2023-12-30: qty 90, 30d supply, fill #0

## 2023-12-30 MED ORDER — GABAPENTIN 300 MG PO CAPS
300.0000 mg | ORAL_CAPSULE | Freq: Three times a day (TID) | ORAL | Status: DC
Start: 2023-12-30 — End: 2023-12-31
  Administered 2023-12-30 (×2): 300 mg via ORAL
  Filled 2023-12-30 (×4): qty 1

## 2023-12-30 MED ORDER — DICLOFENAC SODIUM 1 % EX GEL
2.0000 g | Freq: Four times a day (QID) | CUTANEOUS | 0 refills | Status: DC
  Filled 2023-12-30: qty 100, 7d supply, fill #0

## 2023-12-30 MED ORDER — MUSCLE RUB 10-15 % EX CREA
1.0000 | TOPICAL_CREAM | CUTANEOUS | 0 refills | Status: DC | PRN
  Filled 2023-12-30: qty 85, 14d supply, fill #0

## 2023-12-30 MED ORDER — ACETAMINOPHEN 325 MG PO TABS: 650.0000 mg | ORAL_TABLET | Freq: Four times a day (QID) | ORAL | Status: DC

## 2023-12-30 MED ORDER — METHOCARBAMOL 750 MG PO TABS
750.0000 mg | ORAL_TABLET | Freq: Four times a day (QID) | ORAL | 0 refills | Status: DC | PRN
  Filled 2023-12-30: qty 60, 15d supply, fill #0

## 2023-12-30 MED ORDER — POLYVINYL ALCOHOL 1.4 % OP SOLN: 2.0000 [drp] | OPHTHALMIC | Status: DC | PRN

## 2023-12-30 MED ORDER — TOPIRAMATE 50 MG PO TABS
50.0000 mg | ORAL_TABLET | Freq: Two times a day (BID) | ORAL | 0 refills | Status: DC
  Filled 2023-12-30: qty 60, 30d supply, fill #0

## 2023-12-30 NOTE — Progress Notes (Signed)
Patient ID: Wayne Lee, male   DOB: October 13, 1976, 48 y.o.   MRN: [id number]  SW received FMLA forms for pt. SW provided to PA-Pam to complete.   Cecile Sheerer, MSW, LCSW Office: [phone] Cell: [phone] Fax: [phone]

## 2023-12-30 NOTE — Progress Notes (Signed)
PROGRESS NOTE   Subjective/Complaints: Vital stable.  No events overnight.  No acute complaints initially on exam, but shortly after starting interview patient has spontaneous episode of emesis of undigested pineapple.  He states this is not occurred before, and immediately felt better afterwards.  Patient cleaned up and participated with therapies today without incident.  Labs today significant for sodium 132, elevated creatinine 1.3, otherwise stable Last bowel movement 1-19, large, continent.  ROS: Patient denies fever, new vision changes, dizziness, nausea, vomiting, diarrhea,  shortness of breath or chest pain, joint pain or mood change.  + Local neck/shoulder pain -ongoing, improved  + Headache-intermittent, ongoing-improving  Objective:   No results found. Recent Labs    12/30/23 0515  WBC 6.1  HGB 13.5  HCT 40.1  PLT 374    Recent Labs    12/30/23 0515  NA 132*  K 3.9  CL 102  CO2 22  GLUCOSE 105*  BUN 18  CREATININE 1.30*  CALCIUM 9.4      Intake/Output Summary (Last 24 hours) at 12/30/2023 0813 Last data filed at 12/30/2023 0530 Gross per 24 hour  Intake 480 ml  Output 1150 ml  Net -670 ml        Physical Exam: Vital Signs Blood pressure 105/61, pulse 77, temperature 98.4 F (36.9 C), resp. rate 18, height 5\' 8"  (1.727 m), weight 110.1 kg, SpO2 97%.   General: No apparent distress.  Reclining in bedside chair. HEENT: Occiput incision C/D/I with skin glue in place.  + Mild nystagmus on leftward gaze--unchanged. Mild tenderness to palpation through neck, shoulders--ongoing, especially near the inion, but overall improved. Heart: Reg rate and rhythm.  No murmurs, rubs, or gallops.  Peripheral pulses intact. Chest: CTA bilaterally without wheezes, rales, or rhonchi; no distress Abdomen: Soft, non-tender, non-distended, bowel sounds positive.  Episode of emesis on exam.  Grossly undigested food,  no blood or coffee grounds. Psych: Pt's affect is appropriate. Pt is cooperative  Skin: Surgical site dermabonded, healing well.   Neuro:  Alert and awake.  Intact Memory. Normal language and speech. Cranial nerve 2-12 grossly intact.  Ongoing headache with EOMI, some trouble with distant fixation--ongoing MMT: RUE and RLE 5/5. LUE and LLE 5-/5. Stable 1/ 20 Sensory exam normal for light touch and pain in all 4 limbs.  No appreciable ataxia on exam; prior left upper extremity ataxia   Assessment/Plan: 1. Functional deficits which require 3+ hours per day of interdisciplinary therapy in a comprehensive inpatient rehab setting. Physiatrist is providing close team supervision and 24 hour management of active medical problems listed below. Physiatrist and rehab team continue to assess barriers to discharge/monitor patient progress toward functional and medical goals  Care Tool:  Bathing    Body parts bathed by patient: Right arm, Left arm, Chest, Abdomen, Front perineal area, Buttocks, Right upper leg, Face, Left upper leg, Right lower leg, Left lower leg   Body parts bathed by helper: Right lower leg, Left lower leg     Bathing assist Assist Level: Supervision/Verbal cueing     Upper Body Dressing/Undressing Upper body dressing   What is the patient wearing?: Pull over shirt    Upper body assist  Assist Level: Independent    Lower Body Dressing/Undressing Lower body dressing      What is the patient wearing?: Underwear/pull up, Pants     Lower body assist Assist for lower body dressing: Supervision/Verbal cueing     Toileting Toileting    Toileting assist Assist for toileting: Supervision/Verbal cueing     Transfers Chair/bed transfer  Transfers assist     Chair/bed transfer assist level: Supervision/Verbal cueing     Locomotion Ambulation   Ambulation assist      Assist level: Moderate Assistance - Patient 50 - 74% Assistive device: No Device Max  distance: 141ft   Walk 10 feet activity   Assist     Assist level: Moderate Assistance - Patient - 50 - 74% Assistive device: No Device   Walk 50 feet activity   Assist    Assist level: Moderate Assistance - Patient - 50 - 74% Assistive device: No Device    Walk 150 feet activity   Assist    Assist level: Moderate Assistance - Patient - 50 - 74% Assistive device: No Device    Walk 10 feet on uneven surface  activity   Assist     Assist level: Maximal Assistance - Patient 25 - 49% (on mulch)     Wheelchair     Assist Is the patient using a wheelchair?: No             Wheelchair 50 feet with 2 turns activity    Assist            Wheelchair 150 feet activity     Assist          Blood pressure 105/61, pulse 77, temperature 98.4 F (36.9 C), resp. rate 18, height 5\' 8"  (1.727 m), weight 110.1 kg, SpO2 97%.   Medical Problem List and Plan: 1. 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 - 12/31/23  -Continue CIR   - 1/14: Wife very supportive. Limited by pain in traps and neck per OT, supervision with UBD and Min A with transfer/toiletting. PT CGA transfers and Min-Mod for ambulation without AD, limited by balance deficits and endurance issues. At cognitive baseline per family, SLP recommending intermittent supervision but no new goals.   1-20: Episode of emesis this a.m.  No changes on neurologic exam to cause concern for acute deficit.  Notified nursing/therapy staff, no further incidents the rest of today, participated well.  2.  Antithrombotics: -DVT/anticoagulation:  Pharmaceutical: Heparin added on 12/20/23--ambulating over 300 feet with close supervision; DC DVT prophylaxis 1-20             -antiplatelet therapy: N/A  3. Pain Management: tylenol prn.               --see below  -1-13:  Increased muscle pain/soreness for patient.  Increase tramadol to 50 to 100 mg, add Robaxin 750 mg 3 times daily as needed, and add muscle rub Bengay twice daily for soreness.  1-14: Local pain on posterior head, asked nursing to apply ice to this area.  Otherwise, pain better controlled with current regimen.  1-15: Pain well-controlled on current regimen but having trouble staying ahead of pain - scheduled tramadol 50 mg TID and reduce PRN to 50 mg Q6H.  1-16: Pain  stable/improved with scheduled as needed tramadol.  Continue this regimen.  1-17: Increase Topamax to 50 mg twice daily.  Reminded patient to use as needed tramadol if needed.--Symptoms seem improved on this regimen   4. Mood/Behavior/Sleep: LCSW to follow for evaluation and support.              --Gabapentin 300 mg/hs to assist with sleep/HA and hypnagogic hallucinations             -- antipsychotic agents: N/A  -- 1/11 Insomnia last night, discussed can try trazodone PRN  -- 1/12 at at bedtime melatonin--does not seem to have made a difference, problem is mostly due to general internal clock having him go to bed at 5 AM, patient is working on strategies to stay awake during the day to improve his sleep schedule here at night.  Will continue current measures, but do not feel escalation of these will be of much benefit without sedating patient.  1-14: Patient slept much better overnight with staying up during the day; continue medications for 1 more day, then start to reduce if sleep remains adequate  1-15: Not requiring trazodone as needed, continue melatonin scheduled - remains improved overall-insomnia resolved; DC trazodone on discharge  5. Neuropsych/cognition: This patient is capable of making decisions on his own behalf. 6. Skin/Wound Care: Monitor incision for healing - healing well.  7. Fluids/Electrolytes/Nutrition: Monitor I/O. Check CMET in am  -Labs, vital stable 1-16.  Eating well.  No concerns  1-20: Mild AKI today.   Encourage p.o. fluids.  8. HTN: BP has been well controlled PTA--will monitor BP TID --hydralazine prn added. Continue - BP stable, monitor     12/30/2023    5:17 AM 12/29/2023    7:42 PM 12/29/2023    1:31 PM  Vitals with BMI  Systolic 105 118 098  Diastolic 61 63 73  Pulse 77 70 71    9. Persistent HA: related to bleed/post-op -continue Topamax 25 mg bid as well as Gabapentin 100 mg bid/300 mg/hs as above -add tramadol for breakthrough headache. D/c fioricet.  intermittent use of tramadol, continue scheduled Tylenol 4 times daily 650 mg No further complaints of headache  10. Vestibular sx: - Has had dizziness and diplopia--meclizine prn added --has dark glasses to help with light sensitivity.  -recover will be more about acclimation and tolerance - May benefit from vestibular therapy, will discuss with therapy teams this week; none recommended per PT/OT, domes seem to be self resolving 1/15: Used as needed meclizine yesterday for dizziness, continue PRN   -No further use  11. Constipation: Increase Senna to 2 tabs bid. Continue Miralax daily. --Resolved  -1/19 lbm    12. Hypersomnia hx: Was in process of getting work up for OSA.  Defer further workup to outpatient.  13. ABLA: recheck CBC in am.   -Stable 13-14.  14. Low grade temp: Resolved             -wound looks clean, lungs are clear             -encourage IS             -monitor clinically for now  -1/12 Tmax 100.3 last night, no signs of infection noted, recheck labs tomorrow morning  1-13: WBC remained stable, last fever recorded 1-11   LOS: 10 days A FACE TO FACE EVALUATION WAS PERFORMED  Angelina Sheriff 12/30/2023, 8:13 AM

## 2023-12-30 NOTE — Progress Notes (Signed)
Occupational Therapy Session Note  Patient Details  Name: Wayne Lee MRN: [id number] Date of Birth: 08/30/1976  Today's Date: 12/30/2023 OT Individual Time: 0865-7846 OT Individual Time Calculation (min): 55 min    Short Term Goals: Week 2:  OT Short Term Goal 1 (Week 2): STG= LTG d/t ELOS  Skilled Therapeutic Interventions/Progress Updates:   Pt received supine with no c/o pain, agreeable to OT session. Reviewed d/c planning and completed cognitive, FMC, visual testing necessary for d/c summary. He completed bed mobility at mod I level. Ambulatory transfer into the bathroom at (S) level. He doffed clothing in standing with (S), min cueing for hand placement. He transferred onto the TTB and completed bathing seated with set up assist, intermittent (S) when standing. He dressed sit <> stand with (S). He took a brief rest break before completing functional mobility ,150 ft, to the therapy gym with close (S). He completed 9 hole peg test and dynamometer testing- in d/c summary. He returned to his room, 150 ft with close (S). He was left sitting up in the recliner with all needs met, breakfast present.   Therapy Documentation Precautions:  Precautions Precautions: Fall, Other (comment) Precaution Comments: monitor BP, intermittent diplopia, R>L lean/LOB Restrictions Weight Bearing Restrictions Per Provider Order: No  Therapy/Group: Individual Therapy  Crissie Reese 12/30/2023, 6:05 AM

## 2023-12-30 NOTE — Progress Notes (Signed)
Physical Therapy Discharge Summary  Patient Details  Name: Wayne Lee MRN: [id number] Date of Birth: 1976-05-28  Date of Discharge from PT service:December 30, 2023  Today's Date: 12/30/2023 PT Individual Time: 0902-0959 PT Individual Time Calculation (min): 57 min    Patient has met 9 of 9 long term goals due to improved activity tolerance, improved balance, improved postural control, improved awareness, and improved coordination.  Patient to discharge at an ambulatory level Supervision.   Patient's care partner is independent to provide the necessary physical assistance at discharge.  Reasons goals not met: NA  Recommendation:  Patient will benefit from ongoing skilled PT services in outpatient setting to continue to advance safe functional mobility, address ongoing impairments in balance, ambulation, endurance, and minimize fall risk.  Equipment: No equipment provided  Reasons for discharge: treatment goals met and discharge from hospital  Patient/family agrees with progress made and goals achieved: Yes  Skilled Therapeutic Interventions: Pt received seated in recliner and agrees to therapy. Repots some pain in head and shoulders. Number not provided. PT provides rest breaks as needed and gentle mobility to manage pain. Pt performs sit to stand with cues for initiation. Pt ambulates x100' to gym with close supervision and cues for trunk rotation and arm swing to improve balance. Pt completes Nustep for 10:00 at workload of 5 and average steps per minute ~50. Performed primarily with BLEs with upper extremities allowed to move passively on handles for gentle mobility of shoulders and neck. Completed for endurance training, reciprocal coordination, and for pain management. PT provides cues for hand and foot placement and completing full available ROM.   Pt ambulates x300' to orthogym with close supervision and same cues. Pt takes seated rest break. Pt then completes ramp navigation  and car transfer with cues for sequencing. Pt ambulates to main gym and completes x12 6" with bilateral handrails and cues for step sequencing. Pt then completes x4 steps without handrails to simulate home setup, requiring CGA for safety.   Pt completes BERG balance test and FGA, as detailed below. Pt left seated in recliner with all needs within reach.   PT Discharge Precautions/Restrictions Precautions Precautions: Fall Restrictions Weight Bearing Restrictions Per Provider Order: No Pain Interference Pain Interference Pain Effect on Sleep: 2. Occasionally Pain Interference with Therapy Activities: 2. Occasionally Pain Interference with Day-to-Day Activities: 2. Occasionally Vision/Perception  Vision - History Ability to See in Adequate Light: 1 Impaired Vision - Assessment Eye Alignment: Within Functional Limits Ocular Range of Motion: Within Functional Limits Alignment/Gaze Preference: Within Defined Limits Tracking/Visual Pursuits: Able to track stimulus in all quads without difficulty Convergence: Impaired (comment) (L eye does not fully converge in) Additional Comments: No diplopia for the last week Perception Perception: Within Functional Limits Praxis Praxis: WFL  Cognition Overall Cognitive Status: Within Functional Limits for tasks assessed Arousal/Alertness: Awake/alert Memory: Appears intact Awareness: Appears intact Problem Solving: Appears intact Safety/Judgment: Appears intact Sensation Sensation Light Touch: Appears Intact Hot/Cold: Appears Intact Coordination Gross Motor Movements are Fluid and Coordinated: No Fine Motor Movements are Fluid and Coordinated: No 9 Hole Peg Test: R: 28.7. L:37.2 Motor  Motor Motor: Ataxia  Mobility Bed Mobility Bed Mobility: Sit to Supine;Supine to Sit Supine to Sit: Independent Sit to Supine: Independent Transfers Transfers: Sit to Stand;Stand to Sit;Stand Pivot Transfers Sit to Stand: Supervision/Verbal  cueing Stand to Sit: Supervision/Verbal cueing Stand Pivot Transfers: Supervision/Verbal cueing Transfer (Assistive device): None Locomotion  Gait Ambulation: Yes Gait Assistance: Supervision/Verbal cueing Gait Distance (Feet): 300 Feet Assistive device:  None Gait Assistance Details: Verbal cues for technique;Verbal cues for precautions/safety;Verbal cues for sequencing;Verbal cues for gait pattern Gait Gait: Yes Gait Pattern: Impaired Gait Pattern:  (increased postural sway, much improved from eval) Stairs / Additional Locomotion Stairs: Yes Stairs Assistance: Supervision/Verbal cueing Stair Management Technique: Two rails Number of Stairs: 12 Height of Stairs: 6 Ramp: Supervision/Verbal cueing Curb: Supervision/Verbal cueing Wheelchair Mobility Wheelchair Mobility: No  Trunk/Postural Assessment  Cervical Assessment Cervical Assessment: Within Functional Limits Thoracic Assessment Thoracic Assessment: Within Functional Limits Lumbar Assessment Lumbar Assessment: Within Functional Limits Postural Control Postural Control: Deficits on evaluation Trunk Control: Postural sway/perturbations during dynamic balance demands  Balance Balance Balance Assessed: Yes Standardized Balance Assessment Standardized Balance Assessment: Berg Balance Test Berg Balance Test Sit to Stand: Able to stand without using hands and stabilize independently Standing Unsupported: Able to stand safely 2 minutes Sitting with Back Unsupported but Feet Supported on Floor or Stool: Able to sit safely and securely 2 minutes Stand to Sit: Sits safely with minimal use of hands Transfers: Able to transfer safely, minor use of hands Standing Unsupported with Eyes Closed: Able to stand 10 seconds with supervision Standing Ubsupported with Feet Together: Able to place feet together independently and stand 1 minute safely From Standing, Reach Forward with Outstretched Arm: Can reach confidently >25 cm  (10") From Standing Position, Pick up Object from Floor: Able to pick up shoe safely and easily From Standing Position, Turn to Look Behind Over each Shoulder: Looks behind from both sides and weight shifts well Turn 360 Degrees: Able to turn 360 degrees safely in 4 seconds or less Standing Unsupported, Alternately Place Feet on Step/Stool: Able to stand independently and safely and complete 8 steps in 20 seconds Standing Unsupported, One Foot in Front: Able to plae foot ahead of the other independently and hold 30 seconds Standing on One Leg: Unable to try or needs assist to prevent fall Total Score: 50 Static Sitting Balance Static Sitting - Balance Support: Feet supported Static Sitting - Level of Assistance: 7: Independent Dynamic Sitting Balance Dynamic Sitting - Balance Support: Feet supported Dynamic Sitting - Level of Assistance: 7: Independent Static Standing Balance Static Standing - Balance Support: During functional activity Static Standing - Level of Assistance: 6: Modified independent (Device/Increase time) Dynamic Standing Balance Dynamic Standing - Balance Support: No upper extremity supported Dynamic Standing - Level of Assistance: 5: Stand by assistance Functional Gait  Assessment Gait assessed : Yes Gait Level Surface: Walks 20 ft in less than 5.5 sec, no assistive devices, good speed, no evidence for imbalance, normal gait pattern, deviates no more than 6 in outside of the 12 in walkway width. Change in Gait Speed: Able to change speed, demonstrates mild gait deviations, deviates 6-10 in outside of the 12 in walkway width, or no gait deviations, unable to achieve a major change in velocity, or uses a change in velocity, or uses an assistive device. Gait with Horizontal Head Turns: Performs head turns smoothly with no change in gait. Deviates no more than 6 in outside 12 in walkway width Gait with Vertical Head Turns: Performs task with moderate change in gait velocity,  slows down, deviates 10-15 in outside 12 in walkway width but recovers, can continue to walk. Gait and Pivot Turn: Pivot turns safely within 3 sec and stops quickly with no loss of balance. Step Over Obstacle: Is able to step over one shoe box (4.5 in total height) without changing gait speed. No evidence of imbalance. Gait with Narrow Base of  Support: Ambulates less than 4 steps heel to toe or cannot perform without assistance. Gait with Eyes Closed: Walks 20 ft, uses assistive device, slower speed, mild gait deviations, deviates 6-10 in outside 12 in walkway width. Ambulates 20 ft in less than 9 sec but greater than 7 sec. Ambulating Backwards: Walks 20 ft, uses assistive device, slower speed, mild gait deviations, deviates 6-10 in outside 12 in walkway width. Steps: Alternating feet, must use rail. Total Score: 20 Extremity Assessment  RUE Assessment RUE Assessment: Within Functional Limits General Strength Comments: Grip strength: R: 90 lbs. L: 85 lbs LUE Assessment LUE Assessment: Within Functional Limits General Strength Comments: Strength WFL, coordination deficits minor RLE Assessment RLE Assessment: Within Functional Limits LLE Assessment LLE Assessment: Within Functional Limits   Beau Fanny, PT, DPT 12/30/2023, 4:09 PM

## 2023-12-30 NOTE — Progress Notes (Signed)
Occupational Therapy Weekly Progress Note  Patient Details  Name: Wayne Lee MRN: [id number] Date of Birth: 11-Aug-1976  Beginning of progress report period: December 21, 2023 End of progress report period: December 28, 2023   Patient has met 4 of 4 short term goals.  [name] has made excellent progress in CIR and is approaching (S)- mod I goal level for all ADLs and transfers. Family education has been completed with his supportive wife.   Patient continues to demonstrate the following deficits: muscle weakness, decreased cardiorespiratoy endurance, and decreased standing balance and decreased balance strategies and therefore will continue to benefit from skilled OT intervention to enhance overall performance with BADL, iADL, and School/Education.  Patient progressing toward long term goals..  Continue plan of care.  OT Short Term Goals Week 1:  OT Short Term Goal 1 (Week 1): Pt will be able to ambulate to bathroom with min A of 1 person. OT Short Term Goal 1 - Progress (Week 1): Met OT Short Term Goal 2 (Week 1): Pt will be able to step over tub wall with CGA and use of grab bars. OT Short Term Goal 2 - Progress (Week 1): Met OT Short Term Goal 3 (Week 1): pt will be able to don socks and shoes with supervision. OT Short Term Goal 3 - Progress (Week 1): Met OT Short Term Goal 4 (Week 1): Pt will be able to recall 3 activities he did in his last therapy session to demonstrate improvement in day to day memory. OT Short Term Goal 4 - Progress (Week 1): Met Week 2:  OT Short Term Goal 1 (Week 2): STG= LTG d/t ELOS   Crissie Reese 12/30/2023, 6:06 AM

## 2023-12-30 NOTE — Progress Notes (Signed)
Occupational Therapy Discharge Summary  Patient Details  Name: Wayne Lee MRN: [id number] Date of Birth: 1976-10-08  Date of Discharge from OT service:December 30, 2023  Today's Date: 12/30/2023 OT Individual Time: 4098-1191 OT Individual Time Calculation (min): 71 min    Patient has met 12 of 12 long term goals due to improved activity tolerance, improved balance, postural control, ability to compensate for deficits, functional use of  LEFT upper and LEFT lower extremity, improved attention, improved awareness, and improved coordination.  Patient to discharge at overall Supervision level.  Patient's care partner is independent to provide the necessary physical assistance at discharge. Wayne Lee has made excellent progress in CIR and quickly progressed to a (S)- mod I level with all ADLs and transfers. His wife is very supportive and has completed family education.   Recommendation:  Patient will benefit from ongoing skilled OT services in outpatient setting to continue to advance functional skills in the area of BADL, iADL, and School/Education- addressing LUE FMC, gross motor coordination, higher level dynamic balance, and fall risk reduction.   Equipment: No equipment provided  Reasons for discharge: treatment goals met and discharge from hospital  Patient/family agrees with progress made and goals achieved: Yes  Skilled OT Intervention Pt received supine with no c/o pain, agreeable to OT session. Wife present initially. 100 ft of functional mobility with close (S) to the therapy gym. He completed lunge activity to challenge getting in/out of narrow BOS and provide high dynamic balance and core stabilization challenge. He required min A initially and then faded to CGA. 3x8 repetitions. He then completed floor transfer with (S) when getting supine > sitting to a surface, simulation floor to couch or chair transfer. Provided education on fall recovery, when to get up independently vs when to  call for EMS after a fall. He then completed 3x full kneeling to standing transfers, requiring min A with UE support required. Pt completed various core stabilization exercises EOM, with focus on managing intraabdominal pressure, core strengthening, and coordination to challenge dynamic sitting balance, improved stability in standing, and ultimately reduced fall risk during ADLs. Skilled cueing required for proper muscle engagement, as well as grading of resistance to provide appropriate difficulty level. He then transitioned into supine on the mat, with a moist heat pack provided to cervical spine for pain relief. He completed 3x10 repetitions- glute bridges and hip adduction with ball to challenge posterior chain strengthening for carryover to dynamic balance and fall risk reduction during ADLs. Pt returned to their room following. Pt was left sitting up in the recliner with all needs met and call bell within reach.     OT Discharge Precautions/Restrictions  Precautions Precautions: Fall Restrictions Weight Bearing Restrictions Per Provider Order: No   ADL ADL Eating: Independent Grooming: Independent Upper Body Bathing: Supervision/safety Where Assessed-Upper Body Bathing: Shower Lower Body Bathing: Supervision/safety Where Assessed-Lower Body Bathing: Shower Upper Body Dressing: Independent Where Assessed-Upper Body Dressing: Chair Lower Body Dressing: Supervision/safety Where Assessed-Lower Body Dressing: Chair Toileting: Supervision/safety Where Assessed-Toileting: Teacher, adult education: Close supervision Toilet Transfer Method: Insurance claims handler: Close supervison Web designer Method: Event organiser: Close supervision Film/video editor Method: Designer, industrial/product: Grab bars Vision Baseline Vision/History: 4 Cataracts Vision Assessment?: Yes Eye Alignment: Within Primary school teacher Range of Motion: Within  Functional Limits Alignment/Gaze Preference: Within Defined Limits Tracking/Visual Pursuits: Able to track stimulus in all quads without difficulty Convergence: Impaired (comment) (L eye does not fully converge in) Additional Comments: No diplopia for the  last week Perception  Perception: Within Functional Limits Praxis Praxis: WFL Cognition Cognition Overall Cognitive Status: Within Functional Limits for tasks assessed Arousal/Alertness: Awake/alert Memory: Appears intact Awareness: Appears intact Problem Solving: Appears intact Safety/Judgment: Appears intact Brief Interview for Mental Status (BIMS) Repetition of Three Words (First Attempt): 3 Temporal Orientation: Year: Correct Temporal Orientation: Month: Accurate within 5 days Temporal Orientation: Day: Correct Recall: "Sock": No, could not recall Recall: "Blue": Yes, no cue required Recall: "Bed": Yes, no cue required BIMS Summary Score: 13 Sensation Sensation Light Touch: Appears Intact Hot/Cold: Appears Intact Coordination Gross Motor Movements are Fluid and Coordinated: No Fine Motor Movements are Fluid and Coordinated: No 9 Hole Peg Test: R: 28.7. L:37.2 Motor  Motor Motor: Ataxia Mobility  Bed Mobility Bed Mobility: Sit to Supine;Supine to Sit Supine to Sit: Independent Sit to Supine: Independent Transfers Sit to Stand: Supervision/Verbal cueing Stand to Sit: Supervision/Verbal cueing  Trunk/Postural Assessment  Cervical Assessment Cervical Assessment: Within Functional Limits Thoracic Assessment Thoracic Assessment: Within Functional Limits Lumbar Assessment Lumbar Assessment: Within Functional Limits Postural Control Postural Control: Deficits on evaluation Trunk Control: Postural sway/perturbations during dynamic balance demands  Balance Balance Balance Assessed: Yes Static Sitting Balance Static Sitting - Balance Support: Feet supported Static Sitting - Level of Assistance: 7:  Independent Dynamic Sitting Balance Dynamic Sitting - Balance Support: Feet supported Dynamic Sitting - Level of Assistance: 7: Independent Static Standing Balance Static Standing - Balance Support: Bilateral upper extremity supported Static Standing - Level of Assistance: 6: Modified independent (Device/Increase time) Dynamic Standing Balance Dynamic Standing - Balance Support: No upper extremity supported Dynamic Standing - Level of Assistance: 5: Stand by assistance Extremity/Trunk Assessment RUE Assessment RUE Assessment: Within Functional Limits General Strength Comments: Grip strength: R: 90 lbs. L: 85 lbs LUE Assessment LUE Assessment: Within Functional Limits General Strength Comments: Strength WFL, coordination deficits minor   Crissie Reese 12/30/2023, 8:29 AM

## 2023-12-31 ENCOUNTER — Other Ambulatory Visit (HOSPITAL_COMMUNITY): Payer: Self-pay

## 2023-12-31 DIAGNOSIS — R519 Headache, unspecified: Secondary | ICD-10-CM | POA: Insufficient documentation

## 2023-12-31 DIAGNOSIS — I614 Nontraumatic intracerebral hemorrhage in cerebellum: Secondary | ICD-10-CM | POA: Diagnosis not present

## 2023-12-31 NOTE — Discharge Summary (Signed)
Physician Discharge Summary  Patient ID: Wayne Lee MRN: [id number] DOB/AGE: 1976/02/13 48 y.o.  Admit date: 12/20/2023 Discharge date: 12/31/2023  Discharge Diagnoses:  Principal Problem:   Cerebellar hemorrhage (HCC) Active Problems:   Hypertension   ICH (intracerebral hemorrhage) (HCC)   New onset of headaches  Acute renal failure  Hyponatremia   Discharged Condition: stable  Significant Diagnostic Studies:   Labs:  Basic Metabolic Panel:    Latest Ref Rng & Units 12/30/2023    5:15 AM 12/26/2023    6:26 AM 12/21/2023    6:07 AM  BMP  Glucose 70 - 99 mg/dL 536  644  034   BUN 6 - 20 mg/dL 18  15  18    Creatinine 0.61 - 1.24 mg/dL 7.42  5.95  6.38   Sodium 135 - 145 mmol/L 132  134  135   Potassium 3.5 - 5.1 mmol/L 3.9  3.5  3.9   Chloride 98 - 111 mmol/L 102  99  100   CO2 22 - 32 mmol/L 22  25  25    Calcium 8.9 - 10.3 mg/dL 9.4  9.3  9.4      CBC:    Latest Ref Rng & Units 12/30/2023    5:15 AM 12/26/2023    6:26 AM 12/23/2023    5:32 AM  CBC  WBC 4.0 - 10.5 K/uL 6.1  5.1  8.4   Hemoglobin 13.0 - 17.0 g/dL 75.6  43.3  29.5   Hematocrit 39.0 - 52.0 % 40.1  40.3  41.7   Platelets 150 - 400 K/uL 374  322  287     Vitals:    12/31/2023    4:23 AM 12/30/2023    7:49 PM 12/30/2023    2:15 PM  Vitals with BMI  Systolic 109 119 188  Diastolic 65 72 81  Pulse 81 71 84    CBG: No results for input(s): "GLUCAP" in the last 168 hours.  Brief HPI:   Wayne Lee is a 48 y.o. male with history of eczema, obesity who was admitted on 12/16/2023 with sudden onset of headache, emesis x 2 and dizziness.  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  UDS was negative.  He was started on hypertonic saline and taken to the OR  emergently for suboccipital craniectomy for decompression of cerebellar hemorrhage by Dr. Danielle Dess. Postprocedure MRI brain showed residual hematoma with small volume SAH and no evidence of tumor or lesion and improvement in basilar cistern and fourth ventricle patency.    Dr. Roda Shutters felt that hemorrhage likely hypertensive in nature and recommended cerebral angiogram once ICH resolves to rule out AVM.  Patient was treated with Keppra x 7 days for seizure prophylaxis.  Tmax fever of 101 felt to be reactive and has resolved without antibiotics.  Patient continued to report persistent headaches, diplopia on far left field, insomnia with hypocholic hallucinations as well as dizziness and intermittent hiccups.  Therapy was working with patient who was noted to have unsteady gait with balance deficits as well as high-level cognitive deficits.  He was independent working prior to admission and was requiring min to mod assist overall.  CIR was recommended due to functional decline.   Hospital Course: Wayne Lee was admitted to rehab 12/20/2023 for inpatient  therapies to consist of PT, ST and OT at least three hours five days a week. Past admission physiatrist, therapy team and rehab RN have worked together to provide customized collaborative inpatient rehab. Blood pressures were monitored on TID basis and have been controlled without medications.  Gabapentin was added to help with reports of sleep wake disruption with hypnagogic hallucinations as well as ongoing HA. Low dose Topamax added and titrated up to 50 mg bid. He continued to report HA with neck and shoulder pain which has improved with addition of tramadol and robaxin. Tramadol was weaned down to 50 mg qid and he was advised on tapering this off as symptoms improved.   Po intake was improving but he did develop episode of emesis with nausea on 01/20 felt to be due to increase in gabapentin dose. He was advised to decrease or stop this if nausea continued to be  an issue. Follow up CBC showed that ABLA has resolved. Check of lytes showed drop in sodium with AKI. He was advised to improve fluid intake and recommend repeat check BMET in 5-7 days to monitor for improvement.  Diplopia has resolved with improvement in pain control. He has progressed to supervision level and will continue to receive follow up outpatient PT and OT at [facility] after discharge.    Rehab course: During patient's stay in rehab weekly team conferences were held to monitor patient's progress, set goals and discuss barriers to discharge. At admission, patient required mod assist with mobility and in assist with basic ADL tasks. Cognitive evaluation revealed mild deficits in STM and attention. He  has had improvement in activity tolerance, balance, postural control as well as ability to compensate for deficits. He is able to complete ADL tsks with supervision.  He requires supervision with cues for transfers and to ambulate 300 without AD.  BERG balance score has improved to 50/56. Cognition has improved. He is modified independent for basic and mildly complex tasks and requries supervision with complex cognitive tasks.  Family education has been completed.   Discharge disposition: 01-Home or Self Care  Diet: Heart Healthy.  Special Instructions: No driving or strenuous activity till cleared by MD. 2.  Recommend repeat BMET in 5-7 days to monitor Na and renal function.   Discharge Instructions     Ambulatory referral to Neurology   Complete by: As directed    An appointment is requested in approximately: 4-6  weeks   Ambulatory referral to Occupational Therapy   Complete by: As directed    Eval and treat   Ambulatory referral to Physical Medicine Rehab   Complete by: As directed    Ambulatory referral to Physical Therapy   Complete by: As directed    Eval and treat      Allergies as of 12/31/2023       Reactions   Penicillins Hives, Swelling         Medication List     STOP taking these medications    butalbital-acetaminophen-caffeine 50-325-40 MG tablet Commonly known as: FIORICET   promethazine 12.5 MG tablet Commonly known as: PHENERGAN       TAKE these medications    acetaminophen 325 MG tablet Commonly known as: TYLENOL Take 2 tablets (650 mg total) by mouth 4 (four) times daily. What changed:  medication strength how much to take when to take this reasons to take this   Analgesic Balm 10-15 % Crea Apply 1 Application topically as needed for muscle pain. To anterior  shins   atorvastatin 20 MG tablet Commonly known as: LIPITOR Take 1 tablet (20 mg total) by mouth daily.   diclofenac Sodium 1 % Gel Commonly known as: VOLTAREN Apply 2 g topically 4 (four) times daily. To neck and shoulders   gabapentin 300 MG capsule Commonly known as: NEURONTIN Take 1 capsule (300 mg total) by mouth 3 (three) times daily. Notes to patient: This is for nerve pain and headaches.    melatonin 5 MG Tabs Take 2 tablets (10 mg total) by mouth at bedtime.   methocarbamol 750 MG tablet Commonly known as: ROBAXIN Take 1 tablet (750 mg total) by mouth every 6 (six) hours as needed for muscle spasms (muscle soreness, tightness).   polyvinyl alcohol 1.4 % ophthalmic solution Commonly known as: LIQUIFILM TEARS Place 2 drops into both eyes as needed for dry eyes.   Senna-S 8.6-50 MG tablet Generic drug: senna-docusate Take 2 tablets by mouth daily at 6 (six) AM. Notes to patient: For constipation   topiramate 50 MG tablet Commonly known as: TOPAMAX Take 1 tablet (50 mg total) by mouth 2 (two) times daily.   traMADol 50 MG tablet--Rx# 28 pills Commonly known as: Ultram Take 1 tablet (50 mg total) by mouth every 6 (six) hours as needed. Notes to patient: You can decrease to three time a day for a couple of days then twice a day for a few days then wean to as needed    triamcinolone 0.025 % ointment Commonly known as:  KENALOG Apply 1 Application topically 2 (two) times daily as needed (Dermatitis).        Follow-up Information     Micki Riley, MD Follow up.   Specialties: Neurology, Radiology Why: office will call you with follow up appointment and to set follow up CT head. Contact information: [address] [place] [address] [phone]         Barnett Abu, MD Follow up.   Specialty: Neurosurgery Why: Call in 1-2 days for post hospital follow up Contact information: [address] [place] [address] [phone]         Elijah Birk C, DO Follow up.   Specialty: Physical Medicine and Rehabilitation Why: office will call you with follow up appointment Contact information: [address] [place] [address] [phone]         Card, Aloha Gell, MD Follow up.   Specialty: Internal Medicine Why: Call in 1-2 days for post hospital follow up--labs on Friday. Contact information: [address] [place] [address] [phone]                 Signed: Jacquelynn Cree 12/31/2023, 4:51 PM

## 2023-12-31 NOTE — Progress Notes (Signed)
Inpatient Rehabilitation Discharge Medication Review by a Pharmacist  A complete drug regimen review was completed for this patient to identify any potential clinically significant medication issues.  High Risk Drug Classes Is patient taking? Indication by Medication  Antipsychotic No   Anticoagulant No   Antibiotic No   Opioid Yes PRN Tramadol -  pain  Antiplatelet No   Hypoglycemics/insulin No   Vasoactive Medication No   Chemotherapy No   Other Yes  Acetaminophen (scheduled) - pain, headaches Atorvastatin - hyperlipidemia Gabapentin - headaches and sleep Melatonin - sleep Topiramate - headaches Diclofenac gel, muscle rub - topical pain relief Senna-docusate - laxative  PRNs: Methocarbamol - muscle spasms, soreness, tightness Artificial Tear - dry eyes Triamcinolone cream - dermatitis     Type of Medication Issue Identified Description of Issue Recommendation(s)  Drug Interaction(s) (clinically significant)     Duplicate Therapy     Allergy     No Medication Administration End Date     Incorrect Dose     Additional Drug Therapy Needed     Significant med changes from prior encounter (inform family/care partners about these prior to discharge). Prior PRN Fioricet and promethazine added during inpatient admission discontinued. Not needing. All medications are new, except Atorvastatin. Communicate changes with patient/family prior to discharge.  Other       Clinically significant medication issues were identified that warrant physician communication and completion of prescribed/recommended actions by midnight of the next day:  No  Pharmacist comments:   Time spent performing this drug regimen review (minutes):  20  Dennie Fetters, [place] 12/31/2023 9:16 AM

## 2023-12-31 NOTE — Progress Notes (Signed)
PROGRESS NOTE   Subjective/Complaints: Vital stable.  No events overnight.   Another episode of emesis this AM, not associated with much preceeding nausea, relieved after. Patient has noticed it occurred after receiving  gabapentin. No other neurologic changes on exam; d/w patient and his wife OP monitoring and stopping gabapentin if this recurs.   ROS: Patient denies fever, new vision changes, dizziness,  diarrhea,  shortness of breath or chest pain, joint pain or mood change.  + Local neck/shoulder pain -ongoing, improved  + Headache-intermittent, ongoing-improving + fatigue - ongoing + nausea/vomitting - 2x  Objective:   No results found. Recent Labs    12/30/23 0515  WBC 6.1  HGB 13.5  HCT 40.1  PLT 374    Recent Labs    12/30/23 0515  NA 132*  K 3.9  CL 102  CO2 22  GLUCOSE 105*  BUN 18  CREATININE 1.30*  CALCIUM 9.4      Intake/Output Summary (Last 24 hours) at 12/31/2023 2116 Last data filed at 12/31/2023 0800 Gross per 24 hour  Intake 0 ml  Output --  Net 0 ml        Physical Exam: Vital Signs Blood pressure 109/65, pulse 81, temperature 98.6 F (37 C), resp. rate 17, height 5\' 8"  (1.727 m), weight 110.1 kg, SpO2 100%.   General: No apparent distress. Laying in bed.  HEENT: Occiput incision C/D/I with skin glue in place.  + 1-2 beat nystagmus when looking left; otherwise resolved Mild tenderness to palpation through neck, shoulders--ongoing  Heart: Reg rate and rhythm.  No murmurs, rubs, or gallops.  Peripheral pulses intact. Chest: CTA bilaterally without wheezes, rales, or rhonchi; no distress Abdomen: Soft, non-tender, non-distended, bowel sounds positive.   Psych: Pt's affect is appropriate if mildly flat. Pt is cooperative  Skin: Surgical site dermabonded, healing well.   Neuro:  Alert and awake.  Intact Memory. Normal language and speech. Cranial nerve 2-12 grossly intact.  EOMI.  PERRLA. NO dizziness or double vision, fixation intact.  MMT: RUE and RLE 5/5. LUE and LLE 5-/5. Stable 1/ 21 Sensory exam normal for light touch and pain in all 4 limbs.  No appreciable ataxia in bilateral FTN, HTS. Fine motor intact.    Assessment/Plan: 1. Functional deficits which require 3+ hours per day of interdisciplinary therapy in a comprehensive inpatient rehab setting. Physiatrist is providing close team supervision and 24 hour management of active medical problems listed below. Physiatrist and rehab team continue to assess barriers to discharge/monitor patient progress toward functional and medical goals  Care Tool:  Bathing    Body parts bathed by patient: Right arm, Left arm, Chest, Abdomen, Front perineal area, Buttocks, Right upper leg, Face, Left upper leg, Right lower leg, Left lower leg   Body parts bathed by helper: Right lower leg, Left lower leg     Bathing assist Assist Level: Supervision/Verbal cueing     Upper Body Dressing/Undressing Upper body dressing   What is the patient wearing?: Pull over shirt    Upper body assist Assist Level: Independent    Lower Body Dressing/Undressing Lower body dressing      What is the patient wearing?: Underwear/pull up,  Pants     Lower body assist Assist for lower body dressing: Supervision/Verbal cueing     Toileting Toileting    Toileting assist Assist for toileting: Supervision/Verbal cueing     Transfers Chair/bed transfer  Transfers assist     Chair/bed transfer assist level: Supervision/Verbal cueing     Locomotion Ambulation   Ambulation assist      Assist level: Supervision/Verbal cueing Assistive device: No Device Max distance: 300'   Walk 10 feet activity   Assist     Assist level: Supervision/Verbal cueing Assistive device: No Device   Walk 50 feet activity   Assist    Assist level: Supervision/Verbal cueing Assistive device: No Device    Walk 150 feet  activity   Assist    Assist level: Supervision/Verbal cueing Assistive device: No Device    Walk 10 feet on uneven surface  activity   Assist     Assist level: Supervision/Verbal cueing     Wheelchair     Assist Is the patient using a wheelchair?: No             Wheelchair 50 feet with 2 turns activity    Assist            Wheelchair 150 feet activity     Assist          Blood pressure 109/65, pulse 81, temperature 98.6 F (37 C), resp. rate 17, height 5\' 8"  (1.727 m), weight 110.1 kg, SpO2 100%.   Medical Problem List and Plan: 1. 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 - 12/31/23  -Continue CIR   - 1/14: Wife very supportive. Limited by pain in traps and neck per OT, supervision with UBD and Min A with transfer/toiletting. PT CGA transfers and Min-Mod for ambulation without AD, limited by balance deficits and endurance issues. At cognitive baseline per family, SLP recommending intermittent supervision but no new goals.   1-20: Episode of emesis this a.m.  No changes on neurologic exam to cause concern for acute deficit.  Notified nursing/therapy staff, no further incidents the rest of today, participated well--recurred 1/21 AM with taking PO medications     The patient is medically ready for discharge to home and will need [facility] with [facility] PM[facility]. In addition, they will need to follow up with their PCP, Neurosurgery.   2.  Antithrombotics: -DVT/anticoagulation:  Pharmaceutical: Heparin added on 12/20/23--ambulating over 300 feet with close supervision; DC DVT prophylaxis 1-20             -antiplatelet therapy: N/A  3. Pain Management: tylenol prn.               --see below  -1-13: Increased muscle pain/soreness for patient.  Increase tramadol to 50 to 100 mg, add Robaxin 750 mg 3 times daily as  needed, and add muscle rub Beng[place] twice daily for soreness.  1-14: Local pain on posterior head, asked nursing to apply ice to this area.  Otherwise, pain better controlled with current regimen.  1-15: Pain well-controlled on current regimen but having trouble staying ahead of pain - scheduled tramadol 50 mg TID and reduce PRN to 50 mg Q6H.  1-16: Pain stable/improved with scheduled as needed tramadol.  Continue this regimen.  1-17: Increase Topamax to  50 mg twice daily.  Reminded patient to use as needed tramadol if needed.--Symptoms seem improved on this regimen  1/21: headache constant but continues improving, stable   4. Mood/Behavior/Sleep: LCSW to follow for evaluation and support.              --Gabapentin 300 mg/hs to assist with sleep/HA and hypnagogic hallucinations             -- antipsychotic agents: N/A  -- 1/11 Insomnia last night, discussed can try trazodone PRN  -- 1/12 at at bedtime melatonin--does not seem to have made a difference, problem is mostly due to general internal clock having him go to bed at 5 AM, patient is working on strategies to stay awake during the day to improve his sleep schedule here at night.  Will continue current measures, but do not feel escalation of these will be of much benefit without sedating patient.  1-14: Patient slept much better overnight with staying up during the day; continue medications for 1 more day, then start to reduce if sleep remains adequate  1-15: Not requiring trazodone as needed, continue melatonin scheduled - remains improved overall-insomnia resolved; DC trazodone on discharge  1/21: Vomiting with gabapentin x2 in AM; advised patient and wife to monitor for recurrence and stop medication if ongoing  5. Neuropsych/cognition: This patient is capable of making decisions on his own behalf. 6. Skin/Wound Care: Monitor incision for healing - healing well.  7. Fluids/Electrolytes/Nutrition: Monitor I/O. Check CMET in am  -Labs, vital  stable 1-16.  Eating well.  No concerns  1-20: Mild AKI and Na down 134 to 132 today.  Encourage p.o. fluids.  1/21 follow up OP with PCP   8. HTN: BP has been well controlled PTA--will monitor BP TID --hydralazine prn added. Continue - BP stable, monitor     12/31/2023    4:23 AM 12/30/2023    7:49 PM 12/30/2023    2:15 PM  Vitals with BMI  Systolic 109 119 657  Diastolic 65 72 81  Pulse 81 71 84    9. Persistent HA: related to bleed/post-op -continue Topamax 25 mg bid as well as Gabapentin 100 mg bid/300 mg/hs as above -add tramadol for breakthrough headache. D/c fioricet.  intermittent use of tramadol, continue scheduled Tylenol 4 times daily 650 mg  - Ongoing but gradually improving; discussed with patient and wife this should continue to improve as blood resorbs  10. Vestibular sx: - Has had dizziness and diplopia--meclizine prn added --has dark glasses to help with light sensitivity.  -recover will be more about acclimation and tolerance - May benefit from vestibular therapy, will discuss with therapy teams this week; none recommended per PT/OT, domes seem to be self resolving 1/15: Used as needed meclizine yesterday for dizziness, continue PRN  1/21: continues with some intermittent nausea, but vertigo/dizziness resolved per patient.   11. Constipation: Increase Senna to 2 tabs bid. Continue Miralax daily. --Resolved  -1/19 lbm    12. Hypersomnia hx: Was in process of getting work up for OSA.  Defer further workup to outpatient.  13. ABLA: recheck CBC in am.   -Stable 13-14.  14. Low grade temp: Resolved             -wound looks clean, lungs are clear             -encourage IS             -monitor clinically for now  -1/12 Tmax 100.3 last night, no  signs of infection noted, recheck labs tomorrow morning  1-13: WBC remained stable, last fever recorded 1-11   LOS: 11 days A FACE TO FACE EVALUATION WAS PERFORMED  Angelina Sheriff 12/31/2023, 9:16 PM

## 2024-01-01 NOTE — Progress Notes (Signed)
Inpatient Rehabilitation Care Coordinator Discharge Note   Patient Details  Name: Wayne Lee MRN: [id number] Date of Birth: May 22, 1976   Discharge location: D/c to home with his wife  Length of Stay: 10 days  Discharge activity level: Supervision  Home/community participation: Limited  Patient response BM:WUXLKG Literacy - How often do you need to have someone help you when you read instructions, pamphlets, or other written material from your doctor or pharmacy?: Never  Patient response MW:NUUVOZ Isolation - How often do you feel lonely or isolated from those around you?: Never  Services provided included: MD, RD, PT, OT, RN, CM, TR, Pharmacy, Neuropsych, SW  Financial Services:  Field seismologist Utilized: Private Insurance [employer]  Choices offered to/list presented to: patient and pt wife  Follow-up services arranged:  Outpatient    Outpatient Servicies: PT/OT referral to [facility] location      Patient response to transportation need: Is the patient able to respond to transportation needs?: Yes In the past 12 months, has lack of transportation kept you from medical appointments or from getting medications?: No In the past 12 months, has lack of transportation kept you from meetings, work, or from getting things needed for daily living?: No   Patient/Family verbalized understanding of follow-up arrangements:  Yes  Individual responsible for coordination of the follow-up plan: contact pt wife [phone]  Confirmed correct DME delivered: Gretchen Short 01/01/2024    Comments (or additional information):fam edu  Summary of Stay    Date/Time Discharge Planning CSW  12/23/23 1534 Pt will discharge to home with his wife who will be primary caregiver. SW will confirm there are no barriers to discharge. AAC       Samanta Gal A Lula Olszewski

## 2024-01-02 ENCOUNTER — Telehealth: Payer: Self-pay

## 2024-01-02 NOTE — Telephone Encounter (Signed)
Transitional Care call--who you talked with    Are you/is patient experiencing any problems since coming home? Are there any questions regarding any aspect of care? No questions at this time Are there any questions regarding medications administration/dosing? Are meds being taken as prescribed? Patient should review meds with caller to confirm No questions at this time Have there been any falls? No  Has Home Health been to the house and/or have they contacted you? If not, have you tried to contact them? Can we help you contact them? Not yet Are bowels and bladder emptying properly? Are there any unexpected incontinence issues? If applicable, is patient following bowel/bladder programs? No issues at this time Any fevers, problems with breathing, unexpected pain? Not at this time Are there any skin problems or new areas of breakdown? None at this time Has the patient/family member arranged specialty MD follow up (ie cardiology/neurology/renal/surgical/etc)?  Can we help arrange? Yes Does the patient need any other services or support that we can help arrange? Not at this time Are caregivers following through as expected in assisting the patient? Yes Has the patient quit smoking, drinking alcohol, or using drugs as recommended? Yes Patient is to arrive at 8:45 for a 9:00 am appt with Dr Shearon Stalls [address] [phone]

## 2024-01-03 ENCOUNTER — Ambulatory Visit: Payer: 59 | Attending: Physical Medicine and Rehabilitation

## 2024-01-03 ENCOUNTER — Other Ambulatory Visit: Payer: Self-pay

## 2024-01-03 DIAGNOSIS — R42 Dizziness and giddiness: Secondary | ICD-10-CM | POA: Diagnosis present

## 2024-01-03 DIAGNOSIS — R2689 Other abnormalities of gait and mobility: Secondary | ICD-10-CM | POA: Diagnosis present

## 2024-01-03 DIAGNOSIS — R262 Difficulty in walking, not elsewhere classified: Secondary | ICD-10-CM | POA: Diagnosis present

## 2024-01-03 DIAGNOSIS — R41842 Visuospatial deficit: Secondary | ICD-10-CM | POA: Insufficient documentation

## 2024-01-03 DIAGNOSIS — I619 Nontraumatic intracerebral hemorrhage, unspecified: Secondary | ICD-10-CM | POA: Insufficient documentation

## 2024-01-03 DIAGNOSIS — M6281 Muscle weakness (generalized): Secondary | ICD-10-CM | POA: Diagnosis present

## 2024-01-03 DIAGNOSIS — R278 Other lack of coordination: Secondary | ICD-10-CM | POA: Diagnosis present

## 2024-01-03 NOTE — Therapy (Signed)
OUTPATIENT PHYSICAL THERAPY NEURO EVALUATION   Patient Name: Wayne Lee MRN: [id number] DOB:[birth year], 48 y.o., male Today's Date: 01/03/2024   PCP: No PCP REFERRING PROVIDER: Delle Reining, PA-C  END OF SESSION:  PT End of Session - 01/03/24 1158     Visit Number 1    Number of Visits 16    Date for PT Re-Evaluation 03/27/24    Authorization Type [employer]    PT Start Time 1102    PT Stop Time 1146    PT Time Calculation (min) 44 min    Activity Tolerance Treatment limited secondary to medical complications (Comment)   increased headache pain, laid down a lot t/o eval, vomiting at end   Behavior During Therapy [facility] for tasks assessed/performed             Past Medical History:  Diagnosis Date   Atopic dermatitis in adult    Left cataract    Obesity (BMI 30-39.9)    Undescended left testicle    had surgical intervention   Past Surgical History:  Procedure Laterality Date   CATARACT EXTRACTION W/ INTRAOCULAR LENS IMPLANT Left 2017   CHOLECYSTECTOMY     ORCHIOPEXY  1984   SUBOCCIPITAL CRANIECTOMY CERVICAL LAMINECTOMY N/A 12/16/2023   Procedure: SUBOCCIPITAL CRANIECTOMY;  Surgeon: Barnett Abu, MD;  Location: [facility] OR;  Service: Neurosurgery;  Laterality: N/A;   Patient Active Problem List   Diagnosis Date Noted   New onset of headaches 12/31/2023   ICH (intracerebral hemorrhage) (HCC) 12/20/2023   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   Hypertension 12/19/2023   Brain compression (HCC) 12/19/2023   Nontraumatic acute cerebral hemorrhage (HCC) 12/16/2023   Cerebellar hemorrhage (HCC) 12/16/2023    ONSET DATE: 12/15/22  REFERRING DIAG:  Diagnosis  I61.9 (ICD-10-CM) - Nontraumatic acute cerebral hemorrhage (HCC)    THERAPY DIAG:  Nontraumatic acute cerebral hemorrhage (HCC)  Difficulty in walking, not elsewhere classified  Dizziness and giddiness  Rationale for Evaluation and Treatment: Rehabilitation  SUBJECTIVE:                                                                                                                                                                                              SUBJECTIVE STATEMENT: Pt reports light sensitivity and pulsing headaches since craniectomy.  He had a stroke on 1/6 and was in hospital from 1/6-1/21. He continues to struggle with balance, HA, vomiting from overstimulation. Pt accompanied by: significant other  PERTINENT HISTORY: 1/6 stroke, ICU and IRF leaving hospital 1/21  PAIN:  Are you having pain? Yes: NPRS scale: 7/10 Pain location: occiput Pain description: aching, pulsing Aggravating factors:  positional changes Relieving factors: medicine, stretching neck and shoulders, ice/heat  PRECAUTIONS: None  RED FLAGS: None   WEIGHT BEARING RESTRICTIONS: No  FALLS: Has patient fallen in last 6 months? No  LIVING ENVIRONMENT: Lives with: lives with their family and lives with their spouse Lives in: House/apartment Stairs: Yes: Internal: 15 steps; can reach both and External: 2 steps; none front door 4 steps rails on both sides Has following equipment at home: None  PLOF:  supervision for I/ADLs  PATIENT GOALS: walking, energy, endurance  OBJECTIVE:  Note: Objective measures were completed at Evaluation unless otherwise noted.  DIAGNOSTIC FINDINGS:  Nothing new  COGNITION: Overall cognitive status: Within functional limits for tasks assessed   SENSATION: WFL  COORDINATION: Toe taps and heel-to-shin normal  EDEMA:  None  MUSCLE TONE: RLE: Within functional limits  MUSCLE LENGTH: NT  POSTURE:  very wide BOS   LOWER EXTREMITY ROM:   WFL   Active  Right Eval Left Eval  Hip flexion    Hip extension    Hip abduction    Hip adduction    Hip internal rotation    Hip external rotation    Knee flexion    Knee extension    Ankle dorsiflexion    Ankle plantarflexion    Ankle inversion    Ankle eversion     (Blank rows = not  tested)  LOWER EXTREMITY MMT:    MMT Right Eval Left Eval  Hip flexion 5/5 5/5  Hip extension    Hip abduction 5/5 5/5  Hip adduction 5/5 5/5  Hip internal rotation    Hip external rotation    Knee flexion 5/5 5/5  Knee extension 5/5 5/5  Ankle dorsiflexion    Ankle plantarflexion    Ankle inversion    Ankle eversion    (Blank rows = not tested)  BED MOBILITY:  Sit to supine Complete Independence and Modified independence Supine to sit Modified independence Rolling to Right Complete Independence Rolling to Left Complete Independence  TRANSFERS: Assistive device utilized: None  Sit to stand: Modified independence Stand to sit: Complete Independence Chair to chair: Modified independence Floor:  NT   STAIRS: Level of Assistance:  NT Stair Negotiation Technique:  with  Number of Stairs:   Height of Stairs:   Comments: NT  GAIT: Gait pattern: wide BOS, R lateral lean using wall to balance on the way back to room Distance walked: 50 ft Assistive device utilized: None Level of assistance: Min A with HHA from wife (pt did not want to, but was leaning) Comments: could potentially use AD  FUNCTIONAL TESTS:  5 times sit to stand: 9.26 seconds Berg Balance Scale: 48/56  PATIENT SURVEYS:  NT today                                                                                                                              TREATMENT DATE: 01/03/24    PATIENT EDUCATION: Education details:  Diagnosis, Prognosis, POC, holding off on HEP Person educated: Patient and Spouse Education method: Explanation, Demonstration, Tactile cues, and Verbal cues Education comprehension: verbalized understanding, returned demonstration, verbal cues required, tactile cues required, and needs further education  HOME EXERCISE PROGRAM: TBD  GOALS: Goals reviewed with patient? No  SHORT TERM GOALS: Target date: 01/25/24  Initial HEP will be prescribed by 3rd visit Baseline: not  provided  Goal status: INITIAL  2.  Headache pain will not increase above 7/10 during session.  Baseline: started at 7/10, pt verbalized it was worse at end of eval  Goal status: INITIAL  3.  Activity increase will not elicit vomiting. Baseline: vomited at end of eval after BERG testing. Goal status: INITIAL   LONG TERM GOALS: Target date: 03/09/24  Pt will be independent with advanced HEP, as prescribed throughout POC. Baseline: not prescribed yet Goal status: INITIAL  2.  Pt will decrease HA pain to </= 4/10 with activity.  Baseline: > 7/10 Goal status: INITIAL  3.  Pt will increase BERG balance test score to >/= 52/56. Baseline: 48/56 Goal status: INITIAL  4.  Pt will complete full session without vomiting or any other adverse reactions.  Baseline: vomited after eval Goal status: INITIAL    ASSESSMENT:  CLINICAL IMPRESSION: Patient is a 48 y.o. male who was seen today for physical therapy evaluation and treatment for stroke related symptoms and post suboccipital craniectomy.  Pt spent a week in inpatient rehab at [facility] and was d/c on 12/31/23. Pt's wife accompanies him, correcting history throughout subjective questioning. Pt masks well and is able to carry on conversation or perform tasks for short periods, prior to lying down for prolonged periods during evaluation. He needed multiple rest breaks during BERG testing, and reported higher headache pain level. Pt verbalized feeling like he would vomit at end of session, and he did vomit multiple times. He and his wife were educated on dx, px, POC, and future HEP. Pt and spouse verbalized understanding and consent to treatment. Pt was too fatigued for any further mental or physical stimulation today. Turned lights off and allowed pt to lie down and rest post evaluation. He would benefit from skilled PT 1-2x/week for 8 weeks to slowly rebuild endurance, brain/body connection, activity tolerance, improve gait mechanics, and  balance.  OBJECTIVE IMPAIRMENTS: decreased activity tolerance, decreased balance, difficulty walking, decreased safety awareness, impaired perceived functional ability, increased muscle spasms, and pain.   ACTIVITY LIMITATIONS: standing, stairs, and locomotion level  PARTICIPATION LIMITATIONS: cleaning, laundry, driving, shopping, community activity, and occupation  PERSONAL FACTORS: 1 comorbidity: history of stroke  are also affecting patient's functional outcome.   REHAB POTENTIAL: Good  CLINICAL DECISION MAKING: Evolving/moderate complexity  EVALUATION COMPLEXITY: Moderate  PLAN:  PT FREQUENCY: 1-2x/week  PT DURATION: 8 weeks  PLANNED INTERVENTIONS: 97164- PT Re-evaluation, 97110-Therapeutic exercises, 97530- Therapeutic activity, 97112- Neuromuscular re-education, 97535- Self Care, 16109- Manual therapy, and 97116- Gait training  PLAN FOR NEXT SESSION: Prescribe HEP, slow progression of balance, NMR, rest breaks to address overstimulation (maybe turn lights off), consider DGI or more advanced test when pt is appropriate   Marcelline Mates, PT, DPT 01/03/2024, 12:03 PM

## 2024-01-07 ENCOUNTER — Ambulatory Visit: Payer: 59 | Admitting: Occupational Therapy

## 2024-01-07 DIAGNOSIS — M6281 Muscle weakness (generalized): Secondary | ICD-10-CM

## 2024-01-07 DIAGNOSIS — R41842 Visuospatial deficit: Secondary | ICD-10-CM

## 2024-01-07 DIAGNOSIS — R278 Other lack of coordination: Secondary | ICD-10-CM

## 2024-01-07 DIAGNOSIS — I619 Nontraumatic intracerebral hemorrhage, unspecified: Secondary | ICD-10-CM | POA: Diagnosis not present

## 2024-01-07 DIAGNOSIS — R2689 Other abnormalities of gait and mobility: Secondary | ICD-10-CM

## 2024-01-07 NOTE — Therapy (Addendum)
OUTPATIENT OCCUPATIONAL THERAPY NEURO EVALUATION  Patient Name: Wayne Lee MRN: [id number] DOB:April 08, 1976, 48 y.o., male Today's Date: 01/07/2024  PCP: none REFERRING PROVIDER: Dr. Shearon Lee  END OF SESSION:  OT End of Session - 01/07/24 1653     Visit Number 1    Number of Visits 25    Date for OT Re-Evaluation 03/31/24    Authorization Type [employer]    Authorization Time Period 12 weeks    OT Start Time 1450    OT Stop Time 1528    OT Time Calculation (min) 38 min    Activity Tolerance Other (comment)    Behavior During Therapy --   pt vomited end of session            Past Medical History:  Diagnosis Date   Atopic dermatitis in adult    Left cataract    Obesity (BMI 30-39.9)    Undescended left testicle    had surgical intervention   Past Surgical History:  Procedure Laterality Date   CATARACT EXTRACTION W/ INTRAOCULAR LENS IMPLANT Left 2017   CHOLECYSTECTOMY     ORCHIOPEXY  1984   SUBOCCIPITAL CRANIECTOMY CERVICAL LAMINECTOMY N/A 12/16/2023   Procedure: SUBOCCIPITAL CRANIECTOMY;  Surgeon: Barnett Abu, MD;  Location: [facility] OR;  Service: Neurosurgery;  Laterality: N/A;   Patient Active Problem List   Diagnosis Date Noted   New onset of headaches 12/31/2023   ICH (intracerebral hemorrhage) (HCC) 12/20/2023   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   Hypertension 12/19/2023   Brain compression (HCC) 12/19/2023   Nontraumatic acute cerebral hemorrhage (HCC) 12/16/2023   Cerebellar hemorrhage (HCC) 12/16/2023    ONSET DATE: 12/16/23  REFERRING DIAG: I61.9 (ICD-10-CM) - Nontraumatic acute cerebral hemorrhage (HCC)   THERAPY DIAG:  Other lack of coordination - Plan: Ot plan of care cert/re-cert  Other abnormalities of gait and mobility - Plan: Ot plan of care cert/re-cert  Visuospatial deficit - Plan: Ot plan of care cert/re-cert  Muscle weakness (generalized) - Plan: Ot plan of care cert/re-cert  Rationale for Evaluation and Treatment:  Rehabilitation  SUBJECTIVE:   SUBJECTIVE STATEMENT: Pt reports he wants to get back to previous level Pt accompanied by: significant other  PERTINENT HISTORY: 48 yo male arrival 1/6 with HTN 204/106 taken to emergent OR hematoma evacuation s/p suboccipital craniotomy and hypertonic saline started. Extubated 1/7 PMH obesity, sleep apnea, HLD d/c home 1/21/5 Per pt's wife AVM  CT Head without contrast: Large acute 5.3 cm intraparenchymal hemorrhage in the left cerebellum. Mass effect with narrowed fourth ventricle, but no hydrocephalus at this time. Basal cisterns are effaced and there is early ascending transtentorial herniation. PRECAUTIONS: Fall  WEIGHT BEARING RESTRICTIONS: No  PAIN:  Are you having pain? Yes: NPRS scale: moderate Pain location: headache Pain description: aching Aggravating factors: light sensitive Relieving factors: less light  FALLS: Has patient fallen in last 6 months? No  LIVING ENVIRONMENT: Lives with: lives with their family and lives with their spouse Lives in: House/apartment Stairs: yes  Has following equipment at home: None  PLOF: Independent  PATIENT GOALS: return to prior level  OBJECTIVE:  Note: Objective measures were completed at Evaluation unless otherwise noted.  HAND DOMINANCE: Right  ADLs: Overall ADLs: mod I-supervision with basic ADLS Transfers/ambulation related to ADLs: Eating: mod I Grooming: mod I UB Dressing: mod I LB Dressing: mod I Toileting: mod I Bathing: supervision, sits in bottom of bathtub Tub Shower transfers: supervision   IADLs:dependent for IADLS Medication management: wife is Teacher, English as a foreign language: dependent  MOBILITY STATUS:  mod I-supervision    ACTIVITY TOLERANCE: Activity tolerance: limited by nausea   UPPER EXTREMITY ROM:  WFLS   UPPER EXTREMITY MMT:     MMT Right eval Left eval  Shoulder flexion 4+/5 4/5  Shoulder abduction    Shoulder adduction    Shoulder  extension    Shoulder internal rotation    Shoulder external rotation    Middle trapezius    Lower trapezius    Elbow flexion 4+/5 4+/5  Elbow extension 4+/5 4/5  Wrist flexion    Wrist extension    Wrist ulnar deviation    Wrist radial deviation    Wrist pronation    Wrist supination    (Blank rows = not tested)  HAND FUNCTION: Grip strength: Right: 63 lbs; Left: 48 lbs  COORDINATION: 9 Hole Peg test: Right: 26.25 sec; Left: 32.80 with several drops sec  SENSATION: WFL  COGNITION: Overall cognitive status:  recalls 3/3 words following short delay, spells WORLD backwards without difficulty  VISION: Subjective report: Pt reports double vision at times   VISION ASSESSMENT: To be further assessed in functional context Pt reports diplopia at times He was able to track to all 4 quadrants and visual fiels were intact. He became nauseous and vomited shortly after vision testing. Pt appears to have some vestibular issues.  Patient has difficulty with following activities due to following visual impairments: reading    OBSERVATIONS: Pleasant agreeable male, accompanied by his wife and 1 y.o son Pt's BP was elevated at end of session when he was vomiting 140/102, and 148/115, pt was instructed to monitor at home and to contact MD if diastolic BP does not reduce to below 100. pt's wife verbalized understandinge.                                                                                                                              TREATMENT DATE: 01/07/24         PATIENT EDUCATION: Education details: role of OT, potential goals, CVA warning signs and recommendation pt contacts MD if diastolic BP remains above 100 Person educated: Patient and Spouse Education method: Explanation Education comprehension: verbalized understanding  HOME EXERCISE PROGRAM: n/a   GOALS: Goals reviewed with patient? Yes  SHORT TERM GOALS: Target date: 02/06/24  I with HEP  Goal  status: INITIAL  2.  Pt will demonstrate improved fine motor coordination as evidenced by decreasing 9 hole peg test LUE to 29 secs or less without several drops.  Goal status: INITIAL  3.  Pt will increase LUE grip strength to 52 lbs or greater for increased ease with daily activities.  Goal status: INITIAL  4.  Pt will perfrom tabletop scanning wihout diplopia or vomiting with 90% or better accuracy.  Goal status: INITIAL  5.  Pt will perfrom transitional movments for ADLS/ IADLs without LOB or nausea greater than 3/10.  Goal status: INITIAL  6. I with adpaed strategies  for ADLs/IADLs to improve safety and I.   Goal status: INITIAL  LONG TERM GOALS: Target date: 03/31/24  I with updated HEP  Goal status: INITIAL  2.  Pt will perform mod complex home management mod I  Goal status: INITIAL  3.  Pt will perform environmental scanning in a busy environment with 90% or better accuracy, without vomiting  Goal status: INITIAL  4.  Pt will perfrom simulated work activities mod I  Goal status: INITIAL    ASSESSMENT:  CLINICAL IMPRESSION: Patient is a 48 y.o. male who was seen today for occupational therapy evaluation for I61.9 (ICD-10-CM) - Nontraumatic acute cerebral hemorrhage (HCC) . s/p post suboccipital craniectomy.  Pt received therapies at CIR at [facility] and was d/c on 12/31/23.Pt. will benefit from skilled occupational therapy to address  :decreased strength, decreased coordination, ADLs, IADLs, decreased balance, visual deficits which impedes performance of daily activities.     PERFORMANCE DEFICITS: in functional skills including ADLs, IADLs, coordination, dexterity, ROM, strength, Fine motor control, Gross motor control, balance, endurance, decreased knowledge of precautions, decreased knowledge of use of DME, vision, UE functional use, and vestibular, cognitive skills including safety awareness, and psychosocial skills including coping strategies,  environmental adaptation, habits, interpersonal interactions, and routines and behaviors.   IMPAIRMENTS: are limiting patient from ADLs, IADLs, rest and sleep, work, play, leisure, and social participation.   CO-MORBIDITIES: may have co-morbidities  that affects occupational performance. Patient will benefit from skilled OT to address above impairments and improve overall function.  MODIFICATION OR ASSISTANCE TO COMPLETE EVALUATION: Min-Moderate modification of tasks or assist with assess necessary to complete an evaluation.  OT OCCUPATIONAL PROFILE AND HISTORY: Detailed assessment: Review of records and additional review of physical, cognitive, psychosocial history related to current functional performance.  CLINICAL DECISION MAKING: LOW - limited treatment options, no task modification necessary  REHAB POTENTIAL: Good  EVALUATION COMPLEXITY: Low    PLAN:  OT FREQUENCY: 2x/week plus eval  OT DURATION: 12 weeks  PLANNED INTERVENTIONS: 97168 OT Re-evaluation, 97535 self care/ADL training, 13086 therapeutic exercise, 97530 therapeutic activity, 97112 neuromuscular re-education, 97140 manual therapy, 97116 gait training, 57846 aquatic therapy, 97035 ultrasound, 97018 paraffin, 96295 moist heat, 97010 cryotherapy, 97034 contrast bath, balance training, functional mobility training, visual/perceptual remediation/compensation, energy conservation, coping strategies training, patient/family education, and DME and/or AE instructions  RECOMMENDED OTHER SERVICES: PT  CONSULTED AND AGREED WITH PLAN OF CARE: Patient  PLAN FOR NEXT SESSION: coordination and grip strength HEP   Lacole Komorowski, OT 01/07/2024, 5:15 PM

## 2024-01-08 ENCOUNTER — Ambulatory Visit: Payer: 59 | Admitting: Physical Therapy

## 2024-01-08 ENCOUNTER — Telehealth: Payer: Self-pay | Admitting: Physical Medicine and Rehabilitation

## 2024-01-08 NOTE — Telephone Encounter (Signed)
Contacted patient and spoke with wife yesterday after reading PT eval note. She reported that he's been having N/V as well as dizziness since getting home. They did see Dr. Danielle Dess who felt that this was related to vestibular issues. She did relay that they were taking gabapentin bid and I asked them to stop it as this could be cause of symptoms. He was planning to go to OT for eval and informed them to ask about vestibular eval/Tx and that I would call them back.   Dicussed patient with Dr. Shearon Stalls and called wife back this afternoon. She relayed that he's was not able to attend OT eval or PT today due to severity of symptoms--had thrown up multiple times yesterday. Today was dizzy and not feeling well in his 'belly" and she requested something for  nausea/vestibular symptoms.  Discussed patient again with Dr. Shearon Stalls who recommended ED eval for labs and imaging. Discussed this with wife who was hesitant regarding ED.  Reached out to wife who was hesitant about going to ED. She was hesitant and we discussed options of ED v/s urgent care for ease of care that the importance of getting him evaluated by a provider due to ongoing issues.

## 2024-01-09 ENCOUNTER — Emergency Department (HOSPITAL_BASED_OUTPATIENT_CLINIC_OR_DEPARTMENT_OTHER)
Admission: EM | Admit: 2024-01-09 | Discharge: 2024-01-09 | Disposition: A | Discharge status: Home / Self Care | Payer: 59 | Attending: Emergency Medicine | Admitting: Emergency Medicine

## 2024-01-09 ENCOUNTER — Other Ambulatory Visit: Payer: Self-pay

## 2024-01-09 ENCOUNTER — Other Ambulatory Visit (HOSPITAL_BASED_OUTPATIENT_CLINIC_OR_DEPARTMENT_OTHER): Payer: Self-pay

## 2024-01-09 ENCOUNTER — Encounter (HOSPITAL_BASED_OUTPATIENT_CLINIC_OR_DEPARTMENT_OTHER): Payer: Self-pay | Admitting: Emergency Medicine

## 2024-01-09 ENCOUNTER — Emergency Department (HOSPITAL_BASED_OUTPATIENT_CLINIC_OR_DEPARTMENT_OTHER): Payer: 59

## 2024-01-09 DIAGNOSIS — R112 Nausea with vomiting, unspecified: Secondary | ICD-10-CM | POA: Diagnosis not present

## 2024-01-09 DIAGNOSIS — R42 Dizziness and giddiness: Secondary | ICD-10-CM | POA: Diagnosis not present

## 2024-01-09 DIAGNOSIS — I10 Essential (primary) hypertension: Secondary | ICD-10-CM | POA: Insufficient documentation

## 2024-01-09 DIAGNOSIS — R519 Headache, unspecified: Secondary | ICD-10-CM | POA: Diagnosis present

## 2024-01-09 DIAGNOSIS — Z79899 Other long term (current) drug therapy: Secondary | ICD-10-CM | POA: Diagnosis not present

## 2024-01-09 HISTORY — DX: Cerebral infarction, unspecified: I63.9

## 2024-01-09 LAB — COMPREHENSIVE METABOLIC PANEL
ALT: 75 U/L — ABNORMAL HIGH (ref 0–44)
AST: 35 U/L (ref 15–41)
Albumin: 4.4 g/dL (ref 3.5–5.0)
Alkaline Phosphatase: 106 U/L (ref 38–126)
Anion gap: 8 (ref 5–15)
BUN: 17 mg/dL (ref 6–20)
CO2: 26 mmol/L (ref 22–32)
Calcium: 10 mg/dL (ref 8.9–10.3)
Chloride: 106 mmol/L (ref 98–111)
Creatinine, Ser: 1.21 mg/dL (ref 0.61–1.24)
GFR, Estimated: >60 mL/min (ref >60)
Glucose, Bld: 96 mg/dL (ref 70–99)
Potassium: 3.6 mmol/L (ref 3.5–5.1)
Sodium: 140 mmol/L (ref 135–145)
Total Bilirubin: 0.4 mg/dL (ref 0.0–1.2)
Total Protein: 7.6 g/dL (ref 6.5–8.1)

## 2024-01-09 LAB — CBC WITH DIFFERENTIAL/PLATELET
Abs Immature Granulocytes: 0.01 10*3/uL (ref 0.00–0.07)
Basophils Absolute: 0 10*3/uL (ref 0.0–0.1)
Basophils Relative: 0 %
Eosinophils Absolute: 0.1 10*3/uL (ref 0.0–0.5)
Eosinophils Relative: 3 %
HCT: 42.7 % (ref 39.0–52.0)
Hemoglobin: 13.9 g/dL (ref 13.0–17.0)
Immature Granulocytes: 0 %
Lymphocytes Relative: 29 %
Lymphs Abs: 1.1 10*3/uL (ref 0.7–4.0)
MCH: 27.2 pg (ref 26.0–34.0)
MCHC: 32.6 g/dL (ref 30.0–36.0)
MCV: 83.6 fL (ref 80.0–100.0)
Monocytes Absolute: 0.4 10*3/uL (ref 0.1–1.0)
Monocytes Relative: 11 %
Neutro Abs: 2.2 10*3/uL (ref 1.7–7.7)
Neutrophils Relative %: 57 %
Platelets: 306 10*3/uL (ref 150–400)
RBC: 5.11 MIL/uL (ref 4.22–5.81)
RDW: 13.7 % (ref 11.5–15.5)
WBC: 3.9 10*3/uL — ABNORMAL LOW (ref 4.0–10.5)
nRBC: 0 % (ref 0.0–0.2)

## 2024-01-09 MED ORDER — DIPHENHYDRAMINE HCL 50 MG/ML IJ SOLN
50.0000 mg | Freq: Once | INTRAMUSCULAR | Status: AC
  Administered 2024-01-09: 50 mg via INTRAVENOUS
  Filled 2024-01-09: qty 1

## 2024-01-09 MED ORDER — PROCHLORPERAZINE EDISYLATE 10 MG/2ML IJ SOLN
10.0000 mg | Freq: Once | INTRAMUSCULAR | Status: AC
  Administered 2024-01-09: 10 mg via INTRAVENOUS
  Filled 2024-01-09: qty 2

## 2024-01-09 MED ORDER — IOHEXOL 350 MG/ML SOLN
100.0000 mL | Freq: Once | INTRAVENOUS | Status: AC | PRN
  Administered 2024-01-09: 75 mL via INTRAVENOUS

## 2024-01-09 MED ORDER — PROCHLORPERAZINE MALEATE 10 MG PO TABS
10.0000 mg | ORAL_TABLET | Freq: Three times a day (TID) | ORAL | 0 refills | Status: DC | PRN
  Filled 2024-01-09: qty 15, 5d supply, fill #0

## 2024-01-09 NOTE — ED Triage Notes (Signed)
Pt recently had AVM surgery.  Pt was started on Gabapentin and dose increased on 1/20.  Pt stated to have N/V.  Pt stopped taking it on 28th but continues to have N/V

## 2024-01-09 NOTE — ED Provider Notes (Signed)
[facility] Provider Note   CSN: [id number] Arrival date & time: 01/09/24  4098     History  Chief Complaint  Patient presents with   Medication Reaction    Wayne Lee is a 48 y.o. male.  The history is provided by the patient, the spouse and medical records. No language interpreter was used.  Dizziness Quality:  Head spinning and room spinning Severity:  Severe Onset quality:  Gradual Duration:  10 days Timing:  Intermittent Progression:  Waxing and waning Chronicity:  Recurrent Relieved by:  Nothing Worsened by:  Movement Ineffective treatments:  None tried Associated symptoms: headaches, nausea and vomiting   Associated symptoms: no chest pain, no diarrhea, no palpitations, no shortness of breath, no syncope, no vision changes and no weakness        Home Medications Prior to Admission medications   Medication Sig Start Date End Date Taking? Authorizing Provider  acetaminophen (TYLENOL) 325 MG tablet Take 2 tablets (650 mg total) by mouth 4 (four) times daily. Patient not taking: Reported on 01/03/2024 12/30/23   Jacquelynn Cree, PA-C  atorvastatin (LIPITOR) 20 MG tablet Take 1 tablet (20 mg total) by mouth daily. Patient not taking: Reported on 01/03/2024 12/21/23   Azucena Fallen, MD  diclofenac Sodium (VOLTAREN) 1 % GEL Apply 2 g topically 4 (four) times daily. To neck and shoulders Patient not taking: Reported on 01/03/2024 12/30/23   Love, Evlyn Kanner, PA-C  gabapentin (NEURONTIN) 300 MG capsule Take 1 capsule (300 mg total) by mouth 3 (three) times daily. Patient not taking: Reported on 01/07/2024 12/31/23   Love, Evlyn Kanner, PA-C  melatonin 5 MG TABS Take 2 tablets (10 mg total) by mouth at bedtime. Patient not taking: Reported on 01/03/2024 12/31/23   Love, Evlyn Kanner, PA-C  Menthol-Methyl Salicylate (MUSCLE RUB) 10-15 % CREA Apply 1 Application topically as needed for muscle pain. To anterior shins Patient not taking:  Reported on 01/07/2024 12/30/23   Love, Evlyn Kanner, PA-C  methocarbamol (ROBAXIN) 750 MG tablet Take 1 tablet (750 mg total) by mouth every 6 (six) hours as needed for muscle spasms (muscle soreness, tightness). Patient not taking: Reported on 01/07/2024 12/30/23   Jacquelynn Cree, PA-C  polyvinyl alcohol (LIQUIFILM TEARS) 1.4 % ophthalmic solution Place 2 drops into both eyes as needed for dry eyes. Patient not taking: Reported on 01/03/2024 12/30/23   Love, Evlyn Kanner, PA-C  senna-docusate (SENOKOT-S) 8.6-50 MG tablet Take 2 tablets by mouth daily at 6 (six) AM. Patient not taking: Reported on 01/07/2024 12/30/23   Love, Evlyn Kanner, PA-C  topiramate (TOPAMAX) 50 MG tablet Take 1 tablet (50 mg total) by mouth 2 (two) times daily. 12/31/23   Love, Evlyn Kanner, PA-C  traMADol (ULTRAM) 50 MG tablet Take 1 tablet (50 mg total) by mouth every 6 (six) hours as needed. 12/30/23   Love, Evlyn Kanner, PA-C  triamcinolone (KENALOG) 0.025 % ointment Apply 1 Application topically 2 (two) times daily as needed (Dermatitis). Patient not taking: Reported on 01/03/2024 10/30/23   [provider]      Allergies    Penicillins    Review of Systems   Review of Systems  Constitutional:  Negative for chills, fatigue and fever.  HENT:  Negative for congestion.   Eyes:  Positive for photophobia. Negative for pain and visual disturbance.  Respiratory:  Negative for cough, chest tightness and shortness of breath.   Cardiovascular:  Negative for chest pain, palpitations and syncope.  Gastrointestinal:  Positive for nausea and vomiting. Negative for abdominal pain, constipation and diarrhea.  Genitourinary:  Negative for dysuria, flank pain and frequency.  Musculoskeletal:  Positive for neck pain. Negative for back pain and neck stiffness.  Skin:  Positive for wound (ell appearing neck surgical wound). Negative for rash.  Neurological:  Positive for dizziness and headaches. Negative for syncope, speech difficulty, weakness,  light-headedness and numbness.  Psychiatric/Behavioral:  Negative for agitation and confusion.   All other systems reviewed and are negative.   Physical Exam Updated Vital Signs BP 127/81   Pulse 66   Temp (!) 97.5 F (36.4 C) (Oral)   Resp 14   Ht 5\' 8"  (1.727 m)   Wt 103.9 kg   SpO2 99%   BMI 34.82 kg/m  Physical Exam Vitals and nursing note reviewed.  Constitutional:      General: He is not in acute distress.    Appearance: He is well-developed. He is not ill-appearing, toxic-appearing or diaphoretic.  HENT:     Head: Normocephalic and atraumatic.     Nose: No congestion or rhinorrhea.     Mouth/Throat:     Mouth: Mucous membranes are moist.     Pharynx: No oropharyngeal exudate or posterior oropharyngeal erythema.  Eyes:     General: No scleral icterus.    Extraocular Movements: Extraocular movements intact.     Right eye: Nystagmus present.     Left eye: Nystagmus present.     Conjunctiva/sclera: Conjunctivae normal.     Comments: Mild nystagmus with rightward gaze.  No vision changes reported.  Cardiovascular:     Rate and Rhythm: Normal rate and regular rhythm.     Heart sounds: No murmur heard. Pulmonary:     Effort: Pulmonary effort is normal. No respiratory distress.     Breath sounds: Normal breath sounds. No wheezing, rhonchi or rales.  Chest:     Chest wall: No tenderness.  Abdominal:     General: Abdomen is flat.     Palpations: Abdomen is soft.     Tenderness: There is no abdominal tenderness. There is no right CVA tenderness, left CVA tenderness, guarding or rebound.  Musculoskeletal:        General: Tenderness present. No swelling.     Cervical back: Neck supple. Tenderness present.     Right lower leg: No edema.     Left lower leg: No edema.  Skin:    General: Skin is warm and dry.     Capillary Refill: Capillary refill takes less than 2 seconds.     Findings: No erythema or rash.  Neurological:     Mental Status: He is alert.     Sensory:  No sensory deficit.     Motor: No weakness.     Coordination: Coordination abnormal.     Comments: Mild left arm ataxia compared to right.  Intact sensation and strength in all extremities.  Symmetric smile.  Clear speech.  Pupils symmetric and reactive.  Mild nystagmus to the right side both eyes.  No carotid bruit.  Mild tenderness in the axillary of the neck but wound well-appearing but no drainage or bleeding seen.  Psychiatric:        Mood and Affect: Mood normal.     ED Results / Procedures / Treatments   Labs (all labs ordered are listed, but only abnormal results are displayed) Labs Reviewed  CBC WITH DIFFERENTIAL/PLATELET - Abnormal; Notable for the following components:      Result Value  WBC 3.9 (*)    All other components within normal limits  COMPREHENSIVE METABOLIC PANEL - Abnormal; Notable for the following components:   ALT 75 (*)    All other components within normal limits    EKG EKG Interpretation Date/Time:  Thursday January 09 2024 08:52:18 EST Ventricular Rate:  69 PR Interval:  133 QRS Duration:  88 QT Interval:  382 QTC Calculation: 410 R Axis:   88  Text Interpretation: Sinus rhythm ST elev, probable normal early repol pattern when compard to prior, overall similar appearance. No STEMI Confirmed by Theda Belfast ([id number]) on 01/09/2024 8:58:09 AM  Radiology CT ANGIO HEAD NECK W WO CM Result Date: 01/09/2024 CLINICAL DATA:  Vertigo, recent intracerebral hemorrhage and surgery, worsening nausea, vomiting, and dizziness EXAM: CT ANGIOGRAPHY HEAD AND NECK WITH AND WITHOUT CONTRAST TECHNIQUE: Multidetector CT imaging of the head and neck was performed using the standard protocol during bolus administration of intravenous contrast. Multiplanar CT image reconstructions and MIPs were obtained to evaluate the vascular anatomy. Carotid stenosis measurements (when applicable) are obtained utilizing NASCET criteria, using the distal internal carotid diameter as the  denominator. RADIATION DOSE REDUCTION: This exam was performed according to the departmental dose-optimization program which includes automated exposure control, adjustment of the mA and/or kV according to patient size and/or use of iterative reconstruction technique. CONTRAST:  75mL OMNIPAQUE IOHEXOL 350 MG/ML SOLN COMPARISON:  12/16/2023 CTA head and neck, 12/18/2023 CT head FINDINGS: CT HEAD FINDINGS Brain: Postoperative changes in the occipital bone and posterior fossa, without residual or new hyperdense hemorrhage. Prominent extra-axial space along the left aspect of the cerebellum may reflect a subdural collection versus cerebellar volume loss. The collection measures up to 7 mm (series 601, image 25) and does not cause significant mass effect on the cerebellum. No evidence of acute infarct or hemorrhage. No mass, mass effect, or midline shift. The ventricles are within normal limits for size, but appear minimally larger than on 12/18/2023; for example, the third ventricle measures up to 5 mm (series 2, image 15), previously 3 mm. Partial empty sella. Normal craniocervical junction. Normal mamillopontine distance. No evidence of active extravasation into the resection cavity on postcontrast imaging. Vascular: No hyperdense vessel. Skull: Left paracentral suboccipital craniectomy. 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 This has increased in size since 12/18/2023 Sinuses/Orbits: No acute finding. Status post left lens replacement. Other: The mastoid air cells are well aerated. CTA NECK FINDINGS Aortic arch: Two-vessel arch with a common origin of the brachiocephalic and left common carotid arteries. Imaged portion shows no evidence of aneurysm or dissection. No significant stenosis of the major arch vessel  origins. Right carotid system: No evidence of dissection, occlusion, or hemodynamically significant stenosis (greater than 50%). Left carotid system: No evidence of dissection, occlusion, or hemodynamically significant stenosis (greater than 50%). Vertebral arteries: No evidence of dissection, occlusion, or hemodynamically significant stenosis (greater than 50%). Skeleton: No acute osseous abnormality. Other neck: The pseudomeningocele extends into the posterior neck soft tissues to the level of the C2 spinous process (series 604, image 127). On precontrast imaging, some hyperdensities are noted around the periphery of the collection, which do not resemble hemorrhage; however, hyperdensity is seen in the part of the collection that was not imaged on precontrast imaging, which is indeterminate for hemorrhage versus postsurgical changes (series 605,  image 193). No definite peripheral enhancement around the collection to suggest abscess. Upper chest: No focal pulmonary opacity or pleural effusion. Review of the MIP images confirms the above findings CTA HEAD FINDINGS Anterior circulation: Both internal carotid arteries are patent to the termini, without significant stenosis. A1 segments patent. Normal anterior communicating artery. Anterior cerebral arteries are patent to their distal aspects without significant stenosis. No M1 stenosis or occlusion. MCA branches perfused to their distal aspects without significant stenosis. Posterior circulation: Vertebral arteries patent to the vertebrobasilar junction without significant stenosis. Posterior inferior cerebellar arteries patent proximally. Basilar patent to its distal aspect without significant stenosis. Superior cerebellar arteries patent proximally. Patent P1 segments. PCAs perfused to their distal aspects without significant stenosis. Fetal PCA from the right posterior communicating artery is in parallel with a conventional but somewhat diminutive right PCA. Venous  sinuses: Well opacified, patent. Anatomic variants: Fetal PCA in parallel with a conventional right PCA. No evidence of aneurysm or vascular malformation. Review of the MIP images confirms the above findings IMPRESSION: 1. Postoperative changes in the posterior fossa, without residual or new hyperdense hemorrhage. No evidence of active extravasation into the resection cavity. 2. 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 This has has increased in size since 12/18/2023. On precontrast imaging, some hyperdensities are noted around the periphery of the collection, which do not resemble hemorrhage; however, hyperdensity is seen in the part of the collection that was not imaged on precontrast imaging, which is indeterminate for hemorrhage versus postsurgical changes. No definite peripheral enhancement around the collection to suggest abscess. No findings suggestive of intracranial hypotension. 3. Prominent extra-axial space along the left aspect of the cerebellum may reflect a subdural collection versus cerebellar volume loss. The collection measures up to 7 mm and does not cause significant mass effect on the cerebellum. 4. The ventricles are within normal limits for size, but appear minimally larger than on 12/18/2023. Attention on follow-up. 5. No intracranial large vessel occlusion or significant stenosis. 6. No hemodynamically significant stenosis in the neck. Electronically Signed   By: Wiliam Ke M.D.   On: 01/09/2024 12:23    Procedures Procedures    Medications Ordered in ED Medications  prochlorperazine (COMPAZINE) injection 10 mg (10 mg Intravenous Given 01/09/24 0947)  diphenhydrAMINE (BENADRYL) injection 50 mg (50 mg Intravenous Given 01/09/24 0948)  iohexol (OMNIPAQUE) 350 MG/ML injection 100 mL (75 mLs Intravenous Contrast Given 01/09/24 1119)    ED  Course/ Medical Decision Making/ A&P                                 Medical Decision Making Amount and/or Complexity of Data Reviewed Labs: ordered. Radiology: ordered.  Risk Prescription drug management.    Wayne Lee is a 48 y.o. 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  Patient reports that he has had headaches since the procedure but is still been persistent in his head and upper neck.  He reports for the last 10 days had worsening nausea and vomiting with any movement or in the mornings.  He reports he has tried different medications have not seemed to help.  He got to the point where he needed reassessment  this morning.  He denies any vision changes or speech difficulties.  Denies any new numbness tingling or weakness in extremities or face.  The only symptoms he has left over is the left arm ataxia but otherwise he has been doing fairly well.  He denies any fall or trauma and denies any fevers or chills.  Denies any constipation, diarrhea, or urinary changes.  Denies any other complaints.  On exam, lungs clear.  Chest nontender.  Abdomen nontender.  Intact sensation and strength in arms and legs.  Symmetric smile.  Clear speech.  Pupils symmetric and reactive.  He had mild nystagmus with rightward gaze.  He denies any vision changes.  He had no neglect.  Mild nystagmus with left finger-nose-finger testing compared to right but otherwise exam reassuring.  Due to his recent surgery and recent hemorrhagic stroke, I spoke to Dr. Amada Jupiter with neurology to discuss what imaging would be best suited to further rule out recurrent bleed, edema worsening, or other intracranial changes.  He recommended getting CTA head and neck and then reassess.  We will then determine if we need to transfer for MRI or  patient has some ability admission.  He agreed with a headache cocktail and some basic labs.  Anticipate reassessment after workup.     CTA did not show evidence of intracranial hemorrhage but did show a pseudomeningocele.  I spoke with neurology who recommended reaching out to [facility] with neurosurgery which I did.  I spoke with [facility] who felt this is appropriate for outpatient follow-up and they agreed with giving some different medicine to help with nausea and headache.  Will send prescription for Compazine as that seem to help him somewhat today and he will follow-up with him next week.  Neurosurgeon do not feel he needed other intervention or MRI today.  Patient family agree with plan of care and will discharge for outpatient follow-up.        Final Clinical Impression(s) / ED Diagnoses Final diagnoses:  Acute nonintractable headache, unspecified headache type  Nausea and vomiting, unspecified vomiting type  Dizziness    Rx / DC Orders ED Discharge Orders          Ordered    prochlorperazine (COMPAZINE) 10 MG tablet  Every 8 hours PRN        01/09/24 1412           Clinical Impression: 1. Acute nonintractable headache, unspecified headache type   2. Nausea and vomiting, unspecified vomiting type   3. Dizziness     Disposition: Discharge  Condition: Good  I have discussed the results, Dx and Tx plan with the pt(& family if present). He/she/they expressed understanding and agree(s) with the plan. Discharge instructions discussed at great length. Strict return precautions discussed and pt &/or family have verbalized understanding of the instructions. No further questions at time of discharge.    New Prescriptions   PROCHLORPERAZINE (COMPAZINE) 10 MG TABLET    Take 1 tablet (10 mg total) by mouth every 8 (eight) hours as needed for nausea or vomiting.    Follow Up: Barnett Abu, MD [address] [place] [address] [phone]     [facility] Emergency Department at [facility] [address] [place] [address] [phone]        Emili Mcloughlin, Canary Brim, MD 01/09/24 [phone]

## 2024-01-09 NOTE — Discharge Instructions (Signed)
Your history, exam, workup today did not show evidence of new hemorrhage or other changes that would require admission or surgical intervention at this time.  I spoke with both neurology and neurosurgery who felt you are stable for discharge home with some different nausea medication to help with symptoms and follow-up with them in clinic next week.  Please rest and stay hydrated and continue other medications.  If any symptoms change or worsen acutely, please return to the nearest emergency department.

## 2024-01-09 NOTE — ED Notes (Signed)
Patient transported to CT

## 2024-01-13 ENCOUNTER — Encounter: Payer: 59 | Attending: Physical Medicine and Rehabilitation | Admitting: Physical Medicine and Rehabilitation

## 2024-01-13 VITALS — BP 130/97 | HR 101 | Ht 68.0 in | Wt 222.0 lb

## 2024-01-13 DIAGNOSIS — I619 Nontraumatic intracerebral hemorrhage, unspecified: Secondary | ICD-10-CM | POA: Diagnosis present

## 2024-01-13 DIAGNOSIS — H814 Vertigo of central origin: Secondary | ICD-10-CM | POA: Insufficient documentation

## 2024-01-13 DIAGNOSIS — R112 Nausea with vomiting, unspecified: Secondary | ICD-10-CM | POA: Diagnosis present

## 2024-01-13 DIAGNOSIS — Z7409 Other reduced mobility: Secondary | ICD-10-CM | POA: Diagnosis present

## 2024-01-13 DIAGNOSIS — Z789 Other specified health status: Secondary | ICD-10-CM | POA: Insufficient documentation

## 2024-01-13 DIAGNOSIS — R519 Headache, unspecified: Secondary | ICD-10-CM | POA: Insufficient documentation

## 2024-01-13 MED ORDER — SCOPOLAMINE 1 MG/3DAYS TD PT72: 1.0000 | MEDICATED_PATCH | TRANSDERMAL | 2 refills | Status: DC

## 2024-01-13 MED ORDER — ACETAMINOPHEN 325 MG PO TABS: 650.0000 mg | ORAL_TABLET | Freq: Four times a day (QID) | ORAL | Status: AC

## 2024-01-13 MED ORDER — PROCHLORPERAZINE MALEATE 5 MG PO TABS: 5.0000 mg | ORAL_TABLET | Freq: Three times a day (TID) | ORAL | 1 refills | Status: DC

## 2024-01-13 MED ORDER — ATORVASTATIN CALCIUM 20 MG PO TABS: 20.0000 mg | ORAL_TABLET | Freq: Every day | ORAL | 0 refills | Status: DC

## 2024-01-13 MED ORDER — MECLIZINE HCL 50 MG PO TABS: 25.0000 mg | ORAL_TABLET | Freq: Three times a day (TID) | ORAL | 2 refills | Status: DC | PRN

## 2024-01-13 NOTE — Therapy (Signed)
 OUTPATIENT PHYSICAL THERAPY NEURO TREATMENT   Patient Name: Wayne Lee MRN: [id number] DOB:[birth year], 48 y.o., male Today's Date: 01/14/2024   PCP: No PCP REFERRING PROVIDER: Sharlet Schmitz, PA-C  END OF SESSION:  PT End of Session - 01/14/24 1025     Visit Number 2    Number of Visits 16    Date for PT Re-Evaluation 03/27/24    Authorization Type [employer]    PT Start Time 1025    PT Stop Time 1115    PT Time Calculation (min) 50 min    Activity Tolerance Treatment limited secondary to medical complications (Comment)   increased headache pain, laid down a lot t/o eval, vomiting at end   Behavior During Therapy [facility] for tasks assessed/performed              Past Medical History:  Diagnosis Date   Atopic dermatitis in adult    Left cataract    Obesity (BMI 30-39.9)    Stroke [facility])    Undescended left testicle    had surgical intervention   Past Surgical History:  Procedure Laterality Date   CATARACT EXTRACTION W/ INTRAOCULAR LENS IMPLANT Left 2017   CHOLECYSTECTOMY     ORCHIOPEXY  1984   SUBOCCIPITAL CRANIECTOMY CERVICAL LAMINECTOMY N/A 12/16/2023   Procedure: SUBOCCIPITAL CRANIECTOMY;  Surgeon: Colon Shove, MD;  Location: [facility] OR;  Service: Neurosurgery;  Laterality: N/A;   Patient Active Problem List   Diagnosis Date Noted   Nausea and vomiting 01/13/2024   Central nervous system origin vertigo, unspecified laterality 01/13/2024   Impaired mobility and ADLs 01/13/2024   Bilateral headaches 12/31/2023   ICH (intracerebral hemorrhage) (HCC) 12/20/2023   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   Hypertension 12/19/2023   Brain compression (HCC) 12/19/2023   Nontraumatic acute cerebral hemorrhage (HCC) 12/16/2023   Cerebellar hemorrhage (HCC) 12/16/2023    ONSET DATE: 12/15/22  REFERRING DIAG:  Diagnosis  I61.9 (ICD-10-CM) - Nontraumatic acute cerebral hemorrhage (HCC)    THERAPY DIAG:  Other abnormalities of gait and  mobility  Visuospatial deficit  Muscle weakness (generalized)  Other lack of coordination  Nontraumatic acute cerebral hemorrhage (HCC)  Difficulty in walking, not elsewhere classified  Dizziness and giddiness  Rationale for Evaluation and Treatment: Rehabilitation  SUBJECTIVE:                                                                                                                                                                                             SUBJECTIVE STATEMENT: I have been throwing up a lot. 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   Pt accompanied  by: significant other  PERTINENT HISTORY: 1/6 stroke, ICU and IRF leaving hospital 1/21  PAIN:  Are you having pain? Yes: NPRS scale: 0/10 Pain location: occiput Pain description: aching, pulsing Aggravating factors: positional changes Relieving factors: medicine, stretching neck and shoulders, ice/heat  PRECAUTIONS: None  RED FLAGS: None   WEIGHT BEARING RESTRICTIONS: No  FALLS: Has patient fallen in last 6 months? No  LIVING ENVIRONMENT: Lives with: lives with their family and lives with their spouse Lives in: House/apartment Stairs: Yes: Internal: 15 steps; can reach both and External: 2 steps; none front door 4 steps rails on both sides Has following equipment at home: None  PLOF:  supervision for I/ADLs  PATIENT GOALS: walking, energy, endurance  OBJECTIVE:  Note: Objective measures were completed at Evaluation unless otherwise noted.  DIAGNOSTIC FINDINGS:  Nothing new  COGNITION: Overall cognitive status: Within functional limits for tasks assessed   SENSATION: WFL  COORDINATION: Toe taps and heel-to-shin normal  EDEMA:  None  MUSCLE TONE: RLE: Within functional limits  MUSCLE LENGTH: NT  POSTURE:  very wide BOS   LOWER EXTREMITY ROM:   WFL   Active  Right Eval Left Eval  Hip flexion    Hip extension    Hip abduction    Hip  adduction    Hip internal rotation    Hip external rotation    Knee flexion    Knee extension    Ankle dorsiflexion    Ankle plantarflexion    Ankle inversion    Ankle eversion     (Blank rows = not tested)  LOWER EXTREMITY MMT:    MMT Right Eval Left Eval  Hip flexion 5/5 5/5  Hip extension    Hip abduction 5/5 5/5  Hip adduction 5/5 5/5  Hip internal rotation    Hip external rotation    Knee flexion 5/5 5/5  Knee extension 5/5 5/5  Ankle dorsiflexion    Ankle plantarflexion    Ankle inversion    Ankle eversion    (Blank rows = not tested)  BED MOBILITY:  Sit to supine Complete Independence and Modified independence Supine to sit Modified independence Rolling to Right Complete Independence Rolling to Left Complete Independence  TRANSFERS: Assistive device utilized: None  Sit to stand: Modified independence Stand to sit: Complete Independence Chair to chair: Modified independence Floor:  NT   STAIRS: Level of Assistance:  NT Stair Negotiation Technique:  with  Number of Stairs:   Height of Stairs:   Comments: NT  GAIT: Gait pattern: wide BOS, R lateral lean using wall to balance on the way back to room Distance walked: 50 ft Assistive device utilized: None Level of assistance: Min A with HHA from wife (pt did not want to, but was leaning) Comments: could potentially use AD  FUNCTIONAL TESTS:  5 times sit to stand: 9.26 seconds Berg Balance Scale: 48/56  PATIENT SURVEYS:  NT today  TREATMENT DATE:  01/14/24 Seated march 20 reps alt  LAQ x10 each side  VOR x1 horizontal and vertical seated VOR x2 horizontal and vertical seated Feet together, EC feet together  Romberg stance, tandem standing 30s  SLS 10s  Standing on airex feet together Standing on airex head turns  On airex playing catch   01/03/24    PATIENT  EDUCATION: Education details: Diagnosis, Prognosis, POC, holding off on HEP Person educated: Patient and Spouse Education method: Explanation, Demonstration, Tactile cues, and Verbal cues Education comprehension: verbalized understanding, returned demonstration, verbal cues required, tactile cues required, and needs further education  HOME EXERCISE PROGRAM: TBD  GOALS: Goals reviewed with patient? No  SHORT TERM GOALS: Target date: 01/25/24  Initial HEP will be prescribed by 3rd visit Baseline: not provided  Goal status: INITIAL  2.  Headache pain will not increase above 7/10 during session.  Baseline: started at 7/10, pt verbalized it was worse at end of eval  Goal status: INITIAL  3.  Activity increase will not elicit vomiting. Baseline: vomited at end of eval after BERG testing. Goal status: INITIAL   LONG TERM GOALS: Target date: 03/09/24  Pt will be independent with advanced HEP, as prescribed throughout POC. Baseline: not prescribed yet Goal status: INITIAL  2.  Pt will decrease HA pain to </= 4/10 with activity.  Baseline: > 7/10 Goal status: INITIAL  3.  Pt will increase BERG balance test score to >/= 52/56. Baseline: 48/56 Goal status: INITIAL  4.  Pt will complete full session without vomiting or any other adverse reactions.  Baseline: vomited after eval Goal status: INITIAL    ASSESSMENT:  CLINICAL IMPRESSION: Patient is a 48 y.o. male who was seen today for physical therapy treatment for stroke related symptoms and post suboccipital craniectomy.  Pt spent a week in inpatient rehab at [facility] and was d/c on 12/31/23. He returned to the ED for increased headaches and N/V/D on 01/09/24. Today he is feeling much better and was able to tolerate more activity. We worked on some gentle vestibular ocular introduction and neuromuscular red-ed. Patient has increased sway on airex pad and with eyes closed he loses balance and need assistance to prevent falling. He  also had LOB stepping off airex pad backwards. Need help to get into tandem stance but is able to hold once in the position. He would benefit from skilled PT 1-2x/week for 8 weeks to slowly rebuild endurance, brain/body connection, activity tolerance, improve gait mechanics, and balance.   OBJECTIVE IMPAIRMENTS: decreased activity tolerance, decreased balance, difficulty walking, decreased safety awareness, impaired perceived functional ability, increased muscle spasms, and pain.   ACTIVITY LIMITATIONS: standing, stairs, and locomotion level  PARTICIPATION LIMITATIONS: cleaning, laundry, driving, shopping, community activity, and occupation  PERSONAL FACTORS: 1 comorbidity: history of stroke  are also affecting patient's functional outcome.   REHAB POTENTIAL: Good  CLINICAL DECISION MAKING: Evolving/moderate complexity  EVALUATION COMPLEXITY: Moderate  PLAN:  PT FREQUENCY: 1-2x/week  PT DURATION: 8 weeks  PLANNED INTERVENTIONS: 97164- PT Re-evaluation, 97110-Therapeutic exercises, 97530- Therapeutic activity, 97112- Neuromuscular re-education, 97535- Self Care, 02859- Manual therapy, and 97116- Gait training  PLAN FOR NEXT SESSION: Prescribe HEP, slow progression of balance, NMR, rest breaks to address overstimulation, consider DGI or more advanced test when pt is appropriate   Almetta Fam, PT, DPT 01/14/2024, 11:19 AM

## 2024-01-13 NOTE — Patient Instructions (Addendum)
Start scopolamine patch, 1 patch every 72 hours, for ongoing symptoms of dizziness and vomiting.  I am also prescribing meclizine 25 mg 3 times daily as needed for breakthrough dizziness and nausea; you can use up to 100 mg/day.  Use Tylenol and tramadol as needed for headache control.  Stop Topamax for now, if headaches worsen notify me and I will renew this medication.  Reduce Compazine to 5 mg and take 30 minutes to 1 hour before meals, to help tolerate eating and drinking.  Stick to bland and low fiber foods to reduce nausea/vomiting.  Work hard on increasing fluid intakes to at least 1-1/2 to 2 L/day.  This will help with your symptoms.  Call or message me through MyChart in the next 1 to 2 weeks to let me know how symptoms are progressing, or if you are having any side effects to current medications.  I did refill your atorvastatin today, further medication refills will be done by your primary care doctor.  Any medications not mentioned above, stop.  Follow-up with me in 4 weeks.

## 2024-01-13 NOTE — Progress Notes (Unsigned)
Subjective:    Patient ID: Wayne Lee, male    DOB: [birth year], 48 y.o.   MRN: [id number]  HPI   Wayne Lee is a 48 y.o. year old male  who  has a past medical history of Atopic dermatitis in adult, Left cataract, Obesity (BMI 30-39.9), Stroke (HCC), and Undescended left testicle.   They are presenting to PM&R clinic for follow up related to *** .   Interval Hx:  - Therapies: Has been unable to participate completely because of nausea. He is very limited in walking, can't bathe or do cooking/cleaning ADLs because symptoms are so severe. Can do steps into the home with assistance, and can brush his teeth with a chair in place. Can walk to/from the parking lot.    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 I spoke with neurology who recommended reaching out to [facility] with neurosurgery which I did. I spoke with Wayne Lee who felt this is appropriate for outpatient follow-up and they agreed with giving some different medicine to help with nausea and headache. Will send prescription for Compazine as that seem to help him somewhat today and he will follow-up with him next week. Neurosurgeon do not feel he needed other intervention or MRI today.  Followed up with Dr. Danielle Lee, no repeat imaging, is seeing him again on Thursday.    - Falls: none; pulls to the left a bit but does well with wife holding his hand.    - DME: Using a chair at the sink to assist; otherwise none.    - Medications: Was sent home with compazine; took 4 pills and was not helpful.   Stopped everything except Topomax yesterday; Did not notice any difference in his symptoms. Continues to have headaches. 7/10 - 8/10, worse around the incision site if he is laying on his back; but diffuse overall, present in all other positions and never stops. + photophobia, worse with looking at phones, talking to people.    - Other concerns: "It got to  the point where if I walk 15 feet, I vomit. I can't eat anything, I really can't drink a lot. As soon as I eat, it comes back." Has not vomitted today, took kids to school, which is an improvement. Still feeling ill.   Days and nights are currently flipped from when he was seen in the ED; otherwise says he is sleeping well, will try to stay up today to flip back.   Notes his BP has been low 101/59 at home, does get high when he is vomiting.   Started having frequent hiccups since discharge from rehab, not intractable.   Pain Inventory Average Pain 8 Pain Right Now 7 My pain is sharp, aching, and pressure  In the last 24 hours, has pain interfered with the following? General activity 3 Relation with others 0 Enjoyment of life 0 What TIME of day is your pain at its worst? varies Sleep (in general) Fair  Pain is worse with: unsure Pain improves with: medication and massage and lying in the right postition Relief from Meds: 7  ability to climb steps?  yes do you drive?  no  employed # of hrs/week out on leave I need assistance with the following:  household duties and shopping  trouble walking dizziness confusion  TC appt  Any changes since last visit?  yes ED visit on Thursday 01/09/24    Family History  Problem Relation Age of Onset   Heart Problems Mother    Cancer Father    Cancer Maternal Grandfather    Social History   Socioeconomic History   Marital status: Married    Spouse name: Not on file   Number of children: Not on file   Years of education: Not on file   Highest education level: Not on file  Occupational History   Not on file  Tobacco Use   Smoking status: Never   Smokeless tobacco: Never  Substance and Sexual Activity   Alcohol use: Never   Drug use: Never   Sexual activity: Not on file  Other Topics Concern   Not on file  Social History Narrative   Not on file   Social Drivers of Health   Financial Resource Strain: Low Risk   (01/01/2024)   Received from [facility]   Overall Financial Resource Strain (CARDIA)    Difficulty of Paying Living Expenses: Not hard at all  Food Insecurity: No Food Insecurity (01/01/2024)   Received from [facility]   Hunger Vital Sign    Worried About Running Out of Food in the Last Year: Never true    Ran Out of Food in the Last Year: Never true  Transportation Needs: No Transportation Needs (01/01/2024)   Received from [facility] - Transportation    Lack of Transportation (Medical): No    Lack of Transportation (Non-Medical): No  Physical Activity: Unknown (09/02/2023)   Received [facility]   Exercise Vital Sign    Days of Exercise per Week: 0 days    Minutes of Exercise per Session: Not on file  Stress: No Stress Concern Present (09/02/2023)   Received from [facility] - Occupational Stress Questionnaire    Feeling of Stress : Not at all  Social Connections: Socially Integrated (09/02/2023)   Received from [facility]   Social Network    How would you rate your social network (family, work, friends)?: Good participation with social networks   Past Surgical History:  Procedure Laterality Date   CATARACT EXTRACTION W/ INTRAOCULAR LENS IMPLANT Left 2017   CHOLECYSTECTOMY     ORCHIOPEXY  1984   SUBOCCIPITAL CRANIECTOMY CERVICAL LAMINECTOMY N/A 12/16/2023   Procedure: SUBOCCIPITAL CRANIECTOMY;  Surgeon: Wayne Abu, MD;  Location: [facility] OR;  Service: Neurosurgery;  Laterality: N/A;   Past Medical History:  Diagnosis Date   Atopic dermatitis in adult    Left cataract    Obesity (BMI 30-39.9)    Stroke (HCC)    Undescended left testicle    had surgical intervention   BP (!) 130/97   Pulse (!) 101   Ht 5\' 8"  (1.727 m)   Wt 222 lb (100.7 kg)   SpO2 96%   BMI 33.75 kg/m   Opioid Risk Score:   Fall Risk Score:  `1  Depression screen [facility] 2/9     01/13/2024    8:42 AM  Depression screen PHQ 2/9   Decreased Interest 0  Down, Depressed, Hopeless 0  PHQ - 2 Score 0  Altered sleeping 2  Tired, decreased energy 2  Change in appetite 2  Feeling bad or failure about yourself  0  Trouble concentrating 1  Moving slowly or fidgety/restless 0  Suicidal thoughts 0  PHQ-9 Score 7  Difficult doing work/chores Very difficult     Review of Systems  Musculoskeletal:  Positive for gait problem.  Neurological:  Positive for  dizziness.  Psychiatric/Behavioral:  Positive for confusion.   All other systems reviewed and are negative.     Objective:   Physical Exam  PE: Constitution: Appropriate appearance for age. No apparent distress  +Obese Resp: No respiratory distress. No accessory muscle usage. {Blank multiple:19196::"***","on RA","CTAB","on *** L [place]"} Cardio: Well perfused appearance. *** No peripheral edema. Abdomen: Nondistended. Nontender.   Psych: Appropriate mood and affect. Neuro: AAOx4. No apparent cognitive deficits   Neurologic Exam:   DTRs: Reflexes were 2+ in bilateral achilles, patella, biceps, BR and triceps. Babinsky: flexor responses b/l.   Hoffmans: negative b/l Sensory exam: revealed normal sensation in all dermatomal regions in {Blank multiple:19196::"***","bilateral upper extremities","bilateral lower extremities","right upper extremity","left upper extremity","right lower extremity","left lower extremity","with reduced sensation to light touch in ***"} Motor exam: strength 5/5 throughout {Blank multiple:19196::"***","bilateral upper extremities","bilateral lower extremities","right upper extremity","left upper extremity","right lower extremity","left lower extremity","with exception of ***"} Coordination: Fine motor coordination was normal.   Gait: {Blank single:19197::"not observed due to safety concerns","normal","+trendelenberg","+antalgic gait","+foot drop","***"}   + slight rotary nystagmus with superior gaze.  Abdominal exam benign Episode of large amount  of yellow tinged vomit and hacking shortly after sitting up; symptoms resolved after approximately 5 minutes.     Assessment & Plan:   Wayne Lee is a 48 y.o. year old male  who  has a past medical history of Atopic dermatitis in adult, Left cataract, Obesity (BMI 30-39.9), Stroke (HCC), and Undescended left testicle.   They are presenting to PM&R clinic as a new patient for treatment of *** . They were referred by *** . Based on their presentation, *** .  Gave 50 mg meclizine and 1000 mg tylenol under supervision in clinic.  There are no diagnoses linked to this encounter.

## 2024-01-14 ENCOUNTER — Ambulatory Visit: Payer: 59 | Attending: Physical Medicine and Rehabilitation

## 2024-01-14 ENCOUNTER — Ambulatory Visit: Payer: 59 | Admitting: Occupational Therapy

## 2024-01-14 DIAGNOSIS — R42 Dizziness and giddiness: Secondary | ICD-10-CM | POA: Insufficient documentation

## 2024-01-14 DIAGNOSIS — R41842 Visuospatial deficit: Secondary | ICD-10-CM | POA: Diagnosis present

## 2024-01-14 DIAGNOSIS — I619 Nontraumatic intracerebral hemorrhage, unspecified: Secondary | ICD-10-CM | POA: Insufficient documentation

## 2024-01-14 DIAGNOSIS — R278 Other lack of coordination: Secondary | ICD-10-CM | POA: Insufficient documentation

## 2024-01-14 DIAGNOSIS — R2689 Other abnormalities of gait and mobility: Secondary | ICD-10-CM | POA: Diagnosis present

## 2024-01-14 DIAGNOSIS — M6281 Muscle weakness (generalized): Secondary | ICD-10-CM | POA: Diagnosis present

## 2024-01-14 DIAGNOSIS — R262 Difficulty in walking, not elsewhere classified: Secondary | ICD-10-CM | POA: Diagnosis present

## 2024-01-15 ENCOUNTER — Ambulatory Visit: Payer: 59 | Admitting: Occupational Therapy

## 2024-01-15 DIAGNOSIS — R278 Other lack of coordination: Secondary | ICD-10-CM

## 2024-01-15 DIAGNOSIS — R2689 Other abnormalities of gait and mobility: Secondary | ICD-10-CM | POA: Diagnosis not present

## 2024-01-15 DIAGNOSIS — M6281 Muscle weakness (generalized): Secondary | ICD-10-CM

## 2024-01-15 DIAGNOSIS — R41842 Visuospatial deficit: Secondary | ICD-10-CM

## 2024-01-15 NOTE — Therapy (Signed)
 OUTPATIENT OCCUPATIONAL THERAPY NEURO Treatment  Patient Name: Wayne Lee MRN: [id number] DOB:Sep 20, 1976, 48 y.o., male Today's Date: 01/16/2024  PCP: none REFERRING PROVIDER: Dr. Emeline  END OF SESSION:  OT End of Session - 01/16/24 1250     Visit Number 2    Number of Visits 25    Date for OT Re-Evaluation 03/31/24    Authorization Type aetna    Authorization Time Period 12 weeks    OT Start Time 1320    OT Stop Time 1400    OT Time Calculation (min) 40 min    Activity Tolerance Patient tolerated treatment well    Behavior During Therapy WFL for tasks assessed/performed   pt vomited end of session             Past Medical History:  Diagnosis Date   Atopic dermatitis in adult    Left cataract    Obesity (BMI 30-39.9)    Stroke [facility])    Undescended left testicle    had surgical intervention   Past Surgical History:  Procedure Laterality Date   CATARACT EXTRACTION W/ INTRAOCULAR LENS IMPLANT Left 2017   CHOLECYSTECTOMY     ORCHIOPEXY  1984   SUBOCCIPITAL CRANIECTOMY CERVICAL LAMINECTOMY N/A 12/16/2023   Procedure: SUBOCCIPITAL CRANIECTOMY;  Surgeon: Colon Shove, MD;  Location: [facility] OR;  Service: Neurosurgery;  Laterality: N/A;   Patient Active Problem List   Diagnosis Date Noted   Nausea and vomiting 01/13/2024   Central nervous system origin vertigo, unspecified laterality 01/13/2024   Impaired mobility and ADLs 01/13/2024   Bilateral headaches 12/31/2023   ICH (intracerebral hemorrhage) (HCC) 12/20/2023   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   Hypertension 12/19/2023   Brain compression (HCC) 12/19/2023   Nontraumatic acute cerebral hemorrhage (HCC) 12/16/2023   Cerebellar hemorrhage (HCC) 12/16/2023    ONSET DATE: 12/16/23  REFERRING DIAG: I61.9 (ICD-10-CM) - Nontraumatic acute cerebral hemorrhage (HCC)   THERAPY DIAG:  Visuospatial deficit  Muscle weakness (generalized)  Other lack of coordination  Rationale for Evaluation  and Treatment: Rehabilitation  SUBJECTIVE:   SUBJECTIVE STATEMENT: Pt reports  Pt accompanied by: significant other  PERTINENT HISTORY: 48 yo male arrival 1/6 with HTN 204/106 taken to emergent OR hematoma evacuation s/p suboccipital craniotomy and hypertonic saline started. Extubated 1/7 PMH obesity, sleep apnea, HLD d/c home 1/21/5 Per pt's wife AVM  CT Head without contrast: Large acute 5.3 cm intraparenchymal hemorrhage in the left cerebellum. Mass effect with narrowed fourth ventricle, but no hydrocephalus at this time. Basal cisterns are effaced and there is early ascending transtentorial herniation. PRECAUTIONS: Fall  WEIGHT BEARING RESTRICTIONS: No  PAIN:  Are you having pain? Yes: NPRS scale: 6/10 Pain location: headache Pain description: aching Aggravating factors: light sensitive Relieving factors: less light  FALLS: Has patient fallen in last 6 months? No  LIVING ENVIRONMENT: Lives with: lives with their family and lives with their spouse Lives in: House/apartment Stairs: yes  Has following equipment at home: None  PLOF: Independent  PATIENT GOALS: return to prior level  OBJECTIVE:  Note: Objective measures were completed at Evaluation unless otherwise noted.  HAND DOMINANCE: Right  ADLs: Overall ADLs: mod I-supervision with basic ADLS Transfers/ambulation related to ADLs: Eating: mod I Grooming: mod I UB Dressing: mod I LB Dressing: mod I Toileting: mod I Bathing: supervision, sits in bottom of bathtub Tub Shower transfers: supervision   IADLs:dependent for IADLS Medication management: wife is Furniture Conservator/restorer management: dependent   MOBILITY STATUS:  mod I-supervision  ACTIVITY TOLERANCE: Activity tolerance: limited by nausea   UPPER EXTREMITY ROM:  WFLS   UPPER EXTREMITY MMT:     MMT Right eval Left eval  Shoulder flexion 4+/5 4/5  Shoulder abduction    Shoulder adduction    Shoulder extension    Shoulder internal  rotation    Shoulder external rotation    Middle trapezius    Lower trapezius    Elbow flexion 4+/5 4+/5  Elbow extension 4+/5 4/5  Wrist flexion    Wrist extension    Wrist ulnar deviation    Wrist radial deviation    Wrist pronation    Wrist supination    (Blank rows = not tested)  HAND FUNCTION: Grip strength: Right: 63 lbs; Left: 48 lbs  COORDINATION: 9 Hole Peg test: Right: 26.25 sec; Left: 32.80 with several drops sec  SENSATION: WFL  COGNITION: Overall cognitive status:  recalls 3/3 words following short delay, spells WORLD backwards without difficulty  VISION: Subjective report: Pt reports double vision at times   VISION ASSESSMENT: To be further assessed in functional context Pt reports diplopia at times He was able to track to all 4 quadrants and visual fiels were intact. He became nauseous and vomited shortly after vision testing. Pt appears to have some vestibular issues.  Patient has difficulty with following activities due to following visual impairments: reading    OBSERVATIONS: Pleasant agreeable male, accompanied by his wife and 1 y.o son Pt's BP was elevated at end of session when he was vomiting 140/102, and 148/115, pt was instructed to monitor at home and to contact MD if diastolic BP does not reduce to below 100. pt's wife verbalized understanding                                                                                                                             TREATMENT DATE: 01/15/24-coordiantion HEP issued, see education  01/07/24 eval        PATIENT EDUCATION: Education details: coordination HEP, see pt instructions , recommendation for attending social events(superbowl party) to avoid becoming overwhelmed or dizzy, beginning return to work recommendations Person educated: Patient and Spouse Education method: Explanation, demonstration, handout, v.c Education comprehension: verbalized understanding, returned demosntration,    HOME EXERCISE PROGRAM: n/a   GOALS: Goals reviewed with patient? Yes  SHORT TERM GOALS: Target date: 02/06/24  I with HEP  Goal status: ongoing, coordination issued  2.  Pt will demonstrate improved fine motor coordination as evidenced by decreasing 9 hole peg test LUE to 29 secs or less without several drops.  Goal status: ongoing  3.  Pt will increase LUE grip strength to 52 lbs or greater for increased ease with daily activities.  Goal status: ongoing  4.  Pt will perfrom tabletop scanning wihout diplopia or vomiting with 90% or better accuracy.  Goal status: INITIAL  5.  Pt will perfrom transitional movments for ADLS/ IADLs without LOB or nausea greater than 3/10.  Goal status: INITIAL  6. I with adpaed strategies for ADLs/IADLs to improve safety and I.   Goal status: INITIAL  LONG TERM GOALS: Target date: 03/31/24  I with updated HEP  Goal status: INITIAL  2.  Pt will perform mod complex home management mod I  Goal status: INITIAL  3.  Pt will perform environmental scanning in a busy environment with 90% or better accuracy, without vomiting  Goal status: INITIAL  4.  Pt will perfrom simulated work activities mod I  Goal status: INITIAL    ASSESSMENT:  CLINICAL IMPRESSION: Pt is progressing towards goals. He has new medicine and was able to tolerate therapy without vomiting. He demonstrates understanding of coordination HEP and verbal instructions for putty exercises.  PERFORMANCE DEFICITS: in functional skills including ADLs, IADLs, coordination, dexterity, ROM, strength, Fine motor control, Gross motor control, balance, endurance, decreased knowledge of precautions, decreased knowledge of use of DME, vision, UE functional use, and vestibular, cognitive skills including safety awareness, and psychosocial skills including coping strategies, environmental adaptation, habits, interpersonal interactions, and routines and behaviors.   IMPAIRMENTS: are  limiting patient from ADLs, IADLs, rest and sleep, work, play, leisure, and social participation.   CO-MORBIDITIES: may have co-morbidities  that affects occupational performance. Patient will benefit from skilled OT to address above impairments and improve overall function.  MODIFICATION OR ASSISTANCE TO COMPLETE EVALUATION: Min-Moderate modification of tasks or assist with assess necessary to complete an evaluation.  OT OCCUPATIONAL PROFILE AND HISTORY: Detailed assessment: Review of records and additional review of physical, cognitive, psychosocial history related to current functional performance.  CLINICAL DECISION MAKING: LOW - limited treatment options, no task modification necessary  REHAB POTENTIAL: Good  EVALUATION COMPLEXITY: Low    PLAN:  OT FREQUENCY: 2x/week plus eval  OT DURATION: 12 weeks  PLANNED INTERVENTIONS: 97168 OT Re-evaluation, 97535 self care/ADL training, 02889 therapeutic exercise, 97530 therapeutic activity, 97112 neuromuscular re-education, 97140 manual therapy, 97116 gait training, 02886 aquatic therapy, 97035 ultrasound, 97018 paraffin, 02989 moist heat, 97010 cryotherapy, 97034 contrast bath, balance training, functional mobility training, visual/perceptual remediation/compensation, energy conservation, coping strategies training, patient/family education, and DME and/or AE instructions  RECOMMENDED OTHER SERVICES: PT  CONSULTED AND AGREED WITH PLAN OF CARE: Patient  PLAN FOR NEXT SESSION:  putty exercise, work towards goals.   Akera Snowberger, OT 01/16/2024, 12:58 PM

## 2024-01-15 NOTE — Patient Instructions (Addendum)
 Coordination Exercises  Perform the following exercises for 10 minutes 1 times per day. Perform with left hand(s). Perform using big movements.  Flipping Cards: Place deck of cards on the table. Flip cards over by opening your hand big to grasp and then turn your palm up  Deal cards: Hold 1/2 or whole deck in your hand. Use thumb to push card off top of deck with one big push. Rotate ball with fingertips: Pick up with fingers/thumb and move as much as you can with each turn/movement (clockwise and counter-clockwise). Toss ball from one hand to the other:  Toss ball in the air and catch with the same hand:  Rotate 2 golf balls in your hand: Both directions. Pick up coins and stack one at a time: Pick up with big, intentional movements. Do not drag coin to the edge. (5-10 in a stack) Pick up 5-10 coins one at a time and hold in palm. Then, move coins from palm to fingertips one at time and place in coin bank/container. Rotate stress balls in your hand   1. Grip Strengthening (Resistive Putty)   Squeeze putty using thumb and all fingers. Repeat _20___ times. Do __2__ sessions per day.   2. Roll putty into tube on table and pinch between   Roll putty into rope shape using all fingers held straight. Pinch: Palmar    Pinch putty with right thumb and each fingertip in turn. Repeat _10-20___ times. Do __1__ sessions per day. Activity: Peel fruit such as lemons or oranges.* Peel stickers off surfaces.  Copyright  VHI. All rights reserved.       Copyright  VHI. All rights reserved.

## 2024-01-16 ENCOUNTER — Ambulatory Visit: Payer: 59 | Admitting: Physical Therapy

## 2024-01-16 ENCOUNTER — Encounter: Payer: Self-pay | Admitting: Physical Therapy

## 2024-01-16 DIAGNOSIS — R262 Difficulty in walking, not elsewhere classified: Secondary | ICD-10-CM

## 2024-01-16 DIAGNOSIS — R2689 Other abnormalities of gait and mobility: Secondary | ICD-10-CM

## 2024-01-16 DIAGNOSIS — M6281 Muscle weakness (generalized): Secondary | ICD-10-CM

## 2024-01-16 DIAGNOSIS — R42 Dizziness and giddiness: Secondary | ICD-10-CM

## 2024-01-16 DIAGNOSIS — R278 Other lack of coordination: Secondary | ICD-10-CM

## 2024-01-16 NOTE — Therapy (Signed)
 OUTPATIENT PHYSICAL THERAPY NEURO TREATMENT   Patient Name: Wayne Lee MRN: [id number] DOB:[birth year], 48 y.o., male Today's Date: 01/16/2024   PCP: No PCP REFERRING PROVIDER: Sharlet Schmitz, PA-C  END OF SESSION:  PT End of Session - 01/16/24 1035     Visit Number 3    Number of Visits 16    Date for PT Re-Evaluation 03/27/24    Authorization Type [employer]    PT Start Time 1020    PT Stop Time 1058    PT Time Calculation (min) 38 min    Activity Tolerance Patient tolerated treatment well    Behavior During Therapy WFL for tasks assessed/performed               Past Medical History:  Diagnosis Date   Atopic dermatitis in adult    Left cataract    Obesity (BMI 30-39.9)    Stroke [facility])    Undescended left testicle    had surgical intervention   Past Surgical History:  Procedure Laterality Date   CATARACT EXTRACTION W/ INTRAOCULAR LENS IMPLANT Left 2017   CHOLECYSTECTOMY     ORCHIOPEXY  1984   SUBOCCIPITAL CRANIECTOMY CERVICAL LAMINECTOMY N/A 12/16/2023   Procedure: SUBOCCIPITAL CRANIECTOMY;  Surgeon: Colon Shove, MD;  Location: [facility] OR;  Service: Neurosurgery;  Laterality: N/A;   Patient Active Problem List   Diagnosis Date Noted   Nausea and vomiting 01/13/2024   Central nervous system origin vertigo, unspecified laterality 01/13/2024   Impaired mobility and ADLs 01/13/2024   Bilateral headaches 12/31/2023   ICH (intracerebral hemorrhage) (HCC) 12/20/2023   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   Hypertension 12/19/2023   Brain compression (HCC) 12/19/2023   Nontraumatic acute cerebral hemorrhage (HCC) 12/16/2023   Cerebellar hemorrhage (HCC) 12/16/2023    ONSET DATE: 12/15/22  REFERRING DIAG:  Diagnosis  I61.9 (ICD-10-CM) - Nontraumatic acute cerebral hemorrhage (HCC)    THERAPY DIAG:  Other abnormalities of gait and mobility  Muscle weakness (generalized)  Other lack of coordination  Difficulty in walking, not  elsewhere classified  Dizziness and giddiness  Rationale for Evaluation and Treatment: Rehabilitation  SUBJECTIVE:                                                                                                                                                                                             SUBJECTIVE STATEMENT:  Nothing new since last time, feeling pretty good. No falls since last time. We changed my meds Tuesday afternoon and its helping the nausea for sure.   Pt accompanied by: self  PERTINENT HISTORY: 1/6 stroke, ICU and IRF leaving hospital 1/21  PAIN:  Are you having pain? Yes: NPRS scale: 5/10 Pain location: occiput Pain description: aching, pulsing Aggravating factors: positional changes Relieving factors: medicine, stretching neck and shoulders, ice/heat  PRECAUTIONS: None  RED FLAGS: None   WEIGHT BEARING RESTRICTIONS: No  FALLS: Has patient fallen in last 6 months? No  LIVING ENVIRONMENT: Lives with: lives with their family and lives with their spouse Lives in: House/apartment Stairs: Yes: Internal: 15 steps; can reach both and External: 2 steps; none front door 4 steps rails on both sides Has following equipment at home: None  PLOF:  supervision for I/ADLs  PATIENT GOALS: walking, energy, endurance  OBJECTIVE:  Note: Objective measures were completed at Evaluation unless otherwise noted.  DIAGNOSTIC FINDINGS:  Nothing new  COGNITION: Overall cognitive status: Within functional limits for tasks assessed   SENSATION: WFL  COORDINATION: Toe taps and heel-to-shin normal  EDEMA:  None  MUSCLE TONE: RLE: Within functional limits  MUSCLE LENGTH: NT  POSTURE:  very wide BOS   LOWER EXTREMITY ROM:   WFL   Active  Right Eval Left Eval  Hip flexion    Hip extension    Hip abduction    Hip adduction    Hip internal rotation    Hip external rotation    Knee flexion    Knee extension    Ankle dorsiflexion    Ankle  plantarflexion    Ankle inversion    Ankle eversion     (Blank rows = not tested)  LOWER EXTREMITY MMT:    MMT Right Eval Left Eval  Hip flexion 5/5 5/5  Hip extension    Hip abduction 5/5 5/5  Hip adduction 5/5 5/5  Hip internal rotation    Hip external rotation    Knee flexion 5/5 5/5  Knee extension 5/5 5/5  Ankle dorsiflexion    Ankle plantarflexion    Ankle inversion    Ankle eversion    (Blank rows = not tested)  BED MOBILITY:  Sit to supine Complete Independence and Modified independence Supine to sit Modified independence Rolling to Right Complete Independence Rolling to Left Complete Independence  TRANSFERS: Assistive device utilized: None  Sit to stand: Modified independence Stand to sit: Complete Independence Chair to chair: Modified independence Floor:  NT   STAIRS: Level of Assistance:  NT Stair Negotiation Technique:  with  Number of Stairs:   Height of Stairs:   Comments: NT  GAIT: Gait pattern: wide BOS, R lateral lean using wall to balance on the way back to room Distance walked: 50 ft Assistive device utilized: None Level of assistance: Min A with HHA from wife (pt did not want to, but was leaning) Comments: could potentially use AD  FUNCTIONAL TESTS:  5 times sit to stand: 9.26 seconds Berg Balance Scale: 48/56  PATIENT SURVEYS:  NT today  TREATMENT DATE:    01/16/24  Tandem stance blue foam pad 3x30 seconds B intermittent BUE support on bars, min guard  Standing on blue foam pad with forward taps to targets x10 B, Min guard-MinA  Standing on blue foam pad with cross midline forward toe taps x10 B, min guard-MinA  Lateral shifts on rocker board x2 minutes, eyes on target to manage motion sensitivity, min guard  SLS on blue foam pad with other foot on soft surface of BOSU 3x30 seconds   Became nauseous and  actively vomiting mid session, did well with warm wash clothes and cookie      01/14/24 Seated march 20 reps alt  LAQ x10 each side  VOR x1 horizontal and vertical seated VOR x2 horizontal and vertical seated Feet together, EC feet together  Romberg stance, tandem standing 30s  SLS 10s  Standing on airex feet together Standing on airex head turns  On airex playing catch   01/03/24    PATIENT EDUCATION: Education details: Diagnosis, Prognosis, POC, holding off on HEP Person educated: Patient and Spouse Education method: Explanation, Demonstration, Tactile cues, and Verbal cues Education comprehension: verbalized understanding, returned demonstration, verbal cues required, tactile cues required, and needs further education  HOME EXERCISE PROGRAM: TBD  GOALS: Goals reviewed with patient? No  SHORT TERM GOALS: Target date: 01/25/24  Initial HEP will be prescribed by 3rd visit Baseline: not provided  Goal status: INITIAL  2.  Headache pain will not increase above 7/10 during session.  Baseline: started at 7/10, pt verbalized it was worse at end of eval  Goal status: INITIAL  3.  Activity increase will not elicit vomiting. Baseline: vomited at end of eval after BERG testing. Goal status: INITIAL   LONG TERM GOALS: Target date: 03/09/24  Pt will be independent with advanced HEP, as prescribed throughout POC. Baseline: not prescribed yet Goal status: INITIAL  2.  Pt will decrease HA pain to </= 4/10 with activity.  Baseline: > 7/10 Goal status: INITIAL  3.  Pt will increase BERG balance test score to >/= 52/56. Baseline: 48/56 Goal status: INITIAL  4.  Pt will complete full session without vomiting or any other adverse reactions.  Baseline: vomited after eval Goal status: INITIAL    ASSESSMENT:  CLINICAL IMPRESSION:  Pt arrives today doing OK, MD changed some meds that have been helping nausea so far. Requested we work on location manager today, activity  tolerance was a little better but we still took plenty of rest breaks today out of caution. Very motivated to improve, we will continue to take it step by step and progress as appropriate.      OBJECTIVE IMPAIRMENTS: decreased activity tolerance, decreased balance, difficulty walking, decreased safety awareness, impaired perceived functional ability, increased muscle spasms, and pain.   ACTIVITY LIMITATIONS: standing, stairs, and locomotion level  PARTICIPATION LIMITATIONS: cleaning, laundry, driving, shopping, community activity, and occupation  PERSONAL FACTORS: 1 comorbidity: history of stroke  are also affecting patient's functional outcome.   REHAB POTENTIAL: Good  CLINICAL DECISION MAKING: Evolving/moderate complexity  EVALUATION COMPLEXITY: Moderate  PLAN:  PT FREQUENCY: 1-2x/week  PT DURATION: 8 weeks  PLANNED INTERVENTIONS: 97164- PT Re-evaluation, 97110-Therapeutic exercises, 97530- Therapeutic activity, 97112- Neuromuscular re-education, 97535- Self Care, 02859- Manual therapy, and 97116- Gait training  PLAN FOR NEXT SESSION: Prescribe HEP, slow progression of balance, NMR, rest breaks to address overstimulation, consider DGI or more advanced test when pt is appropriate. See how he felt, do we need to dial it back  next visit?   Josette Rough, PT, DPT 01/16/24 10:59 AM

## 2024-01-20 ENCOUNTER — Encounter: Payer: Self-pay | Admitting: Physical Therapy

## 2024-01-20 ENCOUNTER — Telehealth: Payer: Self-pay | Admitting: Physical Medicine and Rehabilitation

## 2024-01-20 ENCOUNTER — Ambulatory Visit: Payer: 59 | Admitting: Physical Therapy

## 2024-01-20 DIAGNOSIS — H814 Vertigo of central origin: Secondary | ICD-10-CM

## 2024-01-20 DIAGNOSIS — M6281 Muscle weakness (generalized): Secondary | ICD-10-CM

## 2024-01-20 DIAGNOSIS — R262 Difficulty in walking, not elsewhere classified: Secondary | ICD-10-CM

## 2024-01-20 DIAGNOSIS — R112 Nausea with vomiting, unspecified: Secondary | ICD-10-CM

## 2024-01-20 DIAGNOSIS — R2689 Other abnormalities of gait and mobility: Secondary | ICD-10-CM

## 2024-01-20 DIAGNOSIS — R278 Other lack of coordination: Secondary | ICD-10-CM

## 2024-01-20 DIAGNOSIS — R42 Dizziness and giddiness: Secondary | ICD-10-CM

## 2024-01-20 DIAGNOSIS — I619 Nontraumatic intracerebral hemorrhage, unspecified: Secondary | ICD-10-CM

## 2024-01-20 NOTE — Therapy (Signed)
 OUTPATIENT PHYSICAL THERAPY NEURO TREATMENT   Patient Name: Wayne Lee MRN: [id number] DOB:Sep 10, 1976, 48 y.o., male Today's Date: 01/20/2024   PCP: No PCP REFERRING PROVIDER: Jean Michaelis, PA-C  END OF SESSION:  PT End of Session - 01/20/24 0918     Visit Number 4    Number of Visits 16    Date for PT Re-Evaluation 03/27/24    Authorization Type [employer]    PT Start Time [phone]    PT Stop Time [phone]    PT Time Calculation (min) 39 min    Activity Tolerance Patient tolerated treatment well;Other (comment)   vomited during session   Behavior During Therapy WFL for tasks assessed/performed                Past Medical History:  Diagnosis Date   Atopic dermatitis in adult    Left cataract    Obesity (BMI 30-39.9)    Stroke [facility])    Undescended left testicle    had surgical intervention   Past Surgical History:  Procedure Laterality Date   CATARACT EXTRACTION W/ INTRAOCULAR LENS IMPLANT Left 2017   CHOLECYSTECTOMY     ORCHIOPEXY  1984   SUBOCCIPITAL CRANIECTOMY CERVICAL LAMINECTOMY N/A 12/16/2023   Procedure: SUBOCCIPITAL CRANIECTOMY;  Surgeon: Elna Haggis, MD;  Location: [facility] OR;  Service: Neurosurgery;  Laterality: N/A;   Patient Active Problem List   Diagnosis Date Noted   Nausea and vomiting 01/13/2024   Central nervous system origin vertigo, unspecified laterality 01/13/2024   Impaired mobility and ADLs 01/13/2024   Bilateral headaches 12/31/2023   ICH (intracerebral hemorrhage) (HCC) 12/20/2023   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   Hypertension 12/19/2023   Brain compression (HCC) 12/19/2023   Nontraumatic acute cerebral hemorrhage (HCC) 12/16/2023   Cerebellar hemorrhage (HCC) 12/16/2023    ONSET DATE: 12/15/22  REFERRING DIAG:  Diagnosis  I61.9 (ICD-10-CM) - Nontraumatic acute cerebral hemorrhage (HCC)    THERAPY DIAG:  Other abnormalities of gait and mobility  Muscle weakness (generalized)  Other lack of  coordination  Difficulty in walking, not elsewhere classified  Dizziness and giddiness  Rationale for Evaluation and Treatment: Rehabilitation  SUBJECTIVE:                                                                                                                                                                                             SUBJECTIVE STATEMENT:  Feeling pretty good, feeling better. Think it was the movement that got me last PT session. Threw up last night but think it was just from eating a little too much.   Pt accompanied by: self  PERTINENT  HISTORY: 1/6 stroke, ICU and IRF leaving hospital 1/21  PAIN:  Are you having pain? Yes: NPRS scale: 6/10 Pain location: occiput Pain description: aching, pulsing Aggravating factors: positional changes Relieving factors: medicine, stretching neck and shoulders, ice/heat  PRECAUTIONS: None  RED FLAGS: None   WEIGHT BEARING RESTRICTIONS: No  FALLS: Has patient fallen in last 6 months? No  LIVING ENVIRONMENT: Lives with: lives with their family and lives with their spouse Lives in: House/apartment Stairs: Yes: Internal: 15 steps; can reach both and External: 2 steps; none front door 4 steps rails on both sides Has following equipment at home: None  PLOF:  supervision for I/ADLs  PATIENT GOALS: walking, energy, endurance  OBJECTIVE:  Note: Objective measures were completed at Evaluation unless otherwise noted.  DIAGNOSTIC FINDINGS:  Nothing new  COGNITION: Overall cognitive status: Within functional limits for tasks assessed   SENSATION: WFL  COORDINATION: Toe taps and heel-to-shin normal  EDEMA:  None  MUSCLE TONE: RLE: Within functional limits  MUSCLE LENGTH: NT  POSTURE:  very wide BOS   LOWER EXTREMITY ROM:   WFL   Active  Right Eval Left Eval  Hip flexion    Hip extension    Hip abduction    Hip adduction    Hip internal rotation    Hip external rotation    Knee flexion     Knee extension    Ankle dorsiflexion    Ankle plantarflexion    Ankle inversion    Ankle eversion     (Blank rows = not tested)  LOWER EXTREMITY MMT:    MMT Right Eval Left Eval  Hip flexion 5/5 5/5  Hip extension    Hip abduction 5/5 5/5  Hip adduction 5/5 5/5  Hip internal rotation    Hip external rotation    Knee flexion 5/5 5/5  Knee extension 5/5 5/5  Ankle dorsiflexion    Ankle plantarflexion    Ankle inversion    Ankle eversion    (Blank rows = not tested)  BED MOBILITY:  Sit to supine Complete Independence and Modified independence Supine to sit Modified independence Rolling to Right Complete Independence Rolling to Left Complete Independence  TRANSFERS: Assistive device utilized: None  Sit to stand: Modified independence Stand to sit: Complete Independence Chair to chair: Modified independence Floor:  NT   STAIRS: Level of Assistance:  NT Stair Negotiation Technique:  with  Number of Stairs:   Height of Stairs:   Comments: NT  GAIT: Gait pattern: wide BOS, R lateral lean using wall to balance on the way back to room Distance walked: 50 ft Assistive device utilized: None Level of assistance: Min A with HHA from wife (pt did not want to, but was leaning) Comments: could potentially use AD  FUNCTIONAL TESTS:  5 times sit to stand: 9.26 seconds Berg Balance Scale: 48/56  PATIENT SURVEYS:  NT today  TREATMENT DATE:    01/20/24    Tandem gait on blue foam pad x3 laps  Vor horizontal 2x60 seconds  Side steps on foam x3 laps  Saccades horizontal and vertical STS with horizontal VOR x3  Vomited after side stepping on foam- provided warm washcloth and mentos   Seated UT stretch 2x30 seconds B Levator stretch 2x30 seconds B  Chin tucks x10 Mod cues     01/16/24  Tandem stance blue foam pad 3x30 seconds B  intermittent BUE support on bars, min guard  Standing on blue foam pad with forward taps to targets x10 B, Min guard-MinA  Standing on blue foam pad with cross midline forward toe taps x10 B, min guard-MinA  Lateral shifts on rocker board x2 minutes, eyes on target to manage motion sensitivity, min guard  SLS on blue foam pad with other foot on soft surface of BOSU 3x30 seconds   Became nauseous and actively vomiting mid session, did well with warm wash clothes and cookie      01/14/24 Seated march 20 reps alt  LAQ x10 each side  VOR x1 horizontal and vertical seated VOR x2 horizontal and vertical seated Feet together, EC feet together  Romberg stance, tandem standing 30s  SLS 10s  Standing on airex feet together Standing on airex head turns  On airex playing catch   01/03/24    PATIENT EDUCATION: Education details: Diagnosis, Prognosis, POC, holding off on HEP Person educated: Patient and Spouse Education method: Explanation, Demonstration, Tactile cues, and Verbal cues Education comprehension: verbalized understanding, returned demonstration, verbal cues required, tactile cues required, and needs further education  HOME EXERCISE PROGRAM:  Access Code: [email or web address] + up and walking 5-10 minutes every hour awake  URL: [email or web address] Date: 01/20/2024 Prepared by: Terrel Ferries  Exercises - Tandem Stance in Corner  - 1-2 x daily - 7 x weekly - 1 sets - 3 reps - 30 seconds  hold - Seated Gaze Stabilization with Head Rotation  - 1-2 x daily - 7 x weekly - 1 sets - 2 reps - 60 seconds  hold   GOALS: Goals reviewed with patient? No  SHORT TERM GOALS: Target date: 01/25/24  Initial HEP will be prescribed by 3rd visit Baseline: not provided  Goal status: INITIAL  2.  Headache pain will not increase above 7/10 during session.  Baseline: started at 7/10, pt verbalized it was worse at end of eval  Goal status: INITIAL  3.  Activity increase will not elicit  vomiting. Baseline: vomited at end of eval after BERG testing. Goal status: INITIAL   LONG TERM GOALS: Target date: 03/09/24  Pt will be independent with advanced HEP, as prescribed throughout POC. Baseline: not prescribed yet Goal status: INITIAL  2.  Pt will decrease HA pain to </= 4/10 with activity.  Baseline: > 7/10 Goal status: INITIAL  3.  Pt will increase BERG balance test score to >/= 52/56. Baseline: 48/56 Goal status: INITIAL  4.  Pt will complete full session without vomiting or any other adverse reactions.  Baseline: vomited after eval Goal status: INITIAL    ASSESSMENT:  CLINICAL IMPRESSION:    Pt arrives today doing OK, we slowed the pace of the session a bit today given having triggered vomiting last visit. Also assigned basic HEP- took it slow with this too, recommended up and walking 5-10 minutes every hour since he tells me he is in the bed quite a bit during the day. Will continue to progress as able,  unfortunately session was again limited by active vomiting.     OBJECTIVE IMPAIRMENTS: decreased activity tolerance, decreased balance, difficulty walking, decreased safety awareness, impaired perceived functional ability, increased muscle spasms, and pain.   ACTIVITY LIMITATIONS: standing, stairs, and locomotion level  PARTICIPATION LIMITATIONS: cleaning, laundry, driving, shopping, community activity, and occupation  PERSONAL FACTORS: 1 comorbidity: history of stroke  are also affecting patient's functional outcome.   REHAB POTENTIAL: Good  CLINICAL DECISION MAKING: Evolving/moderate complexity  EVALUATION COMPLEXITY: Moderate  PLAN:  PT FREQUENCY: 1-2x/week  PT DURATION: 8 weeks  PLANNED INTERVENTIONS: 97164- PT Re-evaluation, 97110-Therapeutic exercises, 97530- Therapeutic activity, 97112- Neuromuscular re-education, 97535- Self Care, 14782- Manual therapy, and 97116- Gait training  PLAN FOR NEXT SESSION: Prescribe HEP, slow progression of  balance, NMR, rest breaks to address overstimulation, consider DGI or more advanced test when pt is appropriate. How does HEP + walking more in the house feel? Check STGs next visit   Terrel Ferries, PT, DPT 01/20/24 9:59 AM

## 2024-01-20 NOTE — Telephone Encounter (Signed)
 Patients wife called in and states patient has started with nausea and vomiting again , patient was good for 2 days and then symptoms started again.  Today during PT he did vomit as well and notices that it when he is more active . She would like to know what is suggested if other meds should be tried or what could help

## 2024-01-21 ENCOUNTER — Ambulatory Visit: Payer: 59

## 2024-01-21 DIAGNOSIS — R262 Difficulty in walking, not elsewhere classified: Secondary | ICD-10-CM

## 2024-01-21 DIAGNOSIS — R278 Other lack of coordination: Secondary | ICD-10-CM

## 2024-01-21 DIAGNOSIS — M6281 Muscle weakness (generalized): Secondary | ICD-10-CM

## 2024-01-21 DIAGNOSIS — R42 Dizziness and giddiness: Secondary | ICD-10-CM

## 2024-01-21 DIAGNOSIS — R2689 Other abnormalities of gait and mobility: Secondary | ICD-10-CM

## 2024-01-21 NOTE — Therapy (Signed)
OUTPATIENT PHYSICAL THERAPY NEURO TREATMENT   Patient Name: Wayne Lee MRN: [id number] DOB:1976-04-19, 48 y.o., male Today's Date: 01/21/2024   PCP: No PCP REFERRING PROVIDER: Delle Reining, PA-C  END OF SESSION:  PT End of Session - 01/21/24 1020     Visit Number 5    Number of Visits 16    Date for PT Re-Evaluation 03/27/24    Authorization Type [employer]    PT Start Time 1020    PT Stop Time 1100    PT Time Calculation (min) 40 min    Activity Tolerance Patient tolerated treatment well;Other (comment)   vomited during session   Behavior During Therapy WFL for tasks assessed/performed                 Past Medical History:  Diagnosis Date   Atopic dermatitis in adult    Left cataract    Obesity (BMI 30-39.9)    Stroke [facility])    Undescended left testicle    had surgical intervention   Past Surgical History:  Procedure Laterality Date   CATARACT EXTRACTION W/ INTRAOCULAR LENS IMPLANT Left 2017   CHOLECYSTECTOMY     ORCHIOPEXY  1984   SUBOCCIPITAL CRANIECTOMY CERVICAL LAMINECTOMY N/A 12/16/2023   Procedure: SUBOCCIPITAL CRANIECTOMY;  Surgeon: Barnett Abu, MD;  Location: [facility] OR;  Service: Neurosurgery;  Laterality: N/A;   Patient Active Problem List   Diagnosis Date Noted   Nausea and vomiting 01/13/2024   Central nervous system origin vertigo, unspecified laterality 01/13/2024   Impaired mobility and ADLs 01/13/2024   Bilateral headaches 12/31/2023   ICH (intracerebral hemorrhage) (HCC) 12/20/2023   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   Hypertension 12/19/2023   Brain compression (HCC) 12/19/2023   Nontraumatic acute cerebral hemorrhage (HCC) 12/16/2023   Cerebellar hemorrhage (HCC) 12/16/2023    ONSET DATE: 12/15/22  REFERRING DIAG:  Diagnosis  I61.9 (ICD-10-CM) - Nontraumatic acute cerebral hemorrhage (HCC)    THERAPY DIAG:  Other abnormalities of gait and mobility  Muscle weakness (generalized)  Other lack of  coordination  Difficulty in walking, not elsewhere classified  Dizziness and giddiness  Rationale for Evaluation and Treatment: Rehabilitation  SUBJECTIVE:                                                                                                                                                                                             SUBJECTIVE STATEMENT:  Feeling pretty good, feeling better. Think it was the movement that got me last PT session. Threw up last night but think it was just from eating a little too much.   Pt accompanied by: self  PERTINENT HISTORY: 1/6 stroke, ICU and IRF leaving hospital 1/21  PAIN:  Are you having pain? Yes: NPRS scale: 6/10 Pain location: occiput Pain description: aching, pulsing Aggravating factors: positional changes Relieving factors: medicine, stretching neck and shoulders, ice/heat  PRECAUTIONS: None  RED FLAGS: None   WEIGHT BEARING RESTRICTIONS: No  FALLS: Has patient fallen in last 6 months? No  LIVING ENVIRONMENT: Lives with: lives with their family and lives with their spouse Lives in: House/apartment Stairs: Yes: Internal: 15 steps; can reach both and External: 2 steps; none front door 4 steps rails on both sides Has following equipment at home: None  PLOF:  supervision for I/ADLs  PATIENT GOALS: walking, energy, endurance  OBJECTIVE:  Note: Objective measures were completed at Evaluation unless otherwise noted.  DIAGNOSTIC FINDINGS:  Nothing new  COGNITION: Overall cognitive status: Within functional limits for tasks assessed   SENSATION: WFL  COORDINATION: Toe taps and heel-to-shin normal  EDEMA:  None  MUSCLE TONE: RLE: Within functional limits  MUSCLE LENGTH: NT  POSTURE:  very wide BOS   LOWER EXTREMITY ROM:   WFL   Active  Right Eval Left Eval  Hip flexion    Hip extension    Hip abduction    Hip adduction    Hip internal rotation    Hip external rotation    Knee flexion     Knee extension    Ankle dorsiflexion    Ankle plantarflexion    Ankle inversion    Ankle eversion     (Blank rows = not tested)  LOWER EXTREMITY MMT:    MMT Right Eval Left Eval  Hip flexion 5/5 5/5  Hip extension    Hip abduction 5/5 5/5  Hip adduction 5/5 5/5  Hip internal rotation    Hip external rotation    Knee flexion 5/5 5/5  Knee extension 5/5 5/5  Ankle dorsiflexion    Ankle plantarflexion    Ankle inversion    Ankle eversion    (Blank rows = not tested)  BED MOBILITY:  Sit to supine Complete Independence and Modified independence Supine to sit Modified independence Rolling to Right Complete Independence Rolling to Left Complete Independence  TRANSFERS: Assistive device utilized: None  Sit to stand: Modified independence Stand to sit: Complete Independence Chair to chair: Modified independence Floor:  NT   STAIRS: Level of Assistance:  NT Stair Negotiation Technique:  with  Number of Stairs:   Height of Stairs:   Comments: NT  GAIT: Gait pattern: wide BOS, R lateral lean using wall to balance on the way back to room Distance walked: 50 ft Assistive device utilized: None Level of assistance: Min A with HHA from wife (pt did not want to, but was leaning) Comments: could potentially use AD  FUNCTIONAL TESTS:  5 times sit to stand: 9.26 seconds Berg Balance Scale: 48/56  PATIENT SURVEYS:  NT today  TREATMENT DATE:  01/21/24 NuStep L1 x71mins  Seated VOR x1 horizontal and vertical x15  Standing head turns horizontal and vertical x15-- dizzy and a little nauseous when looking up  Walking with head turns 5 steps down and back x2  Standing marches  Standing on airex reaching for numbers  Standing on airex ball toss then volleyball hits   01/20/24 Tandem gait on blue foam pad x3 laps  Vor horizontal 2x60 seconds  Side  steps on foam x3 laps  Saccades horizontal and vertical STS with horizontal VOR x3  Vomited after side stepping on foam- provided warm washcloth and mentos   Seated UT stretch 2x30 seconds B Levator stretch 2x30 seconds B  Chin tucks x10 Mod cues     01/16/24  Tandem stance blue foam pad 3x30 seconds B intermittent BUE support on bars, min guard  Standing on blue foam pad with forward taps to targets x10 B, Min guard-MinA  Standing on blue foam pad with cross midline forward toe taps x10 B, min guard-MinA  Lateral shifts on rocker board x2 minutes, eyes on target to manage motion sensitivity, min guard  SLS on blue foam pad with other foot on soft surface of BOSU 3x30 seconds   Became nauseous and actively vomiting mid session, did well with warm wash clothes and cookie      01/14/24 Seated march 20 reps alt  LAQ x10 each side  VOR x1 horizontal and vertical seated VOR x2 horizontal and vertical seated Feet together, EC feet together  Romberg stance, tandem standing 30s  SLS 10s  Standing on airex feet together Standing on airex head turns  On airex playing catch   01/03/24    PATIENT EDUCATION: Education details: Diagnosis, Prognosis, POC, holding off on HEP Person educated: Patient and Spouse Education method: Explanation, Demonstration, Tactile cues, and Verbal cues Education comprehension: verbalized understanding, returned demonstration, verbal cues required, tactile cues required, and needs further education  HOME EXERCISE PROGRAM:  Access Code: [email or web address] + up and walking 5-10 minutes every hour awake  URL: [email or web address] Date: 01/20/2024 Prepared by: Nedra Hai  Exercises - Tandem Stance in Corner  - 1-2 x daily - 7 x weekly - 1 sets - 3 reps - 30 seconds  hold - Seated Gaze Stabilization with Head Rotation  - 1-2 x daily - 7 x weekly - 1 sets - 2 reps - 60 seconds  hold   GOALS: Goals reviewed with patient? No  SHORT TERM GOALS:  Target date: 01/25/24  Initial HEP will be prescribed by 3rd visit Baseline: not provided  Goal status: MET 01/20/24  2.  Headache pain will not increase above 7/10 during session.  Baseline: started at 7/10, pt verbalized it was worse at end of eval  Goal status: ongoing 01/21/24  3.  Activity increase will not elicit vomiting. Baseline: vomited at end of eval after BERG testing. Goal status: ongoing 01/21/24   LONG TERM GOALS: Target date: 03/09/24  Pt will be independent with advanced HEP, as prescribed throughout POC. Baseline: not prescribed yet Goal status: INITIAL  2.  Pt will decrease HA pain to </= 4/10 with activity.  Baseline: > 7/10 Goal status: INITIAL  3.  Pt will increase BERG balance test score to >/= 52/56. Baseline: 48/56 Goal status: INITIAL  4.  Pt will complete full session without vomiting or any other adverse reactions.  Baseline: vomited after eval Goal status: progressing 2 sessions back to back w/vomit, 2 sessions without  ASSESSMENT:  CLINICAL IMPRESSION: Pt arrives today doing OK, we slowed the pace of the session a bit today given having triggered vomiting last visit. He does better with static balance activities. Reports some dizziness, pain, and nausea with tasks that require looking up. Some instability with standing marches but not enough to throw off his balance. Does well with volleyball hits standing on airex. Will continue to progress as able, was able to get through session today without vomiting.     OBJECTIVE IMPAIRMENTS: decreased activity tolerance, decreased balance, difficulty walking, decreased safety awareness, impaired perceived functional ability, increased muscle spasms, and pain.   ACTIVITY LIMITATIONS: standing, stairs, and locomotion level  PARTICIPATION LIMITATIONS: cleaning, laundry, driving, shopping, community activity, and occupation  PERSONAL FACTORS: 1 comorbidity: history of stroke  are also affecting patient's  functional outcome.   REHAB POTENTIAL: Good  CLINICAL DECISION MAKING: Evolving/moderate complexity  EVALUATION COMPLEXITY: Moderate  PLAN:  PT FREQUENCY: 1-2x/week  PT DURATION: 8 weeks  PLANNED INTERVENTIONS: 97164- PT Re-evaluation, 97110-Therapeutic exercises, 97530- Therapeutic activity, 97112- Neuromuscular re-education, 97535- Self Care, [id number]- Manual therapy, and 97116- Gait training  PLAN FOR NEXT SESSION: Prescribe HEP, slow progression of balance, NMR, rest breaks to address overstimulation, consider DGI or more advanced test when pt is appropriate. How does HEP + walking more in the house feel? Check STGs next visit   Nedra Hai, PT, DPT 01/21/24 11:05 AM

## 2024-01-22 ENCOUNTER — Ambulatory Visit: Payer: 59 | Admitting: Occupational Therapy

## 2024-01-22 DIAGNOSIS — R2689 Other abnormalities of gait and mobility: Secondary | ICD-10-CM | POA: Diagnosis not present

## 2024-01-22 DIAGNOSIS — M6281 Muscle weakness (generalized): Secondary | ICD-10-CM

## 2024-01-22 DIAGNOSIS — R278 Other lack of coordination: Secondary | ICD-10-CM

## 2024-01-22 DIAGNOSIS — R41842 Visuospatial deficit: Secondary | ICD-10-CM

## 2024-01-22 MED ORDER — DIAZEPAM 2 MG PO TABS: 2.0000 mg | ORAL_TABLET | Freq: Two times a day (BID) | ORAL | 0 refills | Status: DC | PRN

## 2024-01-22 NOTE — Therapy (Signed)
 OUTPATIENT OCCUPATIONAL THERAPY NEURO Treatment  Patient Name: Wayne Lee MRN: [id number] DOB:03-May-1976, 48 y.o., male Today's Date: 01/22/2024  PCP: none REFERRING PROVIDER: Dr. Shearon Stalls  END OF SESSION:  OT End of Session - 01/22/24 1634     Visit Number 3    Number of Visits 25    Date for OT Re-Evaluation 03/31/24    Authorization Type aetna    Authorization Time Period 12 weeks    OT Start Time 1535    OT Stop Time 1615    OT Time Calculation (min) 40 min    Activity Tolerance Patient tolerated treatment well    Behavior During Therapy WFL for tasks assessed/performed   pt vomited end of session              Past Medical History:  Diagnosis Date   Atopic dermatitis in adult    Left cataract    Obesity (BMI 30-39.9)    Stroke [facility])    Undescended left testicle    had surgical intervention   Past Surgical History:  Procedure Laterality Date   CATARACT EXTRACTION W/ INTRAOCULAR LENS IMPLANT Left 2017   CHOLECYSTECTOMY     ORCHIOPEXY  1984   SUBOCCIPITAL CRANIECTOMY CERVICAL LAMINECTOMY N/A 12/16/2023   Procedure: SUBOCCIPITAL CRANIECTOMY;  Surgeon: Barnett Abu, MD;  Location: [facility] OR;  Service: Neurosurgery;  Laterality: N/A;   Patient Active Problem List   Diagnosis Date Noted   Nausea and vomiting 01/13/2024   Central nervous system origin vertigo, unspecified laterality 01/13/2024   Impaired mobility and ADLs 01/13/2024   Bilateral headaches 12/31/2023   ICH (intracerebral hemorrhage) (HCC) 12/20/2023   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   Hypertension 12/19/2023   Brain compression (HCC) 12/19/2023   Nontraumatic acute cerebral hemorrhage (HCC) 12/16/2023   Cerebellar hemorrhage (HCC) 12/16/2023    ONSET DATE: 12/16/23  REFERRING DIAG: I61.9 (ICD-10-CM) - Nontraumatic acute cerebral hemorrhage (HCC)   THERAPY DIAG:  Muscle weakness (generalized)  Other lack of coordination  Visuospatial deficit  Rationale for  Evaluation and Treatment: Rehabilitation  SUBJECTIVE:   SUBJECTIVE STATEMENT: Pt reports he is doing better Pt accompanied by: significant other  PERTINENT HISTORY: 48 yo male arrival 1/6 with HTN 204/106 taken to emergent OR hematoma evacuation s/p suboccipital craniotomy and hypertonic saline started. Extubated 1/7 PMH obesity, sleep apnea, HLD d/c home 1/21/5 Per pt's wife AVM  CT Head without contrast: Large acute 5.3 cm intraparenchymal hemorrhage in the left cerebellum. Mass effect with narrowed fourth ventricle, but no hydrocephalus at this time. Basal cisterns are effaced and there is early ascending transtentorial herniation. PRECAUTIONS: Fall  WEIGHT BEARING RESTRICTIONS: No  PAIN:  Are you having pain? Yes: NPRS scale: 6/10 Pain location: headache Pain description: aching Aggravating factors: light sensitive Relieving factors: less light  FALLS: Has patient fallen in last 6 months? No  LIVING ENVIRONMENT: Lives with: lives with their family and lives with their spouse Lives in: House/apartment Stairs: yes  Has following equipment at home: None  PLOF: Independent  PATIENT GOALS: return to prior level  OBJECTIVE:  Note: Objective measures were completed at Evaluation unless otherwise noted.  HAND DOMINANCE: Right  ADLs: Overall ADLs: mod I-supervision with basic ADLS Transfers/ambulation related to ADLs: Eating: mod I Grooming: mod I UB Dressing: mod I LB Dressing: mod I Toileting: mod I Bathing: supervision, sits in bottom of bathtub Tub Shower transfers: supervision   IADLs:dependent for IADLS Medication management: wife is helping  Financial management: dependent   MOBILITY STATUS:  mod  I-supervision    ACTIVITY TOLERANCE: Activity tolerance: limited by nausea   UPPER EXTREMITY ROM:  WFLS   UPPER EXTREMITY MMT:     MMT Right eval Left eval  Shoulder flexion 4+/5 4/5  Shoulder abduction    Shoulder adduction    Shoulder  extension    Shoulder internal rotation    Shoulder external rotation    Middle trapezius    Lower trapezius    Elbow flexion 4+/5 4+/5  Elbow extension 4+/5 4/5  Wrist flexion    Wrist extension    Wrist ulnar deviation    Wrist radial deviation    Wrist pronation    Wrist supination    (Blank rows = not tested)  HAND FUNCTION: Grip strength: Right: 63 lbs; Left: 48 lbs  COORDINATION: 9 Hole Peg test: Right: 26.25 sec; Left: 32.80 with several drops sec  SENSATION: WFL  COGNITION: Overall cognitive status:  recalls 3/3 words following short delay, spells WORLD backwards without difficulty  VISION: Subjective report: Pt reports double vision at times   VISION ASSESSMENT: To be further assessed in functional context Pt reports diplopia at times He was able to track to all 4 quadrants and visual fiels were intact. He became nauseous and vomited shortly after vision testing. Pt appears to have some vestibular issues.  Patient has difficulty with following activities due to following visual impairments: reading    OBSERVATIONS: Pleasant agreeable male, accompanied by his wife and 1 y.o son Pt's BP was elevated at end of session when he was vomiting 140/102, and 148/115, pt was instructed to monitor at home and to contact MD if diastolic BP does not reduce to below 100. pt's wife verbalized understanding                                                                                                                             TREATMENT DATE:01/22/24- see pt. education Copying small peg design with LUE for fine motor coordination with a visual componant, min difficulty/ drops for coordination. Pt copied design correctly without v.c Number cancellation 31M with 100% accuracy and no reports of dizziness nausea    01/15/24-coordiantion HEP issued, see education  01/07/24 eval        PATIENT EDUCATION: Education details: recommendation for attending dtr's gymnastics  meet to avoid becoming overwhelmed or dizzy, return to work recommendations and accommodations- pt would like to return to work 02/24/24, Therapist recommends remote work initally and starting at no more than 4 hrs per day, Green putty HEP was issued for sustained grip and pinch. Person educated: Patient and Spouse Education method: Explanation, demonstration, v.c  Education comprehension: verbalized understanding, returned demosntration,   HOME EXERCISE PROGRAM: coordination, putty   GOALS: Goals reviewed with patient? Yes  SHORT TERM GOALS: Target date: 02/06/24  I with HEP  Goal status: met, 01/22/24- demonstrates understanding of putty exercises  2.  Pt will demonstrate improved fine motor coordination as evidenced by decreasing 9  hole peg test LUE to 29 secs or less without several drops.  Goal status: ongoing  3.  Pt will increase LUE grip strength to 52 lbs or greater for increased ease with daily activities.  Goal status: ongoing  4.  Pt will perfrom tabletop scanning wihout diplopia or vomiting with 90% or better accuracy.  Goal status: INITIAL  5.  Pt will perfrom transitional movments for ADLS/ IADLs without LOB or nausea greater than 3/10.  Goal status: INITIAL  6. I with adpaed strategies for ADLs/IADLs to improve safety and I.   Goal status: INITIAL  LONG TERM GOALS: Target date: 03/31/24  I with updated HEP  Goal status: INITIAL  2.  Pt will perform mod complex home management mod I  Goal status: INITIAL  3.  Pt will perform environmental scanning in a busy environment with 90% or better accuracy, without vomiting  Goal status: INITIAL  4.  Pt will perfrom simulated work activities mod I  Goal status: INITIAL    ASSESSMENT:  CLINICAL IMPRESSION: Pt is progressing towards goals. He was able to tolerate therapy without vomiting. He demonstrates understanding of green putty HEP . Pt to generate a list of work activities that he would like to be  able to perfrom initally. PERFORMANCE DEFICITS: in functional skills including ADLs, IADLs, coordination, dexterity, ROM, strength, Fine motor control, Gross motor control, balance, endurance, decreased knowledge of precautions, decreased knowledge of use of DME, vision, UE functional use, and vestibular, cognitive skills including safety awareness, and psychosocial skills including coping strategies, environmental adaptation, habits, interpersonal interactions, and routines and behaviors.   IMPAIRMENTS: are limiting patient from ADLs, IADLs, rest and sleep, work, play, leisure, and social participation.   CO-MORBIDITIES: may have co-morbidities  that affects occupational performance. Patient will benefit from skilled OT to address above impairments and improve overall function.  MODIFICATION OR ASSISTANCE TO COMPLETE EVALUATION: Min-Moderate modification of tasks or assist with assess necessary to complete an evaluation.  OT OCCUPATIONAL PROFILE AND HISTORY: Detailed assessment: Review of records and additional review of physical, cognitive, psychosocial history related to current functional performance.  CLINICAL DECISION MAKING: LOW - limited treatment options, no task modification necessary  REHAB POTENTIAL: Good  EVALUATION COMPLEXITY: Low    PLAN:  OT FREQUENCY: 2x/week plus eval  OT DURATION: 12 weeks  PLANNED INTERVENTIONS: 97168 OT Re-evaluation, 97535 self care/ADL training, 13086 therapeutic exercise, 97530 therapeutic activity, 97112 neuromuscular re-education, 97140 manual therapy, 97116 gait training, 57846 aquatic therapy, 97035 ultrasound, 97018 paraffin, 96295 moist heat, 97010 cryotherapy, 97034 contrast bath, balance training, functional mobility training, visual/perceptual remediation/compensation, energy conservation, coping strategies training, patient/family education, and DME and/or AE instructions  RECOMMENDED OTHER SERVICES: PT  CONSULTED AND AGREED WITH PLAN OF  CARE: Patient  PLAN FOR NEXT SESSION:  continue to discuss return to work and [employer], activities on Drain, OT 01/22/2024, 4:35 PM

## 2024-01-22 NOTE — Telephone Encounter (Signed)
Called back to patient's wife, stated symptoms had improved and he was able to get through most of therapy yesterday.  Has had a few events recently with his daughter school that required a lot of stimulation that he was able to get through without vomiting, then over few days had a sudden increase in episodes around physical therapy.  They did adjust his physical therapy earlier this week to reduce exacerbation of symptoms, which helped quite a bit.  He continues on scopolamine patch and meclizine 25 mg 3 times daily as needed, which has been beneficial.  Nausea and vomiting seems to be worst first thing in the morning when he gets up.  Daughter has a chair meet this weekend that patient is very motivated to go to, is in [place], but worried that sensory overstimulation will worsen symptoms.  Plan:  -Increase a.m. dose of meclizine to 50 mg, continue other to as needed doses daily at 25 mg. -Prescribed Valium 2 mg number 10 tablets for nausea/vomiting/vertigo not responsive to meclizine for when symptoms are severe -Discussed planning breaks from sensory overstimulation during chair tournament this weekend, including allowing the patient to step away to a quiet area between his daughters events. -Continue eating and drinking well, and working with physical therapy.  Since symptoms have spontaneously improved since last visit, do not feel urgent repeat imaging is necessitated at this time.   Angelina Sheriff, DO 01/22/2024

## 2024-01-22 NOTE — Addendum Note (Signed)
Addended by: Elijah Birk on: 01/22/2024 12:28 PM   Modules accepted: Orders

## 2024-01-27 ENCOUNTER — Ambulatory Visit: Payer: 59 | Admitting: Physical Therapy

## 2024-01-27 ENCOUNTER — Encounter: Payer: Self-pay | Admitting: Physical Therapy

## 2024-01-27 DIAGNOSIS — M6281 Muscle weakness (generalized): Secondary | ICD-10-CM

## 2024-01-27 DIAGNOSIS — R278 Other lack of coordination: Secondary | ICD-10-CM

## 2024-01-27 DIAGNOSIS — R2689 Other abnormalities of gait and mobility: Secondary | ICD-10-CM | POA: Diagnosis not present

## 2024-01-27 NOTE — Therapy (Signed)
 OUTPATIENT PHYSICAL THERAPY NEURO TREATMENT   Patient Name: Wayne Lee MRN: [id number] DOB:11-Apr-1976, 48 y.o., male Today's Date: 01/27/2024   PCP: No PCP REFERRING PROVIDER: Delle Reining, PA-C  END OF SESSION:  PT End of Session - 01/27/24 0856     Visit Number 6    Number of Visits 16    Date for PT Re-Evaluation 03/27/24    Authorization Type [employer] [facility] [employer]    PT Start Time [phone]    PT Stop Time 0926    PT Time Calculation (min) 39 min    Activity Tolerance Patient tolerated treatment well    Behavior During Therapy [facility] for tasks assessed/performed                  Past Medical History:  Diagnosis Date   Atopic dermatitis in adult    Left cataract    Obesity (BMI 30-39.9)    Stroke [facility])    Undescended left testicle    had surgical intervention   Past Surgical History:  Procedure Laterality Date   CATARACT EXTRACTION W/ INTRAOCULAR LENS IMPLANT Left 2017   CHOLECYSTECTOMY     ORCHIOPEXY  1984   SUBOCCIPITAL CRANIECTOMY CERVICAL LAMINECTOMY N/A 12/16/2023   Procedure: SUBOCCIPITAL CRANIECTOMY;  Surgeon: Barnett Abu, MD;  Location: [facility] OR;  Service: Neurosurgery;  Laterality: N/A;   Patient Active Problem List   Diagnosis Date Noted   Nausea and vomiting 01/13/2024   Central nervous system origin vertigo, unspecified laterality 01/13/2024   Impaired mobility and ADLs 01/13/2024   Bilateral headaches 12/31/2023   ICH (intracerebral hemorrhage) (HCC) 12/20/2023   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   Hypertension 12/19/2023   Brain compression (HCC) 12/19/2023   Nontraumatic acute cerebral hemorrhage (HCC) 12/16/2023   Cerebellar hemorrhage (HCC) 12/16/2023    ONSET DATE: 12/15/22  REFERRING DIAG:  Diagnosis  I61.9 (ICD-10-CM) - Nontraumatic acute cerebral hemorrhage (HCC)    THERAPY DIAG:  Muscle weakness (generalized)  Other lack of coordination  Rationale for Evaluation and Treatment: Rehabilitation  SUBJECTIVE:                                                                                                                                                                                              SUBJECTIVE STATEMENT:  Doing OK, threw up at the kid's gymnastics competition at the end of it, not sure what triggered it. Didn't throw up in last PT or OT sessions. No nausea/dizziness today.   Pt accompanied by: self  PERTINENT HISTORY: 1/6 stroke, ICU and IRF leaving hospital 1/21  PAIN:  Are you having pain? No 0/10  PRECAUTIONS: None  RED FLAGS: None   WEIGHT BEARING RESTRICTIONS: No  FALLS: Has patient fallen in last 6 months? No  LIVING ENVIRONMENT: Lives with: lives with their family and lives with their spouse Lives in: House/apartment Stairs: Yes: Internal: 15 steps; can reach both and External: 2 steps; none front door 4 steps rails on both sides Has following equipment at home: None  PLOF:  supervision for I/ADLs  PATIENT GOALS: walking, energy, endurance  OBJECTIVE:  Note: Objective measures were completed at Evaluation unless otherwise noted.  DIAGNOSTIC FINDINGS:  Nothing new  COGNITION: Overall cognitive status: Within functional limits for tasks assessed   SENSATION: WFL  COORDINATION: Toe taps and heel-to-shin normal  EDEMA:  None  MUSCLE TONE: RLE: Within functional limits  MUSCLE LENGTH: NT  POSTURE:  very wide BOS   LOWER EXTREMITY ROM:   WFL   Active  Right Eval Left Eval  Hip flexion    Hip extension    Hip abduction    Hip adduction    Hip internal rotation    Hip external rotation    Knee flexion    Knee extension    Ankle dorsiflexion    Ankle plantarflexion    Ankle inversion    Ankle eversion     (Blank rows = not tested)  LOWER EXTREMITY MMT:    MMT Right Eval Left Eval  Hip flexion 5/5 5/5  Hip extension    Hip abduction 5/5 5/5  Hip adduction 5/5 5/5  Hip internal rotation    Hip external rotation    Knee flexion  5/5 5/5  Knee extension 5/5 5/5  Ankle dorsiflexion    Ankle plantarflexion    Ankle inversion    Ankle eversion    (Blank rows = not tested)  BED MOBILITY:  Sit to supine Complete Independence and Modified independence Supine to sit Modified independence Rolling to Right Complete Independence Rolling to Left Complete Independence  TRANSFERS: Assistive device utilized: None  Sit to stand: Modified independence Stand to sit: Complete Independence Chair to chair: Modified independence Floor:  NT   STAIRS: Level of Assistance:  NT Stair Negotiation Technique:  with  Number of Stairs:   Height of Stairs:   Comments: NT  GAIT: Gait pattern: wide BOS, R lateral lean using wall to balance on the way back to room Distance walked: 50 ft Assistive device utilized: None Level of assistance: Min A with HHA from wife (pt did not want to, but was leaning) Comments: could potentially use AD  FUNCTIONAL TESTS:  5 times sit to stand: 9.26 seconds Berg Balance Scale: 48/56  PATIENT SURVEYS:  NT today                                                                                                                              TREATMENT DATE:   01/27/24  Standing VOR horizontal 2x60 seconds Standing VOR vertical 2x60 seconds- still more nauseous with cervical flexion/extension Standing marches  on blue foam pad cues to stay in middle of pad x20  Standing on blue foam pad + cross midline reaching to target progressive difficulty (cues to find target with eyes/head before reaching for it) Standing on blue foam pad + cross midline toe taps to targets (cues to find target with eyes/head before tapping) SLS with one foot on soft surface of BOSU/one foot on floor 3x30 seconds B  Alternating toe taps to BOSU from solid surface x20        01/21/24 NuStep L1 x41mins  Seated VOR x1 horizontal and vertical x15  Standing head turns horizontal and vertical x15-- dizzy and a little nauseous  when looking up  Walking with head turns 5 steps down and back x2  Standing marches  Standing on airex reaching for numbers  Standing on airex ball toss then volleyball hits   01/20/24 Tandem gait on blue foam pad x3 laps  Vor horizontal 2x60 seconds  Side steps on foam x3 laps  Saccades horizontal and vertical STS with horizontal VOR x3  Vomited after side stepping on foam- provided warm washcloth and mentos   Seated UT stretch 2x30 seconds B Levator stretch 2x30 seconds B  Chin tucks x10 Mod cues     01/16/24  Tandem stance blue foam pad 3x30 seconds B intermittent BUE support on bars, min guard  Standing on blue foam pad with forward taps to targets x10 B, Min guard-MinA  Standing on blue foam pad with cross midline forward toe taps x10 B, min guard-MinA  Lateral shifts on rocker board x2 minutes, eyes on target to manage motion sensitivity, min guard  SLS on blue foam pad with other foot on soft surface of BOSU 3x30 seconds   Became nauseous and actively vomiting mid session, did well with warm wash clothes and cookie      01/14/24 Seated march 20 reps alt  LAQ x10 each side  VOR x1 horizontal and vertical seated VOR x2 horizontal and vertical seated Feet together, EC feet together  Romberg stance, tandem standing 30s  SLS 10s  Standing on airex feet together Standing on airex head turns  On airex playing catch   01/03/24    PATIENT EDUCATION: Education details: Diagnosis, Prognosis, POC, holding off on HEP Person educated: Patient and Spouse Education method: Explanation, Demonstration, Tactile cues, and Verbal cues Education comprehension: verbalized understanding, returned demonstration, verbal cues required, tactile cues required, and needs further education  HOME EXERCISE PROGRAM:  Access Code: [email or web address] + up and walking 5-10 minutes every hour awake  URL: [email or web address] Date: 01/20/2024 Prepared by: Nedra Hai  Exercises -  Tandem Stance in Corner  - 1-2 x daily - 7 x weekly - 1 sets - 3 reps - 30 seconds  hold - Seated Gaze Stabilization with Head Rotation  - 1-2 x daily - 7 x weekly - 1 sets - 2 reps - 60 seconds  hold   GOALS: Goals reviewed with patient? No  SHORT TERM GOALS: Target date: 01/25/24  Initial HEP will be prescribed by 3rd visit Baseline: not provided  Goal status: MET 01/20/24  2.  Headache pain will not increase above 7/10 during session.  Baseline: started at 7/10, pt verbalized it was worse at end of eval  Goal status: ongoing 01/21/24  3.  Activity increase will not elicit vomiting. Baseline: vomited at end of eval after BERG testing. Goal status: ongoing 01/21/24   LONG TERM GOALS: Target date: 03/09/24  Pt will be independent with advanced  HEP, as prescribed throughout POC. Baseline: not prescribed yet Goal status: INITIAL  2.  Pt will decrease HA pain to </= 4/10 with activity.  Baseline: > 7/10 Goal status: INITIAL  3.  Pt will increase BERG balance test score to >/= 52/56. Baseline: 48/56 Goal status: INITIAL  4.  Pt will complete full session without vomiting or any other adverse reactions.  Baseline: vomited after eval Goal status: progressing 2 sessions back to back w/vomit, 2 sessions without     ASSESSMENT:  CLINICAL IMPRESSION:   Pt arrives today doing OK, did have some vomiting over the weekend unfortunately. Kept the pace of today's session slow and steady with extra breaks PRN to try to counter nausea/urge to vomit. Still has to move slowly with challenging tasks, but he was able to complete session without vomiting today! Will continue to progress as able.     OBJECTIVE IMPAIRMENTS: decreased activity tolerance, decreased balance, difficulty walking, decreased safety awareness, impaired perceived functional ability, increased muscle spasms, and pain.   ACTIVITY LIMITATIONS: standing, stairs, and locomotion level  PARTICIPATION LIMITATIONS: cleaning,  laundry, driving, shopping, community activity, and occupation  PERSONAL FACTORS: 1 comorbidity: history of stroke  are also affecting patient's functional outcome.   REHAB POTENTIAL: Good  CLINICAL DECISION MAKING: Evolving/moderate complexity  EVALUATION COMPLEXITY: Moderate  PLAN:  PT FREQUENCY: 1-2x/week  PT DURATION: 8 weeks  PLANNED INTERVENTIONS: 97164- PT Re-evaluation, 97110-Therapeutic exercises, 97530- Therapeutic activity, 97112- Neuromuscular re-education, 97535- Self Care, 16109- Manual therapy, and 97116- Gait training  PLAN FOR NEXT SESSION: Prescribe HEP, slow progression of balance, NMR, rest breaks to address overstimulation, consider DGI or more advanced test when pt is appropriate. How does HEP + walking more in the house feel? Caution with cervical flexion/extension, causes more nausea/sx    Nedra Hai, PT, DPT 01/27/24 9:27 AM

## 2024-01-28 ENCOUNTER — Ambulatory Visit: Payer: 59

## 2024-01-28 DIAGNOSIS — R2689 Other abnormalities of gait and mobility: Secondary | ICD-10-CM

## 2024-01-28 DIAGNOSIS — R262 Difficulty in walking, not elsewhere classified: Secondary | ICD-10-CM

## 2024-01-28 DIAGNOSIS — R278 Other lack of coordination: Secondary | ICD-10-CM

## 2024-01-28 DIAGNOSIS — R42 Dizziness and giddiness: Secondary | ICD-10-CM

## 2024-01-28 DIAGNOSIS — R41842 Visuospatial deficit: Secondary | ICD-10-CM

## 2024-01-28 DIAGNOSIS — M6281 Muscle weakness (generalized): Secondary | ICD-10-CM

## 2024-01-28 NOTE — Therapy (Signed)
 OUTPATIENT PHYSICAL THERAPY NEURO TREATMENT   Patient Name: Wayne Lee MRN: [id number] DOB:May 09, 1976, 48 y.o., male Today's Date: 01/28/2024   PCP: No PCP REFERRING PROVIDER: Delle Reining, PA-C  END OF SESSION:  PT End of Session - 01/28/24 1546     Visit Number 7    Number of Visits 16    Date for PT Re-Evaluation 03/27/24    Authorization Type [employer]    PT Start Time [phone]    PT Stop Time 1630    PT Time Calculation (min) 45 min    Activity Tolerance Patient tolerated treatment well    Behavior During Therapy WFL for tasks assessed/performed                   Past Medical History:  Diagnosis Date   Atopic dermatitis in adult    Left cataract    Obesity (BMI 30-39.9)    Stroke [facility])    Undescended left testicle    had surgical intervention   Past Surgical History:  Procedure Laterality Date   CATARACT EXTRACTION W/ INTRAOCULAR LENS IMPLANT Left 2017   CHOLECYSTECTOMY     ORCHIOPEXY  1984   SUBOCCIPITAL CRANIECTOMY CERVICAL LAMINECTOMY N/A 12/16/2023   Procedure: SUBOCCIPITAL CRANIECTOMY;  Surgeon: Barnett Abu, MD;  Location: [facility] OR;  Service: Neurosurgery;  Laterality: N/A;   Patient Active Problem List   Diagnosis Date Noted   Nausea and vomiting 01/13/2024   Central nervous system origin vertigo, unspecified laterality 01/13/2024   Impaired mobility and ADLs 01/13/2024   Bilateral headaches 12/31/2023   ICH (intracerebral hemorrhage) (HCC) 12/20/2023   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   Hypertension 12/19/2023   Brain compression (HCC) 12/19/2023   Nontraumatic acute cerebral hemorrhage (HCC) 12/16/2023   Cerebellar hemorrhage (HCC) 12/16/2023    ONSET DATE: 12/15/22  REFERRING DIAG:  Diagnosis  I61.9 (ICD-10-CM) - Nontraumatic acute cerebral hemorrhage (HCC)    THERAPY DIAG:  Muscle weakness (generalized)  Other lack of coordination  Visuospatial deficit  Other abnormalities of gait and  mobility  Dizziness and giddiness  Difficulty in walking, not elsewhere classified  Rationale for Evaluation and Treatment: Rehabilitation  SUBJECTIVE:                                                                                                                                                                                             SUBJECTIVE STATEMENT: Feeling good, just tired. Have a little headache  Pt accompanied by: self  PERTINENT HISTORY: 1/6 stroke, ICU and IRF leaving hospital 1/21  PAIN:  Are you having pain? No 0/10  PRECAUTIONS: None  RED FLAGS:  None   WEIGHT BEARING RESTRICTIONS: No  FALLS: Has patient fallen in last 6 months? No  LIVING ENVIRONMENT: Lives with: lives with their family and lives with their spouse Lives in: House/apartment Stairs: Yes: Internal: 15 steps; can reach both and External: 2 steps; none front door 4 steps rails on both sides Has following equipment at home: None  PLOF:  supervision for I/ADLs  PATIENT GOALS: walking, energy, endurance  OBJECTIVE:  Note: Objective measures were completed at Evaluation unless otherwise noted.  DIAGNOSTIC FINDINGS:  Nothing new  COGNITION: Overall cognitive status: Within functional limits for tasks assessed   SENSATION: WFL  COORDINATION: Toe taps and heel-to-shin normal  EDEMA:  None  MUSCLE TONE: RLE: Within functional limits  MUSCLE LENGTH: NT  POSTURE:  very wide BOS   LOWER EXTREMITY ROM:   WFL   Active  Right Eval Left Eval  Hip flexion    Hip extension    Hip abduction    Hip adduction    Hip internal rotation    Hip external rotation    Knee flexion    Knee extension    Ankle dorsiflexion    Ankle plantarflexion    Ankle inversion    Ankle eversion     (Blank rows = not tested)  LOWER EXTREMITY MMT:    MMT Right Eval Left Eval  Hip flexion 5/5 5/5  Hip extension    Hip abduction 5/5 5/5  Hip adduction 5/5 5/5  Hip internal rotation    Hip  external rotation    Knee flexion 5/5 5/5  Knee extension 5/5 5/5  Ankle dorsiflexion    Ankle plantarflexion    Ankle inversion    Ankle eversion    (Blank rows = not tested)  BED MOBILITY:  Sit to supine Complete Independence and Modified independence Supine to sit Modified independence Rolling to Right Complete Independence Rolling to Left Complete Independence  TRANSFERS: Assistive device utilized: None  Sit to stand: Modified independence Stand to sit: Complete Independence Chair to chair: Modified independence Floor:  NT   STAIRS: Level of Assistance:  NT Stair Negotiation Technique:  with  Number of Stairs:   Height of Stairs:   Comments: NT  GAIT: Gait pattern: wide BOS, R lateral lean using wall to balance on the way back to room Distance walked: 50 ft Assistive device utilized: None Level of assistance: Min A with HHA from wife (pt did not want to, but was leaning) Comments: could potentially use AD  FUNCTIONAL TESTS:  5 times sit to stand: 9.26 seconds Berg Balance Scale: 48/56  PATIENT SURVEYS:  NT today                                                                                                                              TREATMENT DATE:  01/28/24 NuStep L5x39mins Walking with eyes focused on target forwards and backwards Head turns and nods while looking at a target and walking  Lateral steps on BOSU in bars- unsteady and needs UE support  Balance on BOSU Mini squat on BOSU- min-modA  On BOSU ball toss - modA    01/27/24 Standing VOR horizontal 2x60 seconds Standing VOR vertical 2x60 seconds- still more nauseous with cervical flexion/extension Standing marches on blue foam pad cues to stay in middle of pad x20  Standing on blue foam pad + cross midline reaching to target progressive difficulty (cues to find target with eyes/head before reaching for it) Standing on blue foam pad + cross midline toe taps to targets (cues to find target with  eyes/head before tapping) SLS with one foot on soft surface of BOSU/one foot on floor 3x30 seconds B  Alternating toe taps to BOSU from solid surface x20    01/21/24 NuStep L1 x52mins  Seated VOR x1 horizontal and vertical x15  Standing head turns horizontal and vertical x15-- dizzy and a little nauseous when looking up  Walking with head turns 5 steps down and back x2  Standing marches  Standing on airex reaching for numbers  Standing on airex ball toss then volleyball hits   01/20/24 Tandem gait on blue foam pad x3 laps  Vor horizontal 2x60 seconds  Side steps on foam x3 laps  Saccades horizontal and vertical STS with horizontal VOR x3  Vomited after side stepping on foam- provided warm washcloth and mentos   Seated UT stretch 2x30 seconds B Levator stretch 2x30 seconds B  Chin tucks x10 Mod cues     01/16/24  Tandem stance blue foam pad 3x30 seconds B intermittent BUE support on bars, min guard  Standing on blue foam pad with forward taps to targets x10 B, Min guard-MinA  Standing on blue foam pad with cross midline forward toe taps x10 B, min guard-MinA  Lateral shifts on rocker board x2 minutes, eyes on target to manage motion sensitivity, min guard  SLS on blue foam pad with other foot on soft surface of BOSU 3x30 seconds   Became nauseous and actively vomiting mid session, did well with warm wash clothes and cookie      01/14/24 Seated march 20 reps alt  LAQ x10 each side  VOR x1 horizontal and vertical seated VOR x2 horizontal and vertical seated Feet together, EC feet together  Romberg stance, tandem standing 30s  SLS 10s  Standing on airex feet together Standing on airex head turns  On airex playing catch   01/03/24    PATIENT EDUCATION: Education details: Diagnosis, Prognosis, POC, holding off on HEP Person educated: Patient and Spouse Education method: Explanation, Demonstration, Tactile cues, and Verbal cues Education comprehension: verbalized  understanding, returned demonstration, verbal cues required, tactile cues required, and needs further education  HOME EXERCISE PROGRAM:  Access Code: [email or web address] + up and walking 5-10 minutes every hour awake  URL: [email or web address] Date: 01/20/2024 Prepared by: Nedra Hai  Exercises - Tandem Stance in Corner  - 1-2 x daily - 7 x weekly - 1 sets - 3 reps - 30 seconds  hold - Seated Gaze Stabilization with Head Rotation  - 1-2 x daily - 7 x weekly - 1 sets - 2 reps - 60 seconds  hold   GOALS: Goals reviewed with patient? No  SHORT TERM GOALS: Target date: 01/25/24  Initial HEP will be prescribed by 3rd visit Baseline: not provided  Goal status: MET 01/20/24  2.  Headache pain will not increase above 7/10 during session.  Baseline: started at 7/10, pt verbalized it was worse at  end of eval  Goal status: ongoing 01/21/24, MET 01/28/24  3.  Activity increase will not elicit vomiting. Baseline: vomited at end of eval after BERG testing. Goal status: ongoing 01/21/24, doing harder tasks in sessions no vomiting in 3 sessions 01/28/24   LONG TERM GOALS: Target date: 03/09/24  Pt will be independent with advanced HEP, as prescribed throughout POC. Baseline: not prescribed yet Goal status: INITIAL  2.  Pt will decrease HA pain to </= 4/10 with activity.  Baseline: > 7/10 Goal status: INITIAL  3.  Pt will increase BERG balance test score to >/= 52/56. Baseline: 48/56 Goal status: INITIAL  4.  Pt will complete full session without vomiting or any other adverse reactions.  Baseline: vomited after eval Goal status: progressing 2 sessions back to back w/vomit, 2 sessions without     ASSESSMENT:  CLINICAL IMPRESSION: Pt arrives today doing OK, he reports that he is slowly feeling better every day. Was a little bit queasy after walking with head nods. It seems that vertical head nods, and looking up increases nausea. He is trying to increase activity tolerance daily,  but has noticed if he does too much he is unable to do much the next few days. Quick movements still make him dizzy and make him vomit. He does really well with activities on BOSU today. Will continue to progress as able.    OBJECTIVE IMPAIRMENTS: decreased activity tolerance, decreased balance, difficulty walking, decreased safety awareness, impaired perceived functional ability, increased muscle spasms, and pain.   ACTIVITY LIMITATIONS: standing, stairs, and locomotion level  PARTICIPATION LIMITATIONS: cleaning, laundry, driving, shopping, community activity, and occupation  PERSONAL FACTORS: 1 comorbidity: history of stroke  are also affecting patient's functional outcome.   REHAB POTENTIAL: Good  CLINICAL DECISION MAKING: Evolving/moderate complexity  EVALUATION COMPLEXITY: Moderate  PLAN:  PT FREQUENCY: 1-2x/week  PT DURATION: 8 weeks  PLANNED INTERVENTIONS: 97164- PT Re-evaluation, 97110-Therapeutic exercises, 97530- Therapeutic activity, [id number]- Neuromuscular re-education, 97535- Self Care, 16109- Manual therapy, and 97116- Gait training  PLAN FOR NEXT SESSION: redo BERG and assess risk for falls    Cassie Freer, PT, DPT 01/28/24 4:31 PM

## 2024-01-29 ENCOUNTER — Ambulatory Visit: Payer: 59 | Admitting: Occupational Therapy

## 2024-01-30 ENCOUNTER — Ambulatory Visit: Payer: 59 | Admitting: Occupational Therapy

## 2024-01-30 ENCOUNTER — Encounter: Payer: Self-pay | Admitting: Occupational Therapy

## 2024-01-30 DIAGNOSIS — R278 Other lack of coordination: Secondary | ICD-10-CM

## 2024-01-30 DIAGNOSIS — R41842 Visuospatial deficit: Secondary | ICD-10-CM

## 2024-01-30 DIAGNOSIS — R2689 Other abnormalities of gait and mobility: Secondary | ICD-10-CM | POA: Diagnosis not present

## 2024-01-30 DIAGNOSIS — M6281 Muscle weakness (generalized): Secondary | ICD-10-CM

## 2024-01-30 DIAGNOSIS — R42 Dizziness and giddiness: Secondary | ICD-10-CM

## 2024-01-30 NOTE — Therapy (Signed)
 OUTPATIENT OCCUPATIONAL THERAPY NEURO Treatment  Patient Name: Wayne Lee MRN: [id number] DOB:[birth year], 48 y.o., male Today's Date: 01/30/2024  PCP: none REFERRING PROVIDER: Dr. Shearon Stalls  END OF SESSION:  OT End of Session - 01/30/24 1328     Visit Number 4    Number of Visits 25    Date for OT Re-Evaluation 03/31/24    Authorization Type [employer]    Authorization Time Period 12 weeks    OT Start Time 1320    OT Stop Time 1400    OT Time Calculation (min) 40 min    Activity Tolerance Patient tolerated treatment well    Behavior During Therapy WFL for tasks assessed/performed   pt vomited end of session               Past Medical History:  Diagnosis Date   Atopic dermatitis in adult    Left cataract    Obesity (BMI 30-39.9)    Stroke [facility])    Undescended left testicle    had surgical intervention   Past Surgical History:  Procedure Laterality Date   CATARACT EXTRACTION W/ INTRAOCULAR LENS IMPLANT Left 2017   CHOLECYSTECTOMY     ORCHIOPEXY  1984   SUBOCCIPITAL CRANIECTOMY CERVICAL LAMINECTOMY N/A 12/16/2023   Procedure: SUBOCCIPITAL CRANIECTOMY;  Surgeon: Barnett Abu, MD;  Location: [facility] OR;  Service: Neurosurgery;  Laterality: N/A;   Patient Active Problem List   Diagnosis Date Noted   Nausea and vomiting 01/13/2024   Central nervous system origin vertigo, unspecified laterality 01/13/2024   Impaired mobility and ADLs 01/13/2024   Bilateral headaches 12/31/2023   ICH (intracerebral hemorrhage) (HCC) 12/20/2023   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   Hypertension 12/19/2023   Brain compression (HCC) 12/19/2023   Nontraumatic acute cerebral hemorrhage (HCC) 12/16/2023   Cerebellar hemorrhage (HCC) 12/16/2023    ONSET DATE: 12/16/23  REFERRING DIAG: I61.9 (ICD-10-CM) - Nontraumatic acute cerebral hemorrhage (HCC)   THERAPY DIAG:  Muscle weakness (generalized)  Other lack of coordination  Visuospatial deficit  Other abnormalities  of gait and mobility  Dizziness and giddiness  Rationale for Evaluation and Treatment: Rehabilitation  SUBJECTIVE:   SUBJECTIVE STATEMENT: Pt reports doesn't have any pain today Pt accompanied by: significant other  PERTINENT HISTORY: 48 yo male arrival 1/6 with HTN 204/106 taken to emergent OR hematoma evacuation s/p suboccipital craniotomy and hypertonic saline started. Extubated 1/7 PMH obesity, sleep apnea, HLD d/c home 1/21/5 Per pt's wife AVM  CT Head without contrast: Large acute 5.3 cm intraparenchymal hemorrhage in the left cerebellum. Mass effect with narrowed fourth ventricle, but no hydrocephalus at this time. Basal cisterns are effaced and there is early ascending transtentorial herniation. PRECAUTIONS: Fall  WEIGHT BEARING RESTRICTIONS: No  PAIN: no pain today FALLS: Has patient fallen in last 6 months? No  LIVING ENVIRONMENT: Lives with: lives with their family and lives with their spouse Lives in: House/apartment Stairs: yes  Has following equipment at home: None  PLOF: Independent  PATIENT GOALS: return to prior level  OBJECTIVE:  Note: Objective measures were completed at Evaluation unless otherwise noted.  HAND DOMINANCE: Right  ADLs: Overall ADLs: mod I-supervision with basic ADLS Transfers/ambulation related to ADLs: Eating: mod I Grooming: mod I UB Dressing: mod I LB Dressing: mod I Toileting: mod I Bathing: supervision, sits in bottom of bathtub Tub Shower transfers: supervision   IADLs:dependent for IADLS Medication management: wife is Teacher, English as a foreign language: dependent   MOBILITY STATUS:  mod I-supervision    ACTIVITY TOLERANCE: Activity tolerance:  limited by nausea   UPPER EXTREMITY ROM:  WFLS   UPPER EXTREMITY MMT:     MMT Right eval Left eval  Shoulder flexion 4+/5 4/5  Shoulder abduction    Shoulder adduction    Shoulder extension    Shoulder internal rotation    Shoulder external rotation    Middle  trapezius    Lower trapezius    Elbow flexion 4+/5 4+/5  Elbow extension 4+/5 4/5  Wrist flexion    Wrist extension    Wrist ulnar deviation    Wrist radial deviation    Wrist pronation    Wrist supination    (Blank rows = not tested)  HAND FUNCTION: Grip strength: Right: 63 lbs; Left: 48 lbs  COORDINATION: 9 Hole Peg test: Right: 26.25 sec; Left: 32.80 with several drops sec  SENSATION: WFL  COGNITION: Overall cognitive status:  recalls 3/3 words following short delay, spells WORLD backwards without difficulty  VISION: Subjective report: Pt reports double vision at times   VISION ASSESSMENT: To be further assessed in functional context Pt reports diplopia at times He was able to track to all 4 quadrants and visual fiels were intact. He became nauseous and vomited shortly after vision testing. Pt appears to have some vestibular issues.  Patient has difficulty with following activities due to following visual impairments: reading    OBSERVATIONS: Pleasant agreeable male, accompanied by his wife and 1 y.o son Pt's BP was elevated at end of session when he was vomiting 140/102, and 148/115, pt was instructed to monitor at home and to contact MD if diastolic BP does not reduce to below 100. pt's wife verbalized understanding                                                                                                                             TREATMENT DATE:01/30/24- Constant therapy:Put steps in order( for reading activity), 100% accuracy, no increased dizziness.  Followed by reading a map 80% acuracy, 35.52 response time, due to pt moving quickly and mistakenly hitting one itme and one error due to not double checking. Reading accuracy 80% accuracy. Grooved pegs for increased fine motor coordination, min difficulty/ drops, min v.c Environmental scanning to locate items 1-9 in squential order, pt with good success. Amb while tossing ball and naming animals for every  letter of alphabet, min questionsing cues. 01/22/24- see pt. education Copying small peg design with LUE for fine motor coordination with a visual componant, min difficulty/ drops for coordination. Pt copied design correctly without v.c Number cancellation 72M with 100% accuracy and no reports of dizziness nausea    01/15/24-coordiantion HEP issued, see education  01/07/24 eval        PATIENT EDUCATION: Education details: see above Person educated: Patient and Spouse Education method: Explanation, demonstration, v.c  Education comprehension: verbalized understanding, returned demonstration,   HOME EXERCISE PROGRAM: coordination, putty   GOALS: Goals reviewed with patient? Yes  SHORT TERM GOALS: Target date:  02/06/24  I with HEP  Goal status: met, 01/22/24- demonstrates understanding of putty exercises  2.  Pt will demonstrate improved fine motor coordination as evidenced by decreasing 9 hole peg test LUE to 29 secs or less without several drops.  Goal status: ongoing  3.  Pt will increase LUE grip strength to 52 lbs or greater for increased ease with daily activities.  Goal status: ongoing  4.  Pt will perfrom tabletop scanning wihout diplopia or vomiting with 90% or better accuracy.  Goal status: INITIAL  5.  Pt will perfrom transitional movments for ADLS/ IADLs without LOB or nausea greater than 3/10.  Goal status: INITIAL  6. I with adpaed strategies for ADLs/IADLs to improve safety and I.   Goal status: INITIAL  LONG TERM GOALS: Target date: 03/31/24  I with updated HEP  Goal status: INITIAL  2.  Pt will perform mod complex home management mod I  Goal status: INITIAL  3.  Pt will perform environmental scanning in a busy environment with 90% or better accuracy, without vomiting  Goal status: INITIAL  4.  Pt will perfrom simulated work activities mod I  Goal status: INITIAL    ASSESSMENT:  CLINICAL IMPRESSION: Pt is progressing towards goals. He  was able to tolerate therapy without vomiting. He demonstrates improved visual scanning and coordination. Pt needs cueing to slow down with activities.PERFORMANCE DEFICITS: in functional skills including ADLs, IADLs, coordination, dexterity, ROM, strength, Fine motor control, Gross motor control, balance, endurance, decreased knowledge of precautions, decreased knowledge of use of DME, vision, UE functional use, and vestibular, cognitive skills including safety awareness, and psychosocial skills including coping strategies, environmental adaptation, habits, interpersonal interactions, and routines and behaviors.   IMPAIRMENTS: are limiting patient from ADLs, IADLs, rest and sleep, work, play, leisure, and social participation.   CO-MORBIDITIES: may have co-morbidities  that affects occupational performance. Patient will benefit from skilled OT to address above impairments and improve overall function.  MODIFICATION OR ASSISTANCE TO COMPLETE EVALUATION: Min-Moderate modification of tasks or assist with assess necessary to complete an evaluation.  OT OCCUPATIONAL PROFILE AND HISTORY: Detailed assessment: Review of records and additional review of physical, cognitive, psychosocial history related to current functional performance.  CLINICAL DECISION MAKING: LOW - limited treatment options, no task modification necessary  REHAB POTENTIAL: Good  EVALUATION COMPLEXITY: Low    PLAN:  OT FREQUENCY: 2x/week plus eval  OT DURATION: 12 weeks  PLANNED INTERVENTIONS: 97168 OT Re-evaluation, 97535 self care/ADL training, 29562 therapeutic exercise, 97530 therapeutic activity, 97112 neuromuscular re-education, 97140 manual therapy, 97116 gait training, 13086 aquatic therapy, 97035 ultrasound, 97018 paraffin, 57846 moist heat, 97010 cryotherapy, 97034 contrast bath, balance training, functional mobility training, visual/perceptual remediation/compensation, energy conservation, coping strategies training,  patient/family education, and DME and/or AE instructions  RECOMMENDED OTHER SERVICES: PT  CONSULTED AND AGREED WITH PLAN OF CARE: Patient  PLAN FOR NEXT SESSION:  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

## 2024-01-31 ENCOUNTER — Telehealth: Payer: Self-pay | Admitting: *Deleted

## 2024-01-31 DIAGNOSIS — I619 Nontraumatic intracerebral hemorrhage, unspecified: Secondary | ICD-10-CM

## 2024-01-31 DIAGNOSIS — H814 Vertigo of central origin: Secondary | ICD-10-CM

## 2024-01-31 DIAGNOSIS — R112 Nausea with vomiting, unspecified: Secondary | ICD-10-CM

## 2024-01-31 NOTE — Telephone Encounter (Signed)
 Mrs Perkey called and reports that the FMLA paperwork filled out in the hospital was never sent to his work. They are needing RTW date and she reports it was originally the 6th but he may need a little more time. She is requesting a call and she can fill in on the needs. I have let her know Dr Shearon Stalls is out of the office until Monday.

## 2024-01-31 NOTE — Therapy (Signed)
 OUTPATIENT PHYSICAL THERAPY NEURO TREATMENT   Patient Name: Wayne Lee MRN: [id number] DOB:[birth year], 48 y.o., male Today's Date: 02/03/2024   PCP: No PCP REFERRING PROVIDER: Delle Reining, PA-C  END OF SESSION:  PT End of Session - 02/03/24 1530     Visit Number 8    Number of Visits 16    Date for PT Re-Evaluation 03/27/24    Authorization Type [employer]    PT Start Time 1530    PT Stop Time 1615    PT Time Calculation (min) 45 min    Activity Tolerance Patient tolerated treatment well    Behavior During Therapy WFL for tasks assessed/performed                    Past Medical History:  Diagnosis Date   Atopic dermatitis in adult    Left cataract    Obesity (BMI 30-39.9)    Stroke [facility])    Undescended left testicle    had surgical intervention   Past Surgical History:  Procedure Laterality Date   CATARACT EXTRACTION W/ INTRAOCULAR LENS IMPLANT Left 2017   CHOLECYSTECTOMY     ORCHIOPEXY  1984   SUBOCCIPITAL CRANIECTOMY CERVICAL LAMINECTOMY N/A 12/16/2023   Procedure: SUBOCCIPITAL CRANIECTOMY;  Surgeon: Barnett Abu, MD;  Location: [facility] OR;  Service: Neurosurgery;  Laterality: N/A;   Patient Active Problem List   Diagnosis Date Noted   Nausea and vomiting 01/13/2024   Central nervous system origin vertigo, unspecified laterality 01/13/2024   Impaired mobility and ADLs 01/13/2024   Bilateral headaches 12/31/2023   ICH (intracerebral hemorrhage) (HCC) 12/20/2023   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   Hypertension 12/19/2023   Brain compression (HCC) 12/19/2023   Nontraumatic acute cerebral hemorrhage (HCC) 12/16/2023   Cerebellar hemorrhage (HCC) 12/16/2023    ONSET DATE: 12/15/22  REFERRING DIAG:  Diagnosis  I61.9 (ICD-10-CM) - Nontraumatic acute cerebral hemorrhage (HCC)    THERAPY DIAG:  Muscle weakness (generalized)  Other lack of coordination  Other abnormalities of gait and mobility  Difficulty in  walking, not elsewhere classified  Dizziness and giddiness  Rationale for Evaluation and Treatment: Rehabilitation  SUBJECTIVE:                                                                                                                                                                                             SUBJECTIVE STATEMENT: I have thrown up 3 times today, I think its the vertigo and maybe moving too quick.   Pt accompanied by: self  PERTINENT HISTORY: 1/6 stroke, ICU and IRF leaving hospital 1/21  PAIN:  Are you having pain?  No 0/10  PRECAUTIONS: None  RED FLAGS: None   WEIGHT BEARING RESTRICTIONS: No  FALLS: Has patient fallen in last 6 months? No  LIVING ENVIRONMENT: Lives with: lives with their family and lives with their spouse Lives in: House/apartment Stairs: Yes: Internal: 15 steps; can reach both and External: 2 steps; none front door 4 steps rails on both sides Has following equipment at home: None  PLOF:  supervision for I/ADLs  PATIENT GOALS: walking, energy, endurance  OBJECTIVE:  Note: Objective measures were completed at Evaluation unless otherwise noted.  DIAGNOSTIC FINDINGS:  Nothing new  COGNITION: Overall cognitive status: Within functional limits for tasks assessed   SENSATION: WFL  COORDINATION: Toe taps and heel-to-shin normal  EDEMA:  None  MUSCLE TONE: RLE: Within functional limits  MUSCLE LENGTH: NT  POSTURE:  very wide BOS   LOWER EXTREMITY ROM:   WFL   Active  Right Eval Left Eval  Hip flexion    Hip extension    Hip abduction    Hip adduction    Hip internal rotation    Hip external rotation    Knee flexion    Knee extension    Ankle dorsiflexion    Ankle plantarflexion    Ankle inversion    Ankle eversion     (Blank rows = not tested)  LOWER EXTREMITY MMT:    MMT Right Eval Left Eval  Hip flexion 5/5 5/5  Hip extension    Hip abduction 5/5 5/5  Hip adduction 5/5 5/5  Hip internal  rotation    Hip external rotation    Knee flexion 5/5 5/5  Knee extension 5/5 5/5  Ankle dorsiflexion    Ankle plantarflexion    Ankle inversion    Ankle eversion    (Blank rows = not tested)  BED MOBILITY:  Sit to supine Complete Independence and Modified independence Supine to sit Modified independence Rolling to Right Complete Independence Rolling to Left Complete Independence  TRANSFERS: Assistive device utilized: None  Sit to stand: Modified independence Stand to sit: Complete Independence Chair to chair: Modified independence Floor:  NT   STAIRS: Level of Assistance:  NT Stair Negotiation Technique:  with  Number of Stairs:   Height of Stairs:   Comments: NT  GAIT: Gait pattern: wide BOS, R lateral lean using wall to balance on the way back to room Distance walked: 50 ft Assistive device utilized: None Level of assistance: Min A with HHA from wife (pt did not want to, but was leaning) Comments: could potentially use AD  FUNCTIONAL TESTS:  5 times sit to stand: 9.26 seconds Berg Balance Scale: 48/56  PATIENT SURVEYS:  NT today                                                                                                                              TREATMENT DATE:  02/03/24 Bike L3 x55mins  Resisted gait 30# 3 way x3  Step ups 6"  Walking with ball toss and direction changes  Step ups on airex- minA  Side steps on airex- minA  Tandem walking  On airex shoulder ext 2x10  STS on airex with ball toss 2x10  01/28/24 NuStep L5x71mins Walking with eyes focused on target forwards and backwards Head turns and nods while looking at a target and walking Lateral steps on BOSU in bars- unsteady and needs UE support  Balance on BOSU Mini squat on BOSU- min-modA  On BOSU ball toss - modA    01/27/24 Standing VOR horizontal 2x60 seconds Standing VOR vertical 2x60 seconds- still more nauseous with cervical flexion/extension Standing marches on blue foam pad  cues to stay in middle of pad x20  Standing on blue foam pad + cross midline reaching to target progressive difficulty (cues to find target with eyes/head before reaching for it) Standing on blue foam pad + cross midline toe taps to targets (cues to find target with eyes/head before tapping) SLS with one foot on soft surface of BOSU/one foot on floor 3x30 seconds B  Alternating toe taps to BOSU from solid surface x20    01/21/24 NuStep L1 x28mins  Seated VOR x1 horizontal and vertical x15  Standing head turns horizontal and vertical x15-- dizzy and a little nauseous when looking up  Walking with head turns 5 steps down and back x2  Standing marches  Standing on airex reaching for numbers  Standing on airex ball toss then volleyball hits   01/20/24 Tandem gait on blue foam pad x3 laps  Vor horizontal 2x60 seconds  Side steps on foam x3 laps  Saccades horizontal and vertical STS with horizontal VOR x3  Vomited after side stepping on foam- provided warm washcloth and mentos   Seated UT stretch 2x30 seconds B Levator stretch 2x30 seconds B  Chin tucks x10 Mod cues     01/16/24  Tandem stance blue foam pad 3x30 seconds B intermittent BUE support on bars, min guard  Standing on blue foam pad with forward taps to targets x10 B, Min guard-MinA  Standing on blue foam pad with cross midline forward toe taps x10 B, min guard-MinA  Lateral shifts on rocker board x2 minutes, eyes on target to manage motion sensitivity, min guard  SLS on blue foam pad with other foot on soft surface of BOSU 3x30 seconds   Became nauseous and actively vomiting mid session, did well with warm wash clothes and cookie      01/14/24 Seated march 20 reps alt  LAQ x10 each side  VOR x1 horizontal and vertical seated VOR x2 horizontal and vertical seated Feet together, EC feet together  Romberg stance, tandem standing 30s  SLS 10s  Standing on airex feet together Standing on airex head turns  On airex  playing catch   01/03/24    PATIENT EDUCATION: Education details: Diagnosis, Prognosis, POC, holding off on HEP Person educated: Patient and Spouse Education method: Explanation, Demonstration, Tactile cues, and Verbal cues Education comprehension: verbalized understanding, returned demonstration, verbal cues required, tactile cues required, and needs further education  HOME EXERCISE PROGRAM:  Access Code: [email or web address] + up and walking 5-10 minutes every hour awake  URL: [email or web address] Date: 01/20/2024 Prepared by: Nedra Hai  Exercises - Tandem Stance in Corner  - 1-2 x daily - 7 x weekly - 1 sets - 3 reps - 30 seconds  hold - Seated Gaze Stabilization with Head Rotation  - 1-2 x daily - 7 x weekly - 1 sets - 2 reps -  60 seconds  hold   GOALS: Goals reviewed with patient? No  SHORT TERM GOALS: Target date: 01/25/24  Initial HEP will be prescribed by 3rd visit Baseline: not provided  Goal status: MET 01/20/24  2.  Headache pain will not increase above 7/10 during session.  Baseline: started at 7/10, pt verbalized it was worse at end of eval  Goal status: ongoing 01/21/24, MET 01/28/24  3.  Activity increase will not elicit vomiting. Baseline: vomited at end of eval after BERG testing. Goal status: ongoing 01/21/24, doing harder tasks in sessions no vomiting in 3 sessions 01/28/24   LONG TERM GOALS: Target date: 03/09/24  Pt will be independent with advanced HEP, as prescribed throughout POC. Baseline: not prescribed yet Goal status: INITIAL  2.  Pt will decrease HA pain to </= 4/10 with activity.  Baseline: > 7/10 Goal status: INITIAL  3.  Pt will increase BERG balance test score to >/= 52/56. Baseline: 48/56 Goal status: INITIAL  4.  Pt will complete full session without vomiting or any other adverse reactions.  Baseline: vomited after eval Goal status: progressing 2 sessions back to back w/vomit, 2 sessions without     ASSESSMENT:  CLINICAL  IMPRESSION: Pt arrives today doing OK, he reports that he is slowly feeling better every day. Today he has vomited 3x already. States it may be from vertigo and moving too quick. Slight LOB with resisted gait and step ups on airex pad. Able to use good stepping strategies to prevent falling. Does well with higher level balance and coordination activities today. Requires cues to slow down at times. Will continue to progress as able.     OBJECTIVE IMPAIRMENTS: decreased activity tolerance, decreased balance, difficulty walking, decreased safety awareness, impaired perceived functional ability, increased muscle spasms, and pain.   ACTIVITY LIMITATIONS: standing, stairs, and locomotion level  PARTICIPATION LIMITATIONS: cleaning, laundry, driving, shopping, community activity, and occupation  PERSONAL FACTORS: 1 comorbidity: history of stroke  are also affecting patient's functional outcome.   REHAB POTENTIAL: Good  CLINICAL DECISION MAKING: Evolving/moderate complexity  EVALUATION COMPLEXITY: Moderate  PLAN:  PT FREQUENCY: 1-2x/week  PT DURATION: 8 weeks  PLANNED INTERVENTIONS: 97164- PT Re-evaluation, 97110-Therapeutic exercises, 97530- Therapeutic activity, [id number]- Neuromuscular re-education, 97535- Self Care, 84132- Manual therapy, and 97116- Gait training  PLAN FOR NEXT SESSION: redo BERG and assess risk for falls    Cassie Freer, PT, DPT 02/03/24 4:14 PM

## 2024-02-03 ENCOUNTER — Ambulatory Visit: Payer: 59

## 2024-02-03 DIAGNOSIS — M6281 Muscle weakness (generalized): Secondary | ICD-10-CM

## 2024-02-03 DIAGNOSIS — R2689 Other abnormalities of gait and mobility: Secondary | ICD-10-CM | POA: Diagnosis not present

## 2024-02-03 DIAGNOSIS — R42 Dizziness and giddiness: Secondary | ICD-10-CM

## 2024-02-03 DIAGNOSIS — R262 Difficulty in walking, not elsewhere classified: Secondary | ICD-10-CM

## 2024-02-03 DIAGNOSIS — R278 Other lack of coordination: Secondary | ICD-10-CM

## 2024-02-03 MED ORDER — MECLIZINE HCL 50 MG PO TABS: 25.0000 mg | ORAL_TABLET | Freq: Three times a day (TID) | ORAL | 2 refills | Status: DC | PRN

## 2024-02-03 MED ORDER — PROCHLORPERAZINE MALEATE 5 MG PO TABS: 5.0000 mg | ORAL_TABLET | Freq: Three times a day (TID) | ORAL | 1 refills | Status: DC | PRN

## 2024-02-03 MED ORDER — SCOPOLAMINE 1 MG/3DAYS TD PT72: 1.0000 | MEDICATED_PATCH | TRANSDERMAL | 5 refills | Status: DC

## 2024-02-03 NOTE — Therapy (Signed)
 OUTPATIENT PHYSICAL THERAPY NEURO TREATMENT   Patient Name: Wayne Lee MRN: [id number] DOB:Feb 23, 1976, 48 y.o., male Today's Date: 02/04/2024   PCP: No PCP REFERRING PROVIDER: Delle Reining, PA-C  END OF SESSION:  PT End of Session - 02/04/24 1633     Visit Number 9    Number of Visits 16    Date for PT Re-Evaluation 03/27/24    Authorization Type [employer]    PT Start Time 1630    PT Stop Time 1715    PT Time Calculation (min) 45 min    Activity Tolerance Patient tolerated treatment well    Behavior During Therapy WFL for tasks assessed/performed                     Past Medical History:  Diagnosis Date   Atopic dermatitis in adult    Left cataract    Obesity (BMI 30-39.9)    Stroke [facility])    Undescended left testicle    had surgical intervention   Past Surgical History:  Procedure Laterality Date   CATARACT EXTRACTION W/ INTRAOCULAR LENS IMPLANT Left 2017   CHOLECYSTECTOMY     ORCHIOPEXY  1984   SUBOCCIPITAL CRANIECTOMY CERVICAL LAMINECTOMY N/A 12/16/2023   Procedure: SUBOCCIPITAL CRANIECTOMY;  Surgeon: Barnett Abu, MD;  Location: [facility] OR;  Service: Neurosurgery;  Laterality: N/A;   Patient Active Problem List   Diagnosis Date Noted   Nausea and vomiting 01/13/2024   Central nervous system origin vertigo, unspecified laterality 01/13/2024   Impaired mobility and ADLs 01/13/2024   Bilateral headaches 12/31/2023   ICH (intracerebral hemorrhage) (HCC) 12/20/2023   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   Hypertension 12/19/2023   Brain compression (HCC) 12/19/2023   Nontraumatic acute cerebral hemorrhage (HCC) 12/16/2023   Cerebellar hemorrhage (HCC) 12/16/2023    ONSET DATE: 12/15/22  REFERRING DIAG:  Diagnosis  I61.9 (ICD-10-CM) - Nontraumatic acute cerebral hemorrhage (HCC)    THERAPY DIAG:  Muscle weakness (generalized)  Other lack of coordination  Other abnormalities of gait and mobility  Difficulty in  walking, not elsewhere classified  Dizziness and giddiness  Nontraumatic acute cerebral hemorrhage (HCC)  Rationale for Evaluation and Treatment: Rehabilitation  SUBJECTIVE:                                                                                                                                                                                             SUBJECTIVE STATEMENT: I am alright. Threw up before I came here.   Pt accompanied by: self  PERTINENT HISTORY: 1/6 stroke, ICU and IRF leaving hospital 1/21  PAIN:  Are you having pain? No  0/10  PRECAUTIONS: None  RED FLAGS: None   WEIGHT BEARING RESTRICTIONS: No  FALLS: Has patient fallen in last 6 months? No  LIVING ENVIRONMENT: Lives with: lives with their family and lives with their spouse Lives in: House/apartment Stairs: Yes: Internal: 15 steps; can reach both and External: 2 steps; none front door 4 steps rails on both sides Has following equipment at home: None  PLOF:  supervision for I/ADLs  PATIENT GOALS: walking, energy, endurance  OBJECTIVE:  Note: Objective measures were completed at Evaluation unless otherwise noted.  DIAGNOSTIC FINDINGS:  Nothing new  COGNITION: Overall cognitive status: Within functional limits for tasks assessed   SENSATION: WFL  COORDINATION: Toe taps and heel-to-shin normal  EDEMA:  None  MUSCLE TONE: RLE: Within functional limits  MUSCLE LENGTH: NT  POSTURE:  very wide BOS   LOWER EXTREMITY ROM:   WFL   Active  Right Eval Left Eval  Hip flexion    Hip extension    Hip abduction    Hip adduction    Hip internal rotation    Hip external rotation    Knee flexion    Knee extension    Ankle dorsiflexion    Ankle plantarflexion    Ankle inversion    Ankle eversion     (Blank rows = not tested)  LOWER EXTREMITY MMT:    MMT Right Eval Left Eval  Hip flexion 5/5 5/5  Hip extension    Hip abduction 5/5 5/5  Hip adduction 5/5 5/5  Hip internal  rotation    Hip external rotation    Knee flexion 5/5 5/5  Knee extension 5/5 5/5  Ankle dorsiflexion    Ankle plantarflexion    Ankle inversion    Ankle eversion    (Blank rows = not tested)  BED MOBILITY:  Sit to supine Complete Independence and Modified independence Supine to sit Modified independence Rolling to Right Complete Independence Rolling to Left Complete Independence  TRANSFERS: Assistive device utilized: None  Sit to stand: Modified independence Stand to sit: Complete Independence Chair to chair: Modified independence Floor:  NT   STAIRS: Level of Assistance:  NT Stair Negotiation Technique:  with  Number of Stairs:   Height of Stairs:   Comments: NT  GAIT: Gait pattern: wide BOS, R lateral lean using wall to balance on the way back to room Distance walked: 50 ft Assistive device utilized: None Level of assistance: Min A with HHA from wife (pt did not want to, but was leaning) Comments: could potentially use AD  FUNCTIONAL TESTS:  5 times sit to stand: 9.26 seconds Berg Balance Scale: 48/56  PATIENT SURVEYS:  NT today                                                                                                                              TREATMENT DATE:  02/04/24 Redo BERG 54/56 Walking on grass  Walking big lap around back building NuStep L5  x49mins Treadmill pushes 30s x2 Walking on beam  Standing on 2 dyna disc  Step ups on BOSU x5 each side Side steps on BOSU   02/03/24 Bike L3 x63mins  Resisted gait 30# 3 way x3  Step ups 6"  Walking with ball toss and direction changes  Step ups on airex- minA  Side steps on airex- minA  Tandem walking  On airex shoulder ext 2x10  STS on airex with ball toss 2x10  01/28/24 NuStep L5x29mins Walking with eyes focused on target forwards and backwards Head turns and nods while looking at a target and walking Lateral steps on BOSU in bars- unsteady and needs UE support  Balance on BOSU Mini squat  on BOSU- min-modA  On BOSU ball toss - modA    01/27/24 Standing VOR horizontal 2x60 seconds Standing VOR vertical 2x60 seconds- still more nauseous with cervical flexion/extension Standing marches on blue foam pad cues to stay in middle of pad x20  Standing on blue foam pad + cross midline reaching to target progressive difficulty (cues to find target with eyes/head before reaching for it) Standing on blue foam pad + cross midline toe taps to targets (cues to find target with eyes/head before tapping) SLS with one foot on soft surface of BOSU/one foot on floor 3x30 seconds B  Alternating toe taps to BOSU from solid surface x20    01/21/24 NuStep L1 x6mins  Seated VOR x1 horizontal and vertical x15  Standing head turns horizontal and vertical x15-- dizzy and a little nauseous when looking up  Walking with head turns 5 steps down and back x2  Standing marches  Standing on airex reaching for numbers  Standing on airex ball toss then volleyball hits   01/20/24 Tandem gait on blue foam pad x3 laps  Vor horizontal 2x60 seconds  Side steps on foam x3 laps  Saccades horizontal and vertical STS with horizontal VOR x3  Vomited after side stepping on foam- provided warm washcloth and mentos   Seated UT stretch 2x30 seconds B Levator stretch 2x30 seconds B  Chin tucks x10 Mod cues     01/16/24  Tandem stance blue foam pad 3x30 seconds B intermittent BUE support on bars, min guard  Standing on blue foam pad with forward taps to targets x10 B, Min guard-MinA  Standing on blue foam pad with cross midline forward toe taps x10 B, min guard-MinA  Lateral shifts on rocker board x2 minutes, eyes on target to manage motion sensitivity, min guard  SLS on blue foam pad with other foot on soft surface of BOSU 3x30 seconds   Became nauseous and actively vomiting mid session, did well with warm wash clothes and cookie      01/14/24 Seated march 20 reps alt  LAQ x10 each side  VOR x1  horizontal and vertical seated VOR x2 horizontal and vertical seated Feet together, EC feet together  Romberg stance, tandem standing 30s  SLS 10s  Standing on airex feet together Standing on airex head turns  On airex playing catch   01/03/24    PATIENT EDUCATION: Education details: Diagnosis, Prognosis, POC, holding off on HEP Person educated: Patient and Spouse Education method: Explanation, Demonstration, Tactile cues, and Verbal cues Education comprehension: verbalized understanding, returned demonstration, verbal cues required, tactile cues required, and needs further education  HOME EXERCISE PROGRAM:  Access Code: [email or web address] + up and walking 5-10 minutes every hour awake  URL: [email or web address] Date: 01/20/2024 Prepared by: Nedra Hai  Exercises - Tandem Stance  in Corner  - 1-2 x daily - 7 x weekly - 1 sets - 3 reps - 30 seconds  hold - Seated Gaze Stabilization with Head Rotation  - 1-2 x daily - 7 x weekly - 1 sets - 2 reps - 60 seconds  hold   GOALS: Goals reviewed with patient? No  SHORT TERM GOALS: Target date: 01/25/24  Initial HEP will be prescribed by 3rd visit Baseline: not provided  Goal status: MET 01/20/24  2.  Headache pain will not increase above 7/10 during session.  Baseline: started at 7/10, pt verbalized it was worse at end of eval  Goal status: ongoing 01/21/24, MET 01/28/24  3.  Activity increase will not elicit vomiting. Baseline: vomited at end of eval after BERG testing. Goal status: ongoing 01/21/24, doing harder tasks in sessions no vomiting in 3 sessions 01/28/24   LONG TERM GOALS: Target date: 03/09/24  Pt will be independent with advanced HEP, as prescribed throughout POC. Baseline: not prescribed yet Goal status: INITIAL  2.  Pt will decrease HA pain to </= 4/10 with activity.  Baseline: > 7/10 Goal status: INITIAL  3.  Pt will increase BERG balance test score to >/= 52/56. Baseline: 48/56 Goal status: MET  54/56 02/04/24  4.  Pt will complete full session without vomiting or any other adverse reactions.  Baseline: vomited after eval Goal status: progressing 2 sessions back to back w/vomit, 2 sessions without     ASSESSMENT:  CLINICAL IMPRESSION: Pt arrives today doing OK, he reports that he is slowly feeling better every day. We did some walking outdoors today. He veers to the left when walking on uneven surfaces. Does better on even the even concrete. Pt had difficulty with tandem walking on beam, frequent LOB but able to use good reaction times to catch himself with bars. Most difficulty with step ups on BOSU, he is unsteady and states he is unable to do without holding on to bars.    OBJECTIVE IMPAIRMENTS: decreased activity tolerance, decreased balance, difficulty walking, decreased safety awareness, impaired perceived functional ability, increased muscle spasms, and pain.   ACTIVITY LIMITATIONS: standing, stairs, and locomotion level  PARTICIPATION LIMITATIONS: cleaning, laundry, driving, shopping, community activity, and occupation  PERSONAL FACTORS: 1 comorbidity: history of stroke  are also affecting patient's functional outcome.   REHAB POTENTIAL: Good  CLINICAL DECISION MAKING: Evolving/moderate complexity  EVALUATION COMPLEXITY: Moderate  PLAN:  PT FREQUENCY: 1-2x/week  PT DURATION: 8 weeks  PLANNED INTERVENTIONS: 97164- PT Re-evaluation, 97110-Therapeutic exercises, 97530- Therapeutic activity, [id number]- Neuromuscular re-education, 97535- Self Care, [id number]- Manual therapy, and [id number]- Gait training  PLAN FOR NEXT SESSION:   Cassie Freer, PT, DPT 02/04/24 5:14 PM

## 2024-02-03 NOTE — Addendum Note (Signed)
 Addended by: Elijah Birk on: 02/03/2024 03:57 PM   Modules accepted: Orders

## 2024-02-03 NOTE — Telephone Encounter (Signed)
 Per patient's wife they got both FMLA forms submitted but were missing a return to work form; I'll get that filled out, we will extend his return date and work on accomodations for part time/[employer] when he does return.   He's doing MUCH better with the nausea and vomitting, still having occassional episodes but will go over a week between bouts and is doing more at home. I'm refilling his nausea and vertigo meds today. Thanks everyone!

## 2024-02-04 ENCOUNTER — Ambulatory Visit: Payer: 59

## 2024-02-04 DIAGNOSIS — R278 Other lack of coordination: Secondary | ICD-10-CM

## 2024-02-04 DIAGNOSIS — R42 Dizziness and giddiness: Secondary | ICD-10-CM

## 2024-02-04 DIAGNOSIS — R2689 Other abnormalities of gait and mobility: Secondary | ICD-10-CM | POA: Diagnosis not present

## 2024-02-04 DIAGNOSIS — R262 Difficulty in walking, not elsewhere classified: Secondary | ICD-10-CM

## 2024-02-04 DIAGNOSIS — I619 Nontraumatic intracerebral hemorrhage, unspecified: Secondary | ICD-10-CM

## 2024-02-04 DIAGNOSIS — M6281 Muscle weakness (generalized): Secondary | ICD-10-CM

## 2024-02-05 ENCOUNTER — Ambulatory Visit: Payer: 59 | Admitting: Occupational Therapy

## 2024-02-05 DIAGNOSIS — R2689 Other abnormalities of gait and mobility: Secondary | ICD-10-CM | POA: Diagnosis not present

## 2024-02-05 DIAGNOSIS — M6281 Muscle weakness (generalized): Secondary | ICD-10-CM

## 2024-02-05 DIAGNOSIS — R41842 Visuospatial deficit: Secondary | ICD-10-CM

## 2024-02-05 DIAGNOSIS — R278 Other lack of coordination: Secondary | ICD-10-CM

## 2024-02-05 NOTE — Therapy (Addendum)
 OUTPATIENT OCCUPATIONAL THERAPY NEURO Treatment  Patient Name: Wayne Lee MRN: [id number] DOB:[birth year], 48 y.o., male Today's Date: 02/05/2024  PCP: none REFERRING PROVIDER: Dr. Shearon Stalls  END OF SESSION:  OT End of Session - 02/05/24 1624     Visit Number 5    Number of Visits 25    Date for OT Re-Evaluation 03/31/24    Authorization Type aetna    Authorization Time Period 12 weeks    OT Start Time 1535    OT Stop Time 1615    OT Time Calculation (min) 40 min    Activity Tolerance Patient tolerated treatment well    Behavior During Therapy WFL for tasks assessed/performed   pt vomited end of session                Past Medical History:  Diagnosis Date   Atopic dermatitis in adult    Left cataract    Obesity (BMI 30-39.9)    Stroke [facility])    Undescended left testicle    had surgical intervention   Past Surgical History:  Procedure Laterality Date   CATARACT EXTRACTION W/ INTRAOCULAR LENS IMPLANT Left 2017   CHOLECYSTECTOMY     ORCHIOPEXY  1984   SUBOCCIPITAL CRANIECTOMY CERVICAL LAMINECTOMY N/A 12/16/2023   Procedure: SUBOCCIPITAL CRANIECTOMY;  Surgeon: Barnett Abu, MD;  Location: [facility] OR;  Service: Neurosurgery;  Laterality: N/A;   Patient Active Problem List   Diagnosis Date Noted   Nausea and vomiting 01/13/2024   Central nervous system origin vertigo, unspecified laterality 01/13/2024   Impaired mobility and ADLs 01/13/2024   Bilateral headaches 12/31/2023   ICH (intracerebral hemorrhage) (HCC) 12/20/2023   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   Hypertension 12/19/2023   Brain compression (HCC) 12/19/2023   Nontraumatic acute cerebral hemorrhage (HCC) 12/16/2023   Cerebellar hemorrhage (HCC) 12/16/2023    ONSET DATE: 12/16/23  REFERRING DIAG: I61.9 (ICD-10-CM) - Nontraumatic acute cerebral hemorrhage (HCC)   THERAPY DIAG:  Muscle weakness (generalized)  Other lack of coordination  Other abnormalities of gait and  mobility  Visuospatial deficit  Rationale for Evaluation and Treatment: Rehabilitation  SUBJECTIVE:   SUBJECTIVE STATEMENT: Pt reports doesn't have any pain today Pt accompanied by: significant other  PERTINENT HISTORY: 48 yo male arrival 1/6 with HTN 204/106 taken to emergent OR hematoma evacuation s/p suboccipital craniotomy and hypertonic saline started. Extubated 1/7 PMH obesity, sleep apnea, HLD d/c home 1/21/5 Per pt's wife AVM  CT Head without contrast: Large acute 5.3 cm intraparenchymal hemorrhage in the left cerebellum. Mass effect with narrowed fourth ventricle, but no hydrocephalus at this time. Basal cisterns are effaced and there is early ascending transtentorial herniation. PRECAUTIONS: Fall  WEIGHT BEARING RESTRICTIONS: No  PAIN: no pain today FALLS: Has patient fallen in last 6 months? No  LIVING ENVIRONMENT: Lives with: lives with their family and lives with their spouse Lives in: House/apartment Stairs: yes  Has following equipment at home: None  PLOF: Independent  PATIENT GOALS: return to prior level  OBJECTIVE:  Note: Objective measures were completed at Evaluation unless otherwise noted.  HAND DOMINANCE: Right  ADLs: Overall ADLs: mod I-supervision with basic ADLS Transfers/ambulation related to ADLs: Eating: mod I Grooming: mod I UB Dressing: mod I LB Dressing: mod I Toileting: mod I Bathing: supervision, sits in bottom of bathtub Tub Shower transfers: supervision   IADLs:dependent for IADLS Medication management: wife is Teacher, English as a foreign language: dependent   MOBILITY STATUS:  mod I-supervision    ACTIVITY TOLERANCE: Activity  tolerance: limited by nausea   UPPER EXTREMITY ROM:  WFLS   UPPER EXTREMITY MMT:     MMT Right eval Left eval  Shoulder flexion 4+/5 4/5  Shoulder abduction    Shoulder adduction    Shoulder extension    Shoulder internal rotation    Shoulder external rotation    Middle trapezius     Lower trapezius    Elbow flexion 4+/5 4+/5  Elbow extension 4+/5 4/5  Wrist flexion    Wrist extension    Wrist ulnar deviation    Wrist radial deviation    Wrist pronation    Wrist supination    (Blank rows = not tested)  HAND FUNCTION: Grip strength: Right: 63 lbs; Left: 48 lbs  COORDINATION: 9 Hole Peg test: Right: 26.25 sec; Left: 32.80 with several drops sec  SENSATION: WFL  COGNITION: Overall cognitive status:  recalls 3/3 words following short delay, spells WORLD backwards without difficulty  VISION: Subjective report: Pt reports double vision at times   VISION ASSESSMENT: To be further assessed in functional context Pt reports diplopia at times He was able to track to all 4 quadrants and visual fiels were intact. He became nauseous and vomited shortly after vision testing. Pt appears to have some vestibular issues.  Patient has difficulty with following activities due to following visual impairments: reading    OBSERVATIONS: Pleasant agreeable male, accompanied by his wife and 1 y.o son Pt's BP was elevated at end of session when he was vomiting 140/102, and 148/115, pt was instructed to monitor at home and to contact MD if diastolic BP does not reduce to below 100. pt's wife verbalized understanding                                                                                                                             TREATMENT DATE:02/05/24- constant therapy alternating symbols 88% accuracy, 42.62 secs response time, for alternating attention with visual component Ambulating while tossing ball and performing category generation, only 2-3 cues for entire alphabet. Copying small peg design with LUE for increased fine motor coordiantion with a visual and cognitive component, min difficulty/ v.c.  Removing with in hand manipulation, min-mod drops. UBE x 6 mins level 3 for conditioning and reciprocal movement   01/30/24- Constant therapy:Put steps in order(  for reading activity), 100% accuracy, no increased dizziness.  Followed by reading a map 80% acuracy, 35.52 response time, due to pt moving quickly and mistakenly hitting one itme and one error due to not double checking. Reading accuracy 80% accuracy. Grooved pegs for increased fine motor coordination, min difficulty/ drops, min v.c Environmental scanning to locate items 1-9 in squential order, pt with good success. Amb while tossing ball and naming animals for every letter of alphabet, min questionsing cues.  01/22/24- see pt. education Copying small peg design with LUE for fine motor coordination with a visual componant, min difficulty/ drops for coordination. Pt copied design correctly without v.c  Number cancellation 1M with 100% accuracy and no reports of dizziness nausea    01/15/24-coordiantion HEP issued, see education  01/07/24 eval        PATIENT EDUCATION: Education details: see above Person educated: Patient and Spouse Education method: Explanation, demonstration, v.c  Education comprehension: verbalized understanding, returned demonstration,   HOME EXERCISE PROGRAM: coordination, putty   GOALS: Goals reviewed with patient? Yes  SHORT TERM GOALS: Target date: 02/06/24  I with HEP  Goal status: met, 01/22/24- demonstrates understanding of putty exercises  2.  Pt will demonstrate improved fine motor coordination as evidenced by decreasing 9 hole peg test LUE to 29 secs or less without several drops.  Goal status: ongoing  3.  Pt will increase LUE grip strength to 52 lbs or greater for increased ease with daily activities.  Goal status: met, 63 lbs  4.  Pt will perfrom tabletop scanning without diplopia or vomiting with 90% or better accuracy.  Goal status: ongoing  5.  Pt will perfrom transitional movements for ADLS/ IADLs without LOB or nausea greater than 3/10.  Goal status: INITIAL  6. I with adapted strategies for ADLs/IADLs to improve safety and I.    Goal status: ongoing  LONG TERM GOALS: Target date: 03/31/24  I with updated HEP  Goal status: INITIAL  2.  Pt will perform mod complex home management mod I  Goal status: INITIAL  3.  Pt will perform environmental scanning in a busy environment with 90% or better accuracy, without vomiting  Goal status: ongoing  4.  Pt will perfrom simulated work activities mod I  Goal status: ongoing    ASSESSMENT:  CLINICAL IMPRESSION: Pt is progressing towards goals. He demonstrates improving RUE grip and coordination. Pt tolerated therapy today without getting sick.PERFORMANCE DEFICITS: in functional skills including ADLs, IADLs, coordination, dexterity, ROM, strength, Fine motor control, Gross motor control, balance, endurance, decreased knowledge of precautions, decreased knowledge of use of DME, vision, UE functional use, and vestibular, cognitive skills including safety awareness, and psychosocial skills including coping strategies, environmental adaptation, habits, interpersonal interactions, and routines and behaviors.   IMPAIRMENTS: are limiting patient from ADLs, IADLs, rest and sleep, work, play, leisure, and social participation.   CO-MORBIDITIES: may have co-morbidities  that affects occupational performance. Patient will benefit from skilled OT to address above impairments and improve overall function.  MODIFICATION OR ASSISTANCE TO COMPLETE EVALUATION: Min-Moderate modification of tasks or assist with assess necessary to complete an evaluation.  OT OCCUPATIONAL PROFILE AND HISTORY: Detailed assessment: Review of records and additional review of physical, cognitive, psychosocial history related to current functional performance.  CLINICAL DECISION MAKING: LOW - limited treatment options, no task modification necessary  REHAB POTENTIAL: Good  EVALUATION COMPLEXITY: Low    PLAN:  OT FREQUENCY: 2x/week plus eval  OT DURATION: 12 weeks  PLANNED INTERVENTIONS: 97168 OT  Re-evaluation, 97535 self care/ADL training, 96295 therapeutic exercise, 97530 therapeutic activity, 97112 neuromuscular re-education, 97140 manual therapy, 97116 gait training, 28413 aquatic therapy, 97035 ultrasound, 97018 paraffin, 24401 moist heat, 97010 cryotherapy, 97034 contrast bath, balance training, functional mobility training, visual/perceptual remediation/compensation, energy conservation, coping strategies training, patient/family education, and DME and/or AE instructions  RECOMMENDED OTHER SERVICES: PT  CONSULTED AND AGREED WITH PLAN OF CARE: Patient  PLAN FOR NEXT SESSION:  work activities  Nesreen Albano, OT 02/05/2024, 4:24 PM

## 2024-02-10 ENCOUNTER — Ambulatory Visit: Payer: 59 | Attending: Physical Medicine and Rehabilitation | Admitting: Occupational Therapy

## 2024-02-10 ENCOUNTER — Ambulatory Visit: Payer: 59

## 2024-02-10 DIAGNOSIS — R41842 Visuospatial deficit: Secondary | ICD-10-CM

## 2024-02-10 DIAGNOSIS — I619 Nontraumatic intracerebral hemorrhage, unspecified: Secondary | ICD-10-CM | POA: Diagnosis present

## 2024-02-10 DIAGNOSIS — R278 Other lack of coordination: Secondary | ICD-10-CM | POA: Diagnosis present

## 2024-02-10 DIAGNOSIS — M6281 Muscle weakness (generalized): Secondary | ICD-10-CM

## 2024-02-10 DIAGNOSIS — R262 Difficulty in walking, not elsewhere classified: Secondary | ICD-10-CM | POA: Insufficient documentation

## 2024-02-10 DIAGNOSIS — R2689 Other abnormalities of gait and mobility: Secondary | ICD-10-CM | POA: Insufficient documentation

## 2024-02-10 DIAGNOSIS — R42 Dizziness and giddiness: Secondary | ICD-10-CM | POA: Diagnosis present

## 2024-02-10 NOTE — Patient Instructions (Signed)
  Strengthening: Resisted Flexion   Hold tubing with arm(s) at side. Pull forward and up. Move shoulder through pain-free range of motion. Repeat __10__ times per set.  Do _1-2_ sessions per day , every other day   Strengthening: Resisted Extension   Hold tubing in hand(s), arm forward. Pull arm back, elbow straight. Repeat _10___ times per set. Do _1-__ sessions per day, every other day.   Resisted Horizontal Abduction: Bilateral   Sit or stand, tubing in both hands, arms out in front. Keeping arms straight, pinch shoulder blades together and stretch arms out. Repeat _10___ times per set. Do _1___ sessions per day, every other day.   Elbow Flexion: Resisted   With tubing held in  hand(s) and other end secured under foot, curl arm up as far as possible. Repeat _10___ times per set. Do _1__ sessions per day, every other day.    Elbow Extension: Resisted  Triceps Extension (Frontal)    One arm forward at chest height and bent to 90, end of band in hand, other end secured on same side shoulder by other hand, extend arm. Hold _5__ seconds. Repeat _10__ times, alternating arms. Do 1 sessions per day.  Copyright  VHI. All rights reserved.    Copyright  VHI. All rights reserved.

## 2024-02-10 NOTE — Therapy (Signed)
 OUTPATIENT OCCUPATIONAL THERAPY NEURO Treatment  Patient Name: Wayne Lee MRN: [id number] DOB:December 13, 1975, 48 y.o., male Today's Date: 02/10/2024  PCP: none REFERRING PROVIDER: Dr. Shearon Stalls  END OF SESSION:  OT End of Session - 02/10/24 1637     Visit Number 6    Number of Visits 25    Date for OT Re-Evaluation 03/31/24    Authorization Type aetna    Authorization Time Period 12 weeks    OT Start Time 1535    OT Stop Time 1615    OT Time Calculation (min) 40 min    Activity Tolerance Patient tolerated treatment well    Behavior During Therapy WFL for tasks assessed/performed   pt vomited end of session                 Past Medical History:  Diagnosis Date   Atopic dermatitis in adult    Left cataract    Obesity (BMI 30-39.9)    Stroke [facility])    Undescended left testicle    had surgical intervention   Past Surgical History:  Procedure Laterality Date   CATARACT EXTRACTION W/ INTRAOCULAR LENS IMPLANT Left 2017   CHOLECYSTECTOMY     ORCHIOPEXY  1984   SUBOCCIPITAL CRANIECTOMY CERVICAL LAMINECTOMY N/A 12/16/2023   Procedure: SUBOCCIPITAL CRANIECTOMY;  Surgeon: Barnett Abu, MD;  Location: [facility] OR;  Service: Neurosurgery;  Laterality: N/A;   Patient Active Problem List   Diagnosis Date Noted   Nausea and vomiting 01/13/2024   Central nervous system origin vertigo, unspecified laterality 01/13/2024   Impaired mobility and ADLs 01/13/2024   Bilateral headaches 12/31/2023   ICH (intracerebral hemorrhage) (HCC) 12/20/2023   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   Hypertension 12/19/2023   Brain compression (HCC) 12/19/2023   Nontraumatic acute cerebral hemorrhage (HCC) 12/16/2023   Cerebellar hemorrhage (HCC) 12/16/2023    ONSET DATE: 12/16/23  REFERRING DIAG: I61.9 (ICD-10-CM) - Nontraumatic acute cerebral hemorrhage (HCC)   THERAPY DIAG:  Muscle weakness (generalized)  Other lack of coordination  Other abnormalities of gait and  mobility  Visuospatial deficit  Rationale for Evaluation and Treatment: Rehabilitation  SUBJECTIVE:   SUBJECTIVE STATEMENT: Pt reports doesn't have any pain today Pt accompanied by: significant other  PERTINENT HISTORY: 48 yo male arrival 1/6 with HTN 204/106 taken to emergent OR hematoma evacuation s/p suboccipital craniotomy and hypertonic saline started. Extubated 1/7 PMH obesity, sleep apnea, HLD d/c home 1/21/5 Per pt's wife AVM  CT Head without contrast: Large acute 5.3 cm intraparenchymal hemorrhage in the left cerebellum. Mass effect with narrowed fourth ventricle, but no hydrocephalus at this time. Basal cisterns are effaced and there is early ascending transtentorial herniation. PRECAUTIONS: Fall  WEIGHT BEARING RESTRICTIONS: No  PAIN: no pain today FALLS: Has patient fallen in last 6 months? No  LIVING ENVIRONMENT: Lives with: lives with their family and lives with their spouse Lives in: House/apartment Stairs: yes  Has following equipment at home: None  PLOF: Independent  PATIENT GOALS: return to prior level  OBJECTIVE:  Note: Objective measures were completed at Evaluation unless otherwise noted.  HAND DOMINANCE: Right  ADLs: Overall ADLs: mod I-supervision with basic ADLS Transfers/ambulation related to ADLs: Eating: mod I Grooming: mod I UB Dressing: mod I LB Dressing: mod I Toileting: mod I Bathing: supervision, sits in bottom of bathtub Tub Shower transfers: supervision   IADLs:dependent for IADLS Medication management: wife is Teacher, English as a foreign language: dependent   MOBILITY STATUS:  mod I-supervision    ACTIVITY TOLERANCE: Activity tolerance: limited  by nausea   UPPER EXTREMITY ROM:  WFLS   UPPER EXTREMITY MMT:     MMT Right eval Left eval  Shoulder flexion 4+/5 4/5  Shoulder abduction    Shoulder adduction    Shoulder extension    Shoulder internal rotation    Shoulder external rotation    Middle trapezius     Lower trapezius    Elbow flexion 4+/5 4+/5  Elbow extension 4+/5 4/5  Wrist flexion    Wrist extension    Wrist ulnar deviation    Wrist radial deviation    Wrist pronation    Wrist supination    (Blank rows = not tested)  HAND FUNCTION: Grip strength: Right: 63 lbs; Left: 48 lbs  COORDINATION: 9 Hole Peg test: Right: 26.25 sec; Left: 32.80 with several drops sec  SENSATION: WFL  COGNITION: Overall cognitive status:  recalls 3/3 words following short delay, spells WORLD backwards without difficulty  VISION: Subjective report: Pt reports double vision at times   VISION ASSESSMENT: To be further assessed in functional context Pt reports diplopia at times He was able to track to all 4 quadrants and visual fiels were intact. He became nauseous and vomited shortly after vision testing. Pt appears to have some vestibular issues.  Patient has difficulty with following activities due to following visual impairments: reading    OBSERVATIONS: Pleasant agreeable male, accompanied by his wife and 1 y.o son Pt's BP was elevated at end of session when he was vomiting 140/102, and 148/115, pt was instructed to monitor at home and to contact MD if diastolic BP does not reduce to below 100. pt's wife verbalized understanding                                                                                                                             TREATMENT DATE:Reading activity to read larger print items on the I pad then smaller article on the computer. Pt reports it is not blurry but he has some visual fatigue. Pt was instructed in theraband exercises: green band for shoulder abduction, then blue for remaining exercises- see pt instructions, 15 reps each, min v.c for positioning. Pt was isntructed that he can try blue band for shoulder abduction, however he should stop if he ahs any increased pain.  02/05/24- constant therapy alternating symbols 88% accuracy, 42.62 secs response time,  for alternating attention with visual component Ambulating while tossing ball and performing category generation, only 2-3 cues for entire alphabet. Copying small peg design with LUE for increased fine motor coordiantion with a visual and cognitive component, min difficulty/ v.c.  Removing with in hand manipulation, min-mod drops. UBE x 6 mins level 3 for conditioning and reciprocal movement   01/30/24- Constant therapy:Put steps in order( for reading activity), 100% accuracy, no increased dizziness.  Followed by reading a map 80% acuracy, 35.52 response time, due to pt moving quickly and mistakenly hitting one itme and one error due to not  double checking. Reading accuracy 80% accuracy. Grooved pegs for increased fine motor coordination, min difficulty/ drops, min v.c Environmental scanning to locate items 1-9 in squential order, pt with good success. Amb while tossing ball and naming animals for every letter of alphabet, min questionsing cues.  01/22/24- see pt. education Copying small peg design with LUE for fine motor coordination with a visual componant, min difficulty/ drops for coordination. Pt copied design correctly without v.c Number cancellation 57M with 100% accuracy and no reports of dizziness nausea    01/15/24-coordiantion HEP issued, see education  01/07/24 eval        PATIENT EDUCATION: Education details:blue and green theraband exercises, recommendation that pt gradually increases activity level at home and he begins practicing reading on computer for return to work. Person educated: Patient and Spouse Education method: Explanation, demonstration, v.c  Education comprehension: verbalized understanding, returned demonstration,   HOME EXERCISE PROGRAM: coordination, putty   GOALS: Goals reviewed with patient? Yes  SHORT TERM GOALS: Target date: 02/06/24  I with HEP  Goal status: met, 01/22/24- demonstrates understanding of putty exercises  2.  Pt will  demonstrate improved fine motor coordination as evidenced by decreasing 9 hole peg test LUE to 29 secs or less without several drops.  Goal status:met, 27.07 secs  3.  Pt will increase LUE grip strength to 52 lbs or greater for increased ease with daily activities.  Goal status: met, 63 lbs  4.  Pt will perfrom tabletop scanning without diplopia or vomiting with 90% or better accuracy.  Goal status: ongoing  5.  Pt will perfrom transitional movements for ADLS/ IADLs without LOB or nausea greater than 3/10.  Goal status: INITIAL  6. I with adapted strategies for ADLs/IADLs to improve safety and I.   Goal status: ongoing  LONG TERM GOALS: Target date: 03/31/24  I with updated HEP  Goal status: INITIAL  2.  Pt will perform mod complex home management mod I  Goal status: INITIAL  3.  Pt will perform environmental scanning in a busy environment with 90% or better accuracy, without vomiting  Goal status: ongoing  4.  Pt will perfrom simulated work activities mod I  Goal status: ongoing    ASSESSMENT:  CLINICAL IMPRESSION: Pt is progressing towards goals. He demonstrates improving RUE  coordiantion. Pt demonstrates understanding of theraband HEP.Marland KitchenPERFORMANCE DEFICITS: in functional skills including ADLs, IADLs, coordination, dexterity, ROM, strength, Fine motor control, Gross motor control, balance, endurance, decreased knowledge of precautions, decreased knowledge of use of DME, vision, UE functional use, and vestibular, cognitive skills including safety awareness, and psychosocial skills including coping strategies, environmental adaptation, habits, interpersonal interactions, and routines and behaviors.   IMPAIRMENTS: are limiting patient from ADLs, IADLs, rest and sleep, work, play, leisure, and social participation.   CO-MORBIDITIES: may have co-morbidities  that affects occupational performance. Patient will benefit from skilled OT to address above impairments and improve  overall function.  MODIFICATION OR ASSISTANCE TO COMPLETE EVALUATION: Min-Moderate modification of tasks or assist with assess necessary to complete an evaluation.  OT OCCUPATIONAL PROFILE AND HISTORY: Detailed assessment: Review of records and additional review of physical, cognitive, psychosocial history related to current functional performance.  CLINICAL DECISION MAKING: LOW - limited treatment options, no task modification necessary  REHAB POTENTIAL: Good  EVALUATION COMPLEXITY: Low    PLAN:  OT FREQUENCY: 2x/week plus eval  OT DURATION: 12 weeks  PLANNED INTERVENTIONS: 97168 OT Re-evaluation, 97535 self care/ADL training, 16109 therapeutic exercise, 97530 therapeutic activity, 97112 neuromuscular re-education, 97140  manual therapy, [id number] gait training, 16109 aquatic therapy, 97035 ultrasound, 97018 paraffin, 60454 moist heat, 97010 cryotherapy, 97034 contrast bath, balance training, functional mobility training, visual/perceptual remediation/compensation, energy conservation, coping strategies training, patient/family education, and DME and/or AE instructions  RECOMMENDED OTHER SERVICES: PT  CONSULTED AND AGREED WITH PLAN OF CARE: Patient  PLAN FOR NEXT SESSION:   reading tolerance  Philomene Haff, OT 02/10/2024, 4:38 PM

## 2024-02-10 NOTE — Therapy (Signed)
 OUTPATIENT PHYSICAL THERAPY NEURO TREATMENT Progress Note Reporting Period 01/03/24 to 02/10/24  See note below for Objective Data and Assessment of Progress/Goals.      Patient Name: Wayne Lee MRN: [id number] DOB:19-Apr-1976, 48 y.o., male Today's Date: 02/10/2024   PCP: No PCP REFERRING PROVIDER: Delle Reining, PA-C  END OF SESSION:            Past Medical History:  Diagnosis Date   Atopic dermatitis in adult    Left cataract    Obesity (BMI 30-39.9)    Stroke [facility])    Undescended left testicle    had surgical intervention   Past Surgical History:  Procedure Laterality Date   CATARACT EXTRACTION W/ INTRAOCULAR LENS IMPLANT Left 2017   CHOLECYSTECTOMY     ORCHIOPEXY  1984   SUBOCCIPITAL CRANIECTOMY CERVICAL LAMINECTOMY N/A 12/16/2023   Procedure: SUBOCCIPITAL CRANIECTOMY;  Surgeon: Barnett Abu, MD;  Location: [facility] OR;  Service: Neurosurgery;  Laterality: N/A;   Patient Active Problem List   Diagnosis Date Noted   Nausea and vomiting 01/13/2024   Central nervous system origin vertigo, unspecified laterality 01/13/2024   Impaired mobility and ADLs 01/13/2024   Bilateral headaches 12/31/2023   ICH (intracerebral hemorrhage) (HCC) 12/20/2023   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  Rationale for Evaluation and Treatment: Rehabilitation  SUBJECTIVE:                                                                                                                                                                                             SUBJECTIVE STATEMENT: I am alright. Threw up before I came here.   Pt accompanied by:  self  PERTINENT HISTORY: 1/6 stroke, ICU and IRF leaving hospital 1/21  PAIN:  Are you having pain? No 0/10  PRECAUTIONS: None  RED FLAGS: None   WEIGHT BEARING RESTRICTIONS: No  FALLS: Has patient fallen in last 6 months? No  LIVING ENVIRONMENT: Lives with: lives with their family and lives with their spouse Lives in: House/apartment Stairs: Yes: Internal: 15 steps; can reach both and External: 2 steps; none front door 4 steps rails on both sides Has following equipment at home: None  PLOF:  supervision for I/ADLs  PATIENT GOALS: walking, energy, endurance  OBJECTIVE:  Note: Objective measures were completed at Evaluation unless otherwise noted.  DIAGNOSTIC FINDINGS:  Nothing new  COGNITION: Overall cognitive status: Within functional limits for tasks assessed   SENSATION: WFL  COORDINATION: Toe taps and heel-to-shin normal  EDEMA:  None  MUSCLE TONE: RLE: Within functional limits  MUSCLE LENGTH: NT  POSTURE:  very wide BOS   LOWER EXTREMITY ROM:   WFL   Active  Right Eval Left Eval  Hip flexion    Hip extension    Hip abduction    Hip adduction    Hip internal rotation    Hip external rotation    Knee flexion    Knee extension    Ankle dorsiflexion    Ankle plantarflexion    Ankle inversion    Ankle eversion     (Blank rows = not tested)  LOWER EXTREMITY MMT:    MMT Right Eval Left Eval  Hip flexion 5/5 5/5  Hip extension    Hip abduction 5/5 5/5  Hip adduction 5/5 5/5  Hip internal rotation    Hip external rotation    Knee flexion 5/5 5/5  Knee extension 5/5 5/5  Ankle dorsiflexion    Ankle plantarflexion    Ankle inversion    Ankle eversion    (Blank rows = not tested)  BED MOBILITY:  Sit to supine Complete Independence and Modified independence Supine to sit Modified independence Rolling to Right Complete Independence Rolling to Left Complete Independence  TRANSFERS: Assistive device utilized: None  Sit to stand:  Modified independence Stand to sit: Complete Independence Chair to chair: Modified independence Floor:  NT   STAIRS: Level of Assistance:  NT Stair Negotiation Technique:  with  Number of Stairs:   Height of Stairs:   Comments: NT  GAIT: Gait pattern: wide BOS, R lateral lean using wall to balance on the way back to room Distance walked: 50 ft Assistive device utilized: None Level of assistance: Min A with HHA from wife (pt did not want to, but was leaning) Comments: could potentially use AD  FUNCTIONAL TESTS:  5 times sit to stand: 9.26 seconds Berg Balance Scale: 48/56  PATIENT SURVEYS:  NT today                                                                                                                              TREATMENT DATE:  02/10/24 Recheck goals  Elliptical L1x2 mins  NuStep L5 x71mins  Resisted side steps over obstacles 30# x5 each side  On airex catch and throw at the same time with tennis balls- difficulty catching with L hand  Step up 6" to SLS x5 each side Leg press 20# 2x10  02/04/24 Redo BERG 54/56 Walking on grass  Walking big lap around back building NuStep L5 x82mins Treadmill pushes 30s x2 Walking on beam  Standing on 2 dyna disc  Step ups on BOSU x5 each side Side steps on BOSU   02/03/24 Bike L3 x55mins  Resisted gait 30# 3 way x3  Step ups 6"  Walking with ball toss and direction changes  Step ups on airex- minA  Side steps on airex- minA  Tandem walking  On airex shoulder ext 2x10  STS on airex with ball toss 2x10  01/28/24 NuStep L5x76mins Walking with eyes focused on target forwards and backwards Head turns and nods while looking at a target and walking Lateral steps on BOSU in bars- unsteady and needs UE support  Balance on BOSU Mini squat on BOSU- min-modA  On BOSU ball toss - modA    01/27/24 Standing VOR horizontal 2x60 seconds Standing VOR vertical 2x60 seconds- still more nauseous with cervical  flexion/extension Standing marches on blue foam pad cues to stay in middle of pad x20  Standing on blue foam pad + cross midline reaching to target progressive difficulty (cues to find target with eyes/head before reaching for it) Standing on blue foam pad + cross midline toe taps to targets (cues to find target with eyes/head before tapping) SLS with one foot on soft surface of BOSU/one foot on floor 3x30 seconds B  Alternating toe taps to BOSU from solid surface x20    01/21/24 NuStep L1 x5mins  Seated VOR x1 horizontal and vertical x15  Standing head turns horizontal and vertical x15-- dizzy and a little nauseous when looking up  Walking with head turns 5 steps down and back x2  Standing marches  Standing on airex reaching for numbers  Standing on airex ball toss then volleyball hits   01/20/24 Tandem gait on blue foam pad x3 laps  Vor horizontal 2x60 seconds  Side steps on foam x3 laps  Saccades horizontal and vertical STS with horizontal VOR x3  Vomited after side stepping on foam- provided warm washcloth and mentos   Seated UT stretch 2x30 seconds B Levator stretch 2x30 seconds B  Chin tucks x10 Mod cues     01/16/24  Tandem stance blue foam pad 3x30 seconds B intermittent BUE support on bars, min guard  Standing on blue foam pad with forward taps to targets x10 B, Min guard-MinA  Standing on blue foam pad with cross midline forward toe taps x10 B, min guard-MinA  Lateral shifts on rocker board x2 minutes, eyes on target to manage motion sensitivity, min guard  SLS on blue foam pad with other foot on soft surface of BOSU 3x30 seconds   Became nauseous and actively vomiting mid session, did well with warm wash clothes and cookie      01/14/24 Seated march 20 reps alt  LAQ x10 each side  VOR x1 horizontal and vertical seated VOR x2 horizontal and vertical seated Feet together, EC feet together  Romberg stance, tandem standing 30s  SLS 10s  Standing on airex feet  together Standing on airex head turns  On airex playing catch   01/03/24    PATIENT EDUCATION: Education details: Diagnosis, Prognosis, POC, holding off on HEP Person educated: Patient and Spouse Education method: Explanation, Demonstration, Tactile cues, and Verbal cues Education comprehension: verbalized understanding, returned demonstration, verbal cues required, tactile cues required, and needs further education  HOME EXERCISE PROGRAM:  Access Code: [email or web address] + up and walking 5-10 minutes every hour awake  URL: [email or web address] Date: 01/20/2024 Prepared by: Nedra Hai  Exercises - Tandem Stance in Corner  - 1-2 x daily - 7 x weekly - 1 sets - 3 reps - 30 seconds  hold - Seated Gaze Stabilization with Head Rotation  - 1-2 x daily - 7 x weekly - 1 sets - 2 reps -  60 seconds  hold   GOALS: Goals reviewed with patient? No  SHORT TERM GOALS: Target date: 01/25/24  Initial HEP will be prescribed by 3rd visit Baseline: not provided  Goal status: MET 01/20/24  2.  Headache pain will not increase above 7/10 during session.  Baseline: started at 7/10, pt verbalized it was worse at end of eval  Goal status: ongoing 01/21/24, MET 01/28/24  3.  Activity increase will not elicit vomiting. Baseline: vomited at end of eval after BERG testing. Goal status: ongoing 01/21/24, doing harder tasks in sessions no vomiting in 3 sessions 01/28/24, progressing 02/10/24   LONG TERM GOALS: Target date: 03/09/24  Pt will be independent with advanced HEP, as prescribed throughout POC. Baseline: not prescribed yet Goal status: INITIAL  2.  Pt will decrease HA pain to </= 4/10 with activity.  Baseline: > 7/10 Goal status: progressing 02/10/24  3.  Pt will increase BERG balance test score to >/= 52/56. Baseline: 48/56 Goal status: MET 54/56 02/04/24  4.  Pt will complete full session without vomiting or any other adverse reactions.  Baseline: vomited after eval Goal status:  progressing 2 sessions back to back w/vomit, 2 sessions without, MET 02/10/24  5. Pt will complete SLS and tandem on firm and foam without LOB >10s   Baseline: 5s  Goal status: INITIAL     ASSESSMENT:  CLINICAL IMPRESSION: Pt arrives today doing OK, he reports that he is slowly feeling better every day and progressing towards his goals. He had some difficulty with coordination on airex pad playing catch while simultaneously throwing ball. Resisted gait was harder when having to control balance with LLE. Also some difficulty and LOB with step ups to single leg on 6" step.     OBJECTIVE IMPAIRMENTS: decreased activity tolerance, decreased balance, difficulty walking, decreased safety awareness, impaired perceived functional ability, increased muscle spasms, and pain.   ACTIVITY LIMITATIONS: standing, stairs, and locomotion level  PARTICIPATION LIMITATIONS: cleaning, laundry, driving, shopping, community activity, and occupation  PERSONAL FACTORS: 1 comorbidity: history of stroke  are also affecting patient's functional outcome.   REHAB POTENTIAL: Good  CLINICAL DECISION MAKING: Evolving/moderate complexity  EVALUATION COMPLEXITY: Moderate  PLAN:  PT FREQUENCY: 1-2x/week  PT DURATION: 8 weeks  PLANNED INTERVENTIONS: 97164- PT Re-evaluation, 97110-Therapeutic exercises, 97530- Therapeutic activity, [id number]- Neuromuscular re-education, 97535- Self Care, 16109- Manual therapy, and [id number]- Gait training  PLAN FOR NEXT SESSION:   Cassie Freer, PT, DPT 02/10/24 8:53 AM

## 2024-02-11 NOTE — Progress Notes (Signed)
 Subjective:    Patient ID: Wayne Lee, male    DOB: 15-May-1976, 48 y.o.   MRN: [id number]  HPI  Wayne Lee is a 48 y.o. year old male  who  has a past medical history of Atopic dermatitis in adult, Left cataract, Obesity (BMI 30-39.9), Stroke (HCC), and Undescended left testicle.   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   Plan from last visit:  -Increase a.m. dose of meclizine to 50 mg, continue other to as needed doses daily at 25 mg. -Prescribed Valium 2 mg number 10 tablets for nausea/vomiting/vertigo not responsive to meclizine for when symptoms are severe -Discussed planning breaks from sensory overstimulation during chair tournament this weekend, including allowing the patient to step away to a quiet area between his daughters events. -Continue eating and drinking well, and working with physical therapy.  Since symptoms have spontaneously improved since last visit, do not feel urgent repeat imaging is necessitated at this time.   Interval Hx:  - Therapies:  continuing, per 3/3 note: Pt arrives today doing OK, he reports that he is slowly feeling better every day and progressing towards his goals. He had some difficulty with coordination on airex pad playing catch while simultaneously throwing ball. Resisted gait was harder when having to control balance with LLE. Also some difficulty and LOB with step ups to single leg on 6" step.   Patient feels like he is doing very well; this weke he was able to get through an activity he wasn't able to    - Follow ups: Neurosurgery in 6 months, with another scan at that time.    - Falls: none   - DME: none   - Medications: Wife feels like the scopalamine patches help a lot and the day of the change, it "kicks up" a bit.    Using meclizine every day and the breakthrough "rarely", is using   Has not needed valium more than a few times because it made him very sleepy, compazine has not needed at all.  He is taking Tylenol 650 mg once daily for headaches and pain at incision site, which is working well.    - Other concerns:  "more nausea, still vomitting one or two days per week". Is asking about return to work and return to driving today; is working on using a laptop now intermittently.    Pain Inventory Average Pain 0 Pain Right Now 0 My pain is  no pain  In the last 24 hours, has pain interfered with the following? General activity 0 Relation with others 0 Enjoyment of life 0 What TIME of day is your pain at its worst? No pain Sleep (in general) Good  Pain is worse with:  no pain Pain improves with:  no pain Relief from Meds:  n/a  Family History  Problem Relation Age of Onset   Heart Problems Mother    Cancer Father    Cancer Maternal Grandfather    Social History   Socioeconomic History   Marital status: Married    Spouse name: Not on file   Number of children: Not on file   Years of education: Not on file   Highest education level: Not on file  Occupational History   Not on file  Tobacco Use   Smoking status: Never  Smokeless tobacco: Never  Substance and Sexual Activity   Alcohol use: Never   Drug use: Never   Sexual activity: Not on file  Other Topics Concern   Not on file  Social History Narrative   Not on file   Social Drivers of Health   Financial Resource Strain: Low Risk  (01/01/2024)   Received from [facility]   Overall Financial Resource Strain (CARDIA)    Difficulty of Paying Living Expenses: Not hard at all  Food Insecurity: No Food Insecurity (01/01/2024)   Received from [facility]   Hunger Vital Sign    Worried About Running Out of Food in the Last Year: Never true    Ran Out of Food in the Last Year: Never true  Transportation Needs: No  Transportation Needs (01/01/2024)   Received from [facility] - Transportation    Lack of Transportation (Medical): No    Lack of Transportation (Non-Medical): No  Physical Activity: Unknown (09/02/2023)   Received from [facility]   Exercise Vital Sign    Days of Exercise per Week: 0 days    Minutes of Exercise per Session: Not on file  Stress: No Stress Concern Present (09/02/2023)   Received from [facility] Occup[facility] - Occupational Stress Questionnaire    Feeling of Stress : Not at all  Social Connections: Socially Integrated (09/02/2023)   Received from [facility]   Social Network    How would you rate your social network (family, work, friends)?: Good participation with social networks   Past Surgical History:  Procedure Laterality Date   CATARACT EXTRACTION W/ INTRAOCULAR LENS IMPLANT Left 2017   CHOLECYSTECTOMY     ORCHIOPEXY  1984   SUBOCCIPITAL CRANIECTOMY CERVICAL LAMINECTOMY N/A 12/16/2023   Procedure: SUBOCCIPITAL CRANIECTOMY;  Surgeon: Barnett Abu, MD;  Location: [facility] OR;  Service: Neurosurgery;  Laterality: N/A;   Past Surgical History:  Procedure Laterality Date   CATARACT EXTRACTION W/ INTRAOCULAR LENS IMPLANT Left 2017   CHOLECYSTECTOMY     ORCHIOPEXY  1984   SUBOCCIPITAL CRANIECTOMY CERVICAL LAMINECTOMY N/A 12/16/2023   Procedure: SUBOCCIPITAL CRANIECTOMY;  Surgeon: Barnett Abu, MD;  Location: [facility] OR;  Service: Neurosurgery;  Laterality: N/A;   Past Medical History:  Diagnosis Date   Atopic dermatitis in adult    Left cataract    Obesity (BMI 30-39.9)    Stroke [facility])    Undescended left testicle    had surgical intervention   There were no vitals taken for this visit.  Opioid Risk Score:   Fall Risk Score:  `1  Depression screen [facility] 2/9     01/13/2024    8:42 AM  Depression screen PHQ 2/9  Decreased Interest 0  Down, Depressed, Hopeless 0  PHQ - 2 Score 0  Altered sleeping 2  Tired, decreased  energy 2  Change in appetite 2  Feeling bad or failure about yourself  0  Trouble concentrating 1  Moving slowly or fidgety/restless 0  Suicidal thoughts 0  PHQ-9 Score 7  Difficult doing work/chores Very difficult     Review of Systems  All other systems reviewed and are negative.      Objective:   Physical Exam    Constitution: Appropriate appearance for age. No apparent distress. Resp: No respiratory distress. No accessory muscle usage. on RA and CTAB Cardio: Well perfused appearance No peripheral edema. Abdomen: Nondistended. Nontender.   Psych: improving affect. Still a little lethargic/flat.  Neuro: AAOx4. No  apparent cognitive deficits.    Neurologic Exam:   Eyes:  NO nystagmus but vertiginous symptoms with superior gaze only. PERRLA.  DTRs: Reflexes were 2+  Babinsky: flexor responses b/l.   Hoffmans: negative b/l Sensory exam: revealed normal sensation in all dermatomal regions in bilateral upper extremities and bilateral lower extremities Motor exam: strength 5/5 throughout bilateral upper extremities and bilateral lower extremities Coordination: Fine motor coordination was normal.   Gait: normal     Assessment & Plan:  Wayne Lee is a 48 y.o. year old male  who  has a past medical history of Atopic dermatitis in adult, Left cataract, Obesity (BMI 30-39.9), Stroke (HCC), and Undescended left testicle.   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   Nontraumatic acute cerebral hemorrhage (HCC)  I will fill out return to work forms and fax them ASAP - indicating 8 hours/day with 30 minute rest break every 2 hours, and 2 hours/ 2x per week for therapies, and possible 2 hours off 2x weekly for exacerbations up to 05/13/24; cannot lift 25 lbs overhead due to  balance issues but should not interfere with [facility]. Did return foster care forms today  Follow up in 3 months  Central nervous system origin vertigo, unspecified laterality Nausea and vomiting, unspecified vomiting type Continue your scopalamine patch  Continue as needed meclizine; you can take up to 50 mg in a single dose, but do not exceed 100 mg daily   Wait a month and, if you are feeling good on car trips without nausea or motion sickness, look into OT driving evaluations before returning to driving; form provided today  Bilateral headaches  Minimal; well controlled with Tylenol now  Impaired mobility and ADLs Chronic fatigue  Was somewhat pre-existing IPH; has undergone workup with PCP  Pick the same time to lay down every night, ideally between 8 and 10 PM.    Starting 1 hour before you want to go to sleep, turn off all television screens, phone screens, tablets, and computers.    Keep the lights low and perform only low stimulation activities, such as reading.    Only use your bedroom for sleep and sex. Avoid daytime naps and limit any time spent in your bed that is not dedicated to sleep. --patient currently taking 2 hour naps daily  You may also take 3 to 5 mg of over-the-counter melatonin approximately 1 hour before bedtime, or if you are prescribed a medication for sleep take it at this time.  Avoid alcohol, decongestants/pseudoephedrine, antihistamines, caffeine, and nicotine a few hours before bedtime, as these can reduce your quality of sleep.

## 2024-02-12 ENCOUNTER — Encounter: Payer: 59 | Attending: Physical Medicine and Rehabilitation | Admitting: Physical Medicine and Rehabilitation

## 2024-02-12 ENCOUNTER — Encounter: Payer: Self-pay | Admitting: Physical Medicine and Rehabilitation

## 2024-02-12 VITALS — BP 119/81 | HR 72 | Ht 68.0 in | Wt 229.2 lb

## 2024-02-12 DIAGNOSIS — R5382 Chronic fatigue, unspecified: Secondary | ICD-10-CM | POA: Insufficient documentation

## 2024-02-12 DIAGNOSIS — Z789 Other specified health status: Secondary | ICD-10-CM | POA: Diagnosis present

## 2024-02-12 DIAGNOSIS — R112 Nausea with vomiting, unspecified: Secondary | ICD-10-CM | POA: Diagnosis present

## 2024-02-12 DIAGNOSIS — H814 Vertigo of central origin: Secondary | ICD-10-CM | POA: Insufficient documentation

## 2024-02-12 DIAGNOSIS — R519 Headache, unspecified: Secondary | ICD-10-CM | POA: Insufficient documentation

## 2024-02-12 DIAGNOSIS — Z7409 Other reduced mobility: Secondary | ICD-10-CM | POA: Insufficient documentation

## 2024-02-12 DIAGNOSIS — I619 Nontraumatic intracerebral hemorrhage, unspecified: Secondary | ICD-10-CM | POA: Diagnosis not present

## 2024-02-12 NOTE — Therapy (Signed)
 OUTPATIENT PHYSICAL THERAPY NEURO TREATMENT Progress Note Reporting Period 01/03/24 to 02/10/24  See note below for Objective Data and Assessment of Progress/Goals.      Patient Name: [place] Lee MRN: [id number] DOB:[birth year], 48 y.o., male Today's Date: 02/12/2024   PCP: No PCP REFERRING PROVIDER: Delle Reining, PA-C  END OF SESSION:            Past Medical History:  Diagnosis Date   Atopic dermatitis in adult    Left cataract    Obesity (BMI 30-39.9)    Stroke [facility])    Undescended left testicle    had surgical intervention   Past Surgical History:  Procedure Laterality Date   CATARACT EXTRACTION W/ INTRAOCULAR LENS IMPLANT Left 2017   CHOLECYSTECTOMY     ORCHIOPEXY  1984   SUBOCCIPITAL CRANIECTOMY CERVICAL LAMINECTOMY N/A 12/16/2023   Procedure: SUBOCCIPITAL CRANIECTOMY;  Surgeon: Barnett Abu, MD;  Location: [facility] OR;  Service: Neurosurgery;  Laterality: N/A;   Patient Active Problem List   Diagnosis Date Noted   Nausea and vomiting 01/13/2024   Central nervous system origin vertigo, unspecified laterality 01/13/2024   Impaired mobility and ADLs 01/13/2024   Bilateral headaches 12/31/2023   ICH (intracerebral hemorrhage) (HCC) 12/20/2023   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  Rationale for Evaluation and Treatment: Rehabilitation  SUBJECTIVE:                                                                                                                                                                                             SUBJECTIVE STATEMENT: I am alright. Threw up before I came here.   Pt accompanied by:  self  PERTINENT HISTORY: 1/6 stroke, ICU and IRF leaving hospital 1/21  PAIN:  Are you having pain? No 0/10  PRECAUTIONS: None  RED FLAGS: None   WEIGHT BEARING RESTRICTIONS: No  FALLS: Has patient fallen in last 6 months? No  LIVING ENVIRONMENT: Lives with: lives with their family and lives with their spouse Lives in: House/apartment Stairs: Yes: Internal: 15 steps; can reach both and External: 2 steps; none front door 4 steps rails on both sides Has following equipment at home: None  PLOF:  supervision for I/ADLs  PATIENT GOALS: walking, energy, endurance  OBJECTIVE:  Note: Objective measures were completed at Evaluation unless otherwise noted.  DIAGNOSTIC FINDINGS:  Nothing new  COGNITION: Overall cognitive status: Within functional limits for tasks assessed   SENSATION: WFL  COORDINATION: Toe taps and heel-to-shin normal  EDEMA:  None  MUSCLE TONE: RLE: Within functional limits  MUSCLE LENGTH: NT  POSTURE:  very wide BOS   LOWER EXTREMITY ROM:   WFL   Active  Right Eval Left Eval  Hip flexion    Hip extension    Hip abduction    Hip adduction    Hip internal rotation    Hip external rotation    Knee flexion    Knee extension    Ankle dorsiflexion    Ankle plantarflexion    Ankle inversion    Ankle eversion     (Blank rows = not tested)  LOWER EXTREMITY MMT:    MMT Right Eval Left Eval  Hip flexion 5/5 5/5  Hip extension    Hip abduction 5/5 5/5  Hip adduction 5/5 5/5  Hip internal rotation    Hip external rotation    Knee flexion 5/5 5/5  Knee extension 5/5 5/5  Ankle dorsiflexion    Ankle plantarflexion    Ankle inversion    Ankle eversion    (Blank rows = not tested)  BED MOBILITY:  Sit to supine Complete Independence and Modified independence Supine to sit Modified independence Rolling to Right Complete Independence Rolling to Left Complete Independence  TRANSFERS: Assistive device utilized: None  Sit to stand:  Modified independence Stand to sit: Complete Independence Chair to chair: Modified independence Floor:  NT   STAIRS: Level of Assistance:  NT Stair Negotiation Technique:  with  Number of Stairs:   Height of Stairs:   Comments: NT  GAIT: Gait pattern: wide BOS, R lateral lean using wall to balance on the way back to room Distance walked: 50 ft Assistive device utilized: None Level of assistance: Min A with HHA from wife (pt did not want to, but was leaning) Comments: could potentially use AD  FUNCTIONAL TESTS:  5 times sit to stand: 9.26 seconds Berg Balance Scale: 48/56  PATIENT SURVEYS:  NT today                                                                                                                              TREATMENT DATE:  02/13/24 Treadmill walk Seated row Lat pull down  Obstacle course over obstacles, around cones, on airex, on 6" step  Catch on BOSU  02/10/24 Recheck goals  Elliptical L1x2 mins  NuStep L5 x45mins  Resisted side steps over obstacles 30# x5 each side  On airex catch and throw at the same time with tennis balls- difficulty catching with L hand  Step up 6" to SLS x5 each side Leg press 20# 2x10  02/04/24 Redo BERG 54/56 Walking on grass  Walking big lap around back building NuStep L5 x7mins Treadmill pushes 30s x2 Walking on beam  Standing on 2 dyna disc  Step ups on  BOSU x5 each side Side steps on BOSU   02/03/24 Bike L3 x24mins  Resisted gait 30# 3 way x3  Step ups 6"  Walking with ball toss and direction changes  Step ups on airex- minA  Side steps on airex- minA  Tandem walking  On airex shoulder ext 2x10  STS on airex with ball toss 2x10  01/28/24 NuStep L5x48mins Walking with eyes focused on target forwards and backwards Head turns and nods while looking at a target and walking Lateral steps on BOSU in bars- unsteady and needs UE support  Balance on BOSU Mini squat on BOSU- min-modA  On BOSU ball toss - modA     01/27/24 Standing VOR horizontal 2x60 seconds Standing VOR vertical 2x60 seconds- still more nauseous with cervical flexion/extension Standing marches on blue foam pad cues to stay in middle of pad x20  Standing on blue foam pad + cross midline reaching to target progressive difficulty (cues to find target with eyes/head before reaching for it) Standing on blue foam pad + cross midline toe taps to targets (cues to find target with eyes/head before tapping) SLS with one foot on soft surface of BOSU/one foot on floor 3x30 seconds B  Alternating toe taps to BOSU from solid surface x20    01/21/24 NuStep L1 x67mins  Seated VOR x1 horizontal and vertical x15  Standing head turns horizontal and vertical x15-- dizzy and a little nauseous when looking up  Walking with head turns 5 steps down and back x2  Standing marches  Standing on airex reaching for numbers  Standing on airex ball toss then volleyball hits   01/20/24 Tandem gait on blue foam pad x3 laps  Vor horizontal 2x60 seconds  Side steps on foam x3 laps  Saccades horizontal and vertical STS with horizontal VOR x3  Vomited after side stepping on foam- provided warm washcloth and mentos   Seated UT stretch 2x30 seconds B Levator stretch 2x30 seconds B  Chin tucks x10 Mod cues     01/16/24  Tandem stance blue foam pad 3x30 seconds B intermittent BUE support on bars, min guard  Standing on blue foam pad with forward taps to targets x10 B, Min guard-MinA  Standing on blue foam pad with cross midline forward toe taps x10 B, min guard-MinA  Lateral shifts on rocker board x2 minutes, eyes on target to manage motion sensitivity, min guard  SLS on blue foam pad with other foot on soft surface of BOSU 3x30 seconds   Became nauseous and actively vomiting mid session, did well with warm wash clothes and cookie      01/14/24 Seated march 20 reps alt  LAQ x10 each side  VOR x1 horizontal and vertical seated VOR x2 horizontal  and vertical seated Feet together, EC feet together  Romberg stance, tandem standing 30s  SLS 10s  Standing on airex feet together Standing on airex head turns  On airex playing catch   01/03/24    PATIENT EDUCATION: Education details: Diagnosis, Prognosis, POC, holding off on HEP Person educated: Patient and Spouse Education method: Explanation, Demonstration, Tactile cues, and Verbal cues Education comprehension: verbalized understanding, returned demonstration, verbal cues required, tactile cues required, and needs further education  HOME EXERCISE PROGRAM:  Access Code: [email or web address] + up and walking 5-10 minutes every hour awake  URL: [email or web address] Date: 01/20/2024 Prepared by: Nedra Hai  Exercises - Tandem Stance in Corner  - 1-2 x daily - 7 x weekly - 1 sets - 3 reps -  30 seconds  hold - Seated Gaze Stabilization with Head Rotation  - 1-2 x daily - 7 x weekly - 1 sets - 2 reps - 60 seconds  hold   GOALS: Goals reviewed with patient? No  SHORT TERM GOALS: Target date: 01/25/24  Initial HEP will be prescribed by 3rd visit Baseline: not provided  Goal status: MET 01/20/24  2.  Headache pain will not increase above 7/10 during session.  Baseline: started at 7/10, pt verbalized it was worse at end of eval  Goal status: ongoing 01/21/24, MET 01/28/24  3.  Activity increase will not elicit vomiting. Baseline: vomited at end of eval after BERG testing. Goal status: ongoing 01/21/24, doing harder tasks in sessions no vomiting in 3 sessions 01/28/24, progressing 02/10/24   LONG TERM GOALS: Target date: 03/09/24  Pt will be independent with advanced HEP, as prescribed throughout POC. Baseline: not prescribed yet Goal status: INITIAL  2.  Pt will decrease HA pain to </= 4/10 with activity.  Baseline: > 7/10 Goal status: progressing 02/10/24  3.  Pt will increase BERG balance test score to >/= 52/56. Baseline: 48/56 Goal status: MET 54/56 02/04/24  4.  Pt  will complete full session without vomiting or any other adverse reactions.  Baseline: vomited after eval Goal status: progressing 2 sessions back to back w/vomit, 2 sessions without, MET 02/10/24  5. Pt will complete SLS and tandem on firm and foam without LOB >10s   Baseline: 5s  Goal status: INITIAL     ASSESSMENT:  CLINICAL IMPRESSION: Pt arrives today doing OK, he reports that he is slowly feeling better every day and progressing towards his goals. He had some difficulty with coordination on airex pad playing catch while simultaneously throwing ball. Resisted gait was harder when having to control balance with LLE. Also some difficulty and LOB with step ups to single leg on 6" step.     OBJECTIVE IMPAIRMENTS: decreased activity tolerance, decreased balance, difficulty walking, decreased safety awareness, impaired perceived functional ability, increased muscle spasms, and pain.   ACTIVITY LIMITATIONS: standing, stairs, and locomotion level  PARTICIPATION LIMITATIONS: cleaning, laundry, driving, shopping, community activity, and occupation  PERSONAL FACTORS: 1 comorbidity: history of stroke  are also affecting patient's functional outcome.   REHAB POTENTIAL: Good  CLINICAL DECISION MAKING: Evolving/moderate complexity  EVALUATION COMPLEXITY: Moderate  PLAN:  PT FREQUENCY: 1-2x/week  PT DURATION: 8 weeks  PLANNED INTERVENTIONS: 97164- PT Re-evaluation, 97110-Therapeutic exercises, 97530- Therapeutic activity, [id number]- Neuromuscular re-education, 97535- Self Care, [address]- Manual therapy, and [id number]- Gait training  PLAN FOR NEXT SESSION:   Cassie Freer, PT, DPT 02/12/24 10:44 AM

## 2024-02-12 NOTE — Patient Instructions (Addendum)
 Continue your scopalamine patch  Continue as needed meclizine; you can take up to 50 mg in a single dose, but do not exceed 100 mg daily  Wait a month and, if you are feeling good on car trips without nausea or motion sickness, look into OT driving evaluations before returning to driving; form provided today.   I will fill out return to work forms and fax them ASAP - indicating 8 hours/day with 30 minute rest break every 2 hours, and 2 hours/ 2x per week for therapies, and possible 2 hours off 2x weekly for exacerbations up to 05/13/24; cannot lift 25 lbs overhead due to balance issues but should not interfere with [facility]. did return foster care forms today  Follow up in 3 months

## 2024-02-13 ENCOUNTER — Ambulatory Visit: Payer: 59

## 2024-02-13 DIAGNOSIS — M6281 Muscle weakness (generalized): Secondary | ICD-10-CM | POA: Diagnosis not present

## 2024-02-13 DIAGNOSIS — R41842 Visuospatial deficit: Secondary | ICD-10-CM

## 2024-02-13 DIAGNOSIS — I619 Nontraumatic intracerebral hemorrhage, unspecified: Secondary | ICD-10-CM

## 2024-02-13 DIAGNOSIS — R2689 Other abnormalities of gait and mobility: Secondary | ICD-10-CM

## 2024-02-13 DIAGNOSIS — R278 Other lack of coordination: Secondary | ICD-10-CM

## 2024-02-13 DIAGNOSIS — R262 Difficulty in walking, not elsewhere classified: Secondary | ICD-10-CM

## 2024-02-13 DIAGNOSIS — R42 Dizziness and giddiness: Secondary | ICD-10-CM

## 2024-02-15 MED ORDER — MECLIZINE HCL 50 MG PO TABS: 25.0000 mg | ORAL_TABLET | Freq: Three times a day (TID) | ORAL | 2 refills | Status: DC | PRN

## 2024-02-17 ENCOUNTER — Ambulatory Visit: Payer: 59 | Admitting: Occupational Therapy

## 2024-02-19 ENCOUNTER — Ambulatory Visit: Payer: 59 | Admitting: Occupational Therapy

## 2024-02-19 DIAGNOSIS — R41842 Visuospatial deficit: Secondary | ICD-10-CM

## 2024-02-19 DIAGNOSIS — R2689 Other abnormalities of gait and mobility: Secondary | ICD-10-CM

## 2024-02-19 DIAGNOSIS — M6281 Muscle weakness (generalized): Secondary | ICD-10-CM | POA: Diagnosis not present

## 2024-02-19 DIAGNOSIS — R278 Other lack of coordination: Secondary | ICD-10-CM

## 2024-02-19 NOTE — Patient Instructions (Addendum)
 Memory Compensation Strategies  Use "WARM" strategy.  W= write it down  A= associate it  R= repeat it  M= make a mental note  2.   You can keep a Glass blower/designer.  Use a 3-ring notebook with sections for the following: calendar, important names and phone numbers,  medications, doctors' names/phone numbers, lists/reminders, and a section to journal what you did  each day.   3.    Use a calendar to write appointments down.  4.    Write yourself a schedule for the day.  This can be placed on the calendar or in a separate section of the Memory Notebook.  Keeping a regular schedule can help memory.  5.    Use medication organizer with sections for each day or morning/evening pills.  You may need help loading it  6.    Keep a basket, or pegboard by the door.  Place items that you need to take out with you in the basket or on the pegboard.  You may also want to  include a message board for reminders.  7.    Use sticky notes.  Place sticky notes with reminders in a place where the task is performed.  For example: " turn off the  stove" placed by the stove, "lock the door" placed on the door at eye level, " take your medications" on  the bathroom mirror or by the place where you normally take your medications.  8.    Use alarms/timers.  Use while cooking to remind yourself to check on food or as a reminder to take your medicine, or as a  reminder to make a call, or as a reminder to perform another task, etc.    Local Driver Evaluation Programs:  Comprehensive Evaluation: includes clinical and in vehicle behind the wheel testing by OCCUPATIONAL THERAPIST. Programs have varying levels of adaptive controls available for trial.   [employer], [place] [address] [place], [place]  [address] [phone] or [phone] [email or web address] Evaluator:  Elvera Lennox, OT/CDRS/CDI/SCDCM/Low Vision Certification  [facility] [address] [place], [place] [address] [phone] [email or web address] Evaluators:  Rockwell Alexandria, OT and Yves Dill, OT  [facility]te Stable) [facility] [place] (ONLY SERVES VETERANS!!) [facility] [address] [place], [place]  [address] [phone] [email or web address] Evaluators:  Randall Hiss, KT; Rayburn Felt, KT;  Sheran Luz, KT (KT=kiniesotherapist)         Resource List What is a Industrial/product designer: Your Road Ahead - A Guide to [employer] Evaluations http://www.thehartford.com/resources/mature-market-excellence/publications-on-aging  Association for Academic librarian - Disability and Driving Fact Sheets http://www.aded.net/?page=510  Driving after a Brain Injury: Brain Injury Association of [employer] VCShow.co.za?A=SearchResult&SearchID=9495675&ObjectID=2758842&ObjectType=35  Driving with Adaptive Equipment: [employer] https://aguirre-arnold.org/

## 2024-02-19 NOTE — Therapy (Unsigned)
 OUTPATIENT OCCUPATIONAL THERAPY NEURO Treatment  Patient Name: Wayne Lee MRN: [id number] DOB:[birth year], 48 y.o., male Today's Date: 02/20/2024  PCP: none REFERRING PROVIDER: Dr. Shearon Stalls  END OF SESSION:  OT End of Session - 02/19/24 1612     Visit Number 7    Number of Visits 25    Date for OT Re-Evaluation 03/31/24    Authorization Type aetna    Authorization Time Period 12 weeks    OT Start Time 1535    OT Stop Time 1632    OT Time Calculation (min) 57 min    Activity Tolerance Patient tolerated treatment well    Behavior During Therapy WFL for tasks assessed/performed   pt vomited end of session                  Past Medical History:  Diagnosis Date   Atopic dermatitis in adult    Left cataract    Obesity (BMI 30-39.9)    Stroke [facility])    Undescended left testicle    had surgical intervention   Past Surgical History:  Procedure Laterality Date   CATARACT EXTRACTION W/ INTRAOCULAR LENS IMPLANT Left 2017   CHOLECYSTECTOMY     ORCHIOPEXY  1984   SUBOCCIPITAL CRANIECTOMY CERVICAL LAMINECTOMY N/A 12/16/2023   Procedure: SUBOCCIPITAL CRANIECTOMY;  Surgeon: Barnett Abu, MD;  Location: [facility] OR;  Service: Neurosurgery;  Laterality: N/A;   Patient Active Problem List   Diagnosis Date Noted   Chronic fatigue 02/12/2024   Nausea and vomiting 01/13/2024   Central nervous system origin vertigo, unspecified laterality 01/13/2024   Impaired mobility and ADLs 01/13/2024   Bilateral headaches 12/31/2023   ICH (intracerebral hemorrhage) (HCC) 12/20/2023   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   Hypertension 12/19/2023   Brain compression (HCC) 12/19/2023   Nontraumatic acute cerebral hemorrhage (HCC) 12/16/2023   Cerebellar hemorrhage (HCC) 12/16/2023    ONSET DATE: 12/16/23  REFERRING DIAG: I61.9 (ICD-10-CM) - Nontraumatic acute cerebral hemorrhage (HCC)   THERAPY DIAG:  Muscle weakness (generalized)  Other lack of coordination  Other  abnormalities of gait and mobility  Visuospatial deficit  Rationale for Evaluation and Treatment: Rehabilitation  SUBJECTIVE:   SUBJECTIVE STATEMENT: Pt and wife report that the meet went well this weekend Pt accompanied by: significant other  PERTINENT HISTORY: 48 yo male arrival 1/6 with HTN 204/106 taken to emergent OR hematoma evacuation s/p suboccipital craniotomy and hypertonic saline started. Extubated 1/7 PMH obesity, sleep apnea, HLD d/c home 1/21/5 Per pt's wife AVM  CT Head without contrast: Large acute 5.3 cm intraparenchymal hemorrhage in the left cerebellum. Mass effect with narrowed fourth ventricle, but no hydrocephalus at this time. Basal cisterns are effaced and there is early ascending transtentorial herniation. PRECAUTIONS: Fall  WEIGHT BEARING RESTRICTIONS: No  PAIN: no pain today FALLS: Has patient fallen in last 6 months? No  LIVING ENVIRONMENT: Lives with: lives with their family and lives with their spouse Lives in: House/apartment Stairs: yes  Has following equipment at home: None  PLOF: Independent  PATIENT GOALS: return to prior level  OBJECTIVE:  Note: Objective measures were completed at Evaluation unless otherwise noted.  HAND DOMINANCE: Right  ADLs: Overall ADLs: mod I-supervision with basic ADLS Transfers/ambulation related to ADLs: Eating: mod I Grooming: mod I UB Dressing: mod I LB Dressing: mod I Toileting: mod I Bathing: supervision, sits in bottom of bathtub Tub Shower transfers: supervision   IADLs:dependent for IADLS Medication management: wife is helping  Financial management: dependent   MOBILITY STATUS:  mod I-supervision    ACTIVITY TOLERANCE: Activity tolerance: limited by nausea   UPPER EXTREMITY ROM:  WFLS   UPPER EXTREMITY MMT:     MMT Right eval Left eval  Shoulder flexion 4+/5 4/5  Shoulder abduction    Shoulder adduction    Shoulder extension    Shoulder internal rotation    Shoulder  external rotation    Middle trapezius    Lower trapezius    Elbow flexion 4+/5 4+/5  Elbow extension 4+/5 4/5  Wrist flexion    Wrist extension    Wrist ulnar deviation    Wrist radial deviation    Wrist pronation    Wrist supination    (Blank rows = not tested)  HAND FUNCTION: Grip strength: Right: 63 lbs; Left: 48 lbs  COORDINATION: 9 Hole Peg test: Right: 26.25 sec; Left: 32.80 with several drops sec  SENSATION: WFL  COGNITION: Overall cognitive status:  recalls 3/3 words following short delay, spells WORLD backwards without difficulty  VISION: Subjective report: Pt reports double vision at times   VISION ASSESSMENT: To be further assessed in functional context Pt reports diplopia at times He was able to track to all 4 quadrants and visual fiels were intact. He became nauseous and vomited shortly after vision testing. Pt appears to have some vestibular issues.  Patient has difficulty with following activities due to following visual impairments: reading    OBSERVATIONS: Pleasant agreeable male, accompanied by his wife and 1 y.o son Pt's BP was elevated at end of session when he was vomiting 140/102, and 148/115, pt was instructed to monitor at home and to contact MD if diastolic BP does not reduce to below 100. pt's wife verbalized understanding                                                                                                                             TREATMENT DATE: 02/19/24- Organizing your day task for attention to detail, and reading tolerance in prep for return to work . Pt has mod difficulty with task. He initally omitted 2 items and he did not account for the tempeture when buying groceries. He alos missed the detail requiring him to register for class at a specific time. Activity took pt the entire session and he reuired v.c to correct errors. Pt reported fatigue with task at end of session. Therapist discsussed implications with similar  activities with return to work and discussed memory strategies and reinforced improtance of writing things down rather than trying to remember. Pt returns to work next week.  3/03/25Reading activity to read larger print items on the I pad then smaller article on the computer. Pt reports it is not blurry but he has some visual fatigue. Pt was instructed in theraband exercises: green band for shoulder abduction, then blue for remaining exercises- see pt instructions, 15 reps each, min v.c for positioning. Pt was isntructed that he can try blue band for shoulder abduction, however he  should stop if he has any increased pain.  02/05/24- constant therapy alternating symbols 88% accuracy, 42.62 secs response time, for alternating attention with visual component Ambulating while tossing ball and performing category generation, only 2-3 cues for entire alphabet. Copying small peg design with LUE for increased fine motor coordiantion with a visual and cognitive component, min difficulty/ v.c.  Removing with in hand manipulation, min-mod drops. UBE x 6 mins level 3 for conditioning and reciprocal movement   01/30/24- Constant therapy:Put steps in order( for reading activity), 100% accuracy, no increased dizziness.  Followed by reading a map 80% acuracy, 35.52 response time, due to pt moving quickly and mistakenly hitting one itme and one error due to not double checking. Reading accuracy 80% accuracy. Grooved pegs for increased fine motor coordination, min difficulty/ drops, min v.c Environmental scanning to locate items 1-9 in squential order, pt with good success. Amb while tossing ball and naming animals for every letter of alphabet, min questionsing cues.  01/22/24- see pt. education Copying small peg design with LUE for fine motor coordination with a visual componant, min difficulty/ drops for coordination. Pt copied design correctly without v.c Number cancellation 48M with 100% accuracy and no reports of  dizziness nausea    01/15/24-coordiantion HEP issued, see education  01/07/24 eval        PATIENT EDUCATION: Education details:beginning recommendations for return to work, Estate agent, driving eval info Person educated: Patient and Spouse Education method: Explanation, demonstration, v.c , pt left without handout- PT to give to patient Thursday Education comprehension: verbalized understanding, returned demonstration,   HOME EXERCISE PROGRAM: coordination, putty   GOALS: Goals reviewed with patient? Yes  SHORT TERM GOALS: Target date: 02/06/24  I with HEP  Goal status: met, 01/22/24- demonstrates understanding of putty exercises  2.  Pt will demonstrate improved fine motor coordination as evidenced by decreasing 9 hole peg test LUE to 29 secs or less without several drops.  Goal status:met, 27.07 secs  3.  Pt will increase LUE grip strength to 52 lbs or greater for increased ease with daily activities.  Goal status: met, 63 lbs  4.  Pt will perfrom tabletop scanning without diplopia or vomiting with 90% or better accuracy.  Goal status: ongoing, perfroms reading without diplopia however visual fatigue. 02/19/24  5.  Pt will perfrom transitional movements for ADLS/ IADLs without LOB or nausea greater than 3/10.  Goal status: ongoing 02/19/24  6. I with adapted strategies for ADLs/IADLs to improve safety and I.   Goal status: ongoing 02/19/24  LONG TERM GOALS: Target date: 03/31/24  I with updated HEP  Goal status: INITIAL  2.  Pt will perform mod complex home management mod I  Goal status: INITIAL  3.  Pt will perform environmental scanning in a busy environment with 90% or better accuracy, without vomiting  Goal status: ongoing  4.  Pt will perfrom simulated work activities mod I  Goal status: ongoing    ASSESSMENT:  CLINICAL IMPRESSION: Pt is progressing towards goals. He demonstrated difficulty with a novel organization task today.  Therapist discussed recommendations for returning to work and importance of organization/ memory strategies.PERFORMANCE DEFICITS: in functional skills including ADLs, IADLs, coordination, dexterity, ROM, strength, Fine motor control, Gross motor control, balance, endurance, decreased knowledge of precautions, decreased knowledge of use of DME, vision, UE functional use, and vestibular, cognitive skills including safety awareness, and psychosocial skills including coping strategies, environmental adaptation, habits, interpersonal interactions, and routines and behaviors.   IMPAIRMENTS: are limiting  patient from ADLs, IADLs, rest and sleep, work, play, leisure, and social participation.   CO-MORBIDITIES: may have co-morbidities  that affects occupational performance. Patient will benefit from skilled OT to address above impairments and improve overall function.  MODIFICATION OR ASSISTANCE TO COMPLETE EVALUATION: Min-Moderate modification of tasks or assist with assess necessary to complete an evaluation.  OT OCCUPATIONAL PROFILE AND HISTORY: Detailed assessment: Review of records and additional review of physical, cognitive, psychosocial history related to current functional performance.  CLINICAL DECISION MAKING: LOW - limited treatment options, no task modification necessary  REHAB POTENTIAL: Good  EVALUATION COMPLEXITY: Low    PLAN:  OT FREQUENCY: 2x/week plus eval  OT DURATION: 12 weeks  PLANNED INTERVENTIONS: 97168 OT Re-evaluation, 97535 self care/ADL training, 96045 therapeutic exercise, 97530 therapeutic activity, 97112 neuromuscular re-education, 97140 manual therapy, 97116 gait training, 40981 aquatic therapy, 97035 ultrasound, 97018 paraffin, 19147 moist heat, 97010 cryotherapy, 97034 contrast bath, balance training, functional mobility training, visual/perceptual remediation/compensation, energy conservation, coping strategies training, patient/family education, and DME and/or AE  instructions  RECOMMENDED OTHER SERVICES: PT  CONSULTED AND AGREED WITH PLAN OF CARE: Patient  PLAN FOR NEXT SESSION:   check on how return to work is going and make recommendations  Seng Fouts, OT 02/20/2024, 8:39 AM                     OUTPATIENT OCCUPATIONAL THERAPY NEURO Treatment  Patient Name: Wayne Lee MRN: [id number] DOB:10-14-1976, 48 y.o., male Today's Date: 02/20/2024  PCP: none REFERRING PROVIDER: Dr. Shearon Stalls  END OF SESSION:  OT End of Session - 02/19/24 1612     Visit Number 7    Number of Visits 25    Date for OT Re-Evaluation 03/31/24    Authorization Type aetna    Authorization Time Period 12 weeks    OT Start Time 1535    OT Stop Time 1632    OT Time Calculation (min) 57 min    Activity Tolerance Patient tolerated treatment well    Behavior During Therapy WFL for tasks assessed/performed   pt vomited end of session                  Past Medical History:  Diagnosis Date   Atopic dermatitis in adult    Left cataract    Obesity (BMI 30-39.9)    Stroke [facility])    Undescended left testicle    had surgical intervention   Past Surgical History:  Procedure Laterality Date   CATARACT EXTRACTION W/ INTRAOCULAR LENS IMPLANT Left 2017   CHOLECYSTECTOMY     ORCHIOPEXY  1984   SUBOCCIPITAL CRANIECTOMY CERVICAL LAMINECTOMY N/A 12/16/2023   Procedure: SUBOCCIPITAL CRANIECTOMY;  Surgeon: Barnett Abu, MD;  Location: [facility] OR;  Service: Neurosurgery;  Laterality: N/A;   Patient Active Problem List   Diagnosis Date Noted   Chronic fatigue 02/12/2024   Nausea and vomiting 01/13/2024   Central nervous system origin vertigo, unspecified laterality 01/13/2024   Impaired mobility and ADLs 01/13/2024   Bilateral headaches 12/31/2023   ICH (intracerebral hemorrhage) (HCC) 12/20/2023   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   Hypertension 12/19/2023   Brain compression (HCC) 12/19/2023   Nontraumatic acute cerebral  hemorrhage (HCC) 12/16/2023   Cerebellar hemorrhage (HCC) 12/16/2023    ONSET DATE: 12/16/23  REFERRING DIAG: I61.9 (ICD-10-CM) - Nontraumatic acute cerebral hemorrhage (HCC)   THERAPY DIAG:  Muscle weakness (generalized)  Other lack of coordination  Other abnormalities of gait and mobility  Visuospatial deficit  Rationale for Evaluation  and Treatment: Rehabilitation  SUBJECTIVE:   SUBJECTIVE STATEMENT: Pt reports doesn't have any pain today Pt accompanied by: significant other  PERTINENT HISTORY: 48 yo male arrival 1/6 with HTN 204/106 taken to emergent OR hematoma evacuation s/p suboccipital craniotomy and hypertonic saline started. Extubated 1/7 PMH obesity, sleep apnea, HLD d/c home 1/21/5 Per pt's wife AVM  CT Head without contrast: Large acute 5.3 cm intraparenchymal hemorrhage in the left cerebellum. Mass effect with narrowed fourth ventricle, but no hydrocephalus at this time. Basal cisterns are effaced and there is early ascending transtentorial herniation. PRECAUTIONS: Fall  WEIGHT BEARING RESTRICTIONS: No  PAIN: no pain today FALLS: Has patient fallen in last 6 months? No  LIVING ENVIRONMENT: Lives with: lives with their family and lives with their spouse Lives in: House/apartment Stairs: yes  Has following equipment at home: None  PLOF: Independent  PATIENT GOALS: return to prior level  OBJECTIVE:  Note: Objective measures were completed at Evaluation unless otherwise noted.  HAND DOMINANCE: Right  ADLs: Overall ADLs: mod I-supervision with basic ADLS Transfers/ambulation related to ADLs: Eating: mod I Grooming: mod I UB Dressing: mod I LB Dressing: mod I Toileting: mod I Bathing: supervision, sits in bottom of bathtub Tub Shower transfers: supervision   IADLs:dependent for IADLS Medication management: wife is Teacher, English as a foreign language: dependent   MOBILITY STATUS:  mod I-supervision    ACTIVITY TOLERANCE: Activity tolerance:  limited by nausea   UPPER EXTREMITY ROM:  WFLS   UPPER EXTREMITY MMT:     MMT Right eval Left eval  Shoulder flexion 4+/5 4/5  Shoulder abduction    Shoulder adduction    Shoulder extension    Shoulder internal rotation    Shoulder external rotation    Middle trapezius    Lower trapezius    Elbow flexion 4+/5 4+/5  Elbow extension 4+/5 4/5  Wrist flexion    Wrist extension    Wrist ulnar deviation    Wrist radial deviation    Wrist pronation    Wrist supination    (Blank rows = not tested)  HAND FUNCTION: Grip strength: Right: 63 lbs; Left: 48 lbs  COORDINATION: 9 Hole Peg test: Right: 26.25 sec; Left: 32.80 with several drops sec  SENSATION: WFL  COGNITION: Overall cognitive status:  recalls 3/3 words following short delay, spells WORLD backwards without difficulty  VISION: Subjective report: Pt reports double vision at times   VISION ASSESSMENT: To be further assessed in functional context Pt reports diplopia at times He was able to track to all 4 quadrants and visual fiels were intact. He became nauseous and vomited shortly after vision testing. Pt appears to have some vestibular issues.  Patient has difficulty with following activities due to following visual impairments: reading    OBSERVATIONS: Pleasant agreeable male, accompanied by his wife and 1 y.o son Pt's BP was elevated at end of session when he was vomiting 140/102, and 148/115, pt was instructed to monitor at home and to contact MD if diastolic BP does not reduce to below 100. pt's wife verbalized understanding  TREATMENT DATE:Reading activity to read larger print items on the I pad then smaller article on the computer. Pt reports it is not blurry but he has some visual fatigue. Pt was instructed in theraband exercises: green band for shoulder abduction, then blue for  remaining exercises- see pt instructions, 15 reps each, min v.c for positioning. Pt was isntructed that he can try blue band for shoulder abduction, however he should stop if he ahs any increased pain.  02/05/24- constant therapy alternating symbols 88% accuracy, 42.62 secs response time, for alternating attention with visual component Ambulating while tossing ball and performing category generation, only 2-3 cues for entire alphabet. Copying small peg design with LUE for increased fine motor coordiantion with a visual and cognitive component, min difficulty/ v.c.  Removing with in hand manipulation, min-mod drops. UBE x 6 mins level 3 for conditioning and reciprocal movement   01/30/24- Constant therapy:Put steps in order( for reading activity), 100% accuracy, no increased dizziness.  Followed by reading a map 80% acuracy, 35.52 response time, due to pt moving quickly and mistakenly hitting one itme and one error due to not double checking. Reading accuracy 80% accuracy. Grooved pegs for increased fine motor coordination, min difficulty/ drops, min v.c Environmental scanning to locate items 1-9 in squential order, pt with good success. Amb while tossing ball and naming animals for every letter of alphabet, min questionsing cues.  01/22/24- see pt. education Copying small peg design with LUE for fine motor coordination with a visual componant, min difficulty/ drops for coordination. Pt copied design correctly without v.c Number cancellation 14M with 100% accuracy and no reports of dizziness nausea    01/15/24-coordiantion HEP issued, see education  01/07/24 eval        PATIENT EDUCATION: Education details:blue and green theraband exercises, recommendation that pt gradually increases activity level at home and he begins practicing reading on computer for return to work. Person educated: Patient and Spouse Education method: Explanation, demonstration, v.c  Education comprehension:  verbalized understanding, returned demonstration,   HOME EXERCISE PROGRAM: coordination, putty   GOALS: Goals reviewed with patient? Yes  SHORT TERM GOALS: Target date: 02/06/24  I with HEP  Goal status: met, 01/22/24- demonstrates understanding of putty exercises  2.  Pt will demonstrate improved fine motor coordination as evidenced by decreasing 9 hole peg test LUE to 29 secs or less without several drops.  Goal status:met, 27.07 secs  3.  Pt will increase LUE grip strength to 52 lbs or greater for increased ease with daily activities.  Goal status: met, 63 lbs  4.  Pt will perfrom tabletop scanning without diplopia or vomiting with 90% or better accuracy.  Goal status: ongoing  5.  Pt will perfrom transitional movements for ADLS/ IADLs without LOB or nausea greater than 3/10.  Goal status: ongoing  6. I with adapted strategies for ADLs/IADLs to improve safety and I.   Goal status: ongoing  LONG TERM GOALS: Target date: 03/31/24  I with updated HEP  Goal status: INITIAL  2.  Pt will perform mod complex home management mod I  Goal status: INITIAL  3.  Pt will perform environmental scanning in a busy environment with 90% or better accuracy, without vomiting  Goal status: ongoing  4.  Pt will perfrom simulated work activities mod I  Goal status: ongoing    ASSESSMENT:  CLINICAL IMPRESSION: Pt is progressing towards goals. He demonstrates improving RUE  coordiantion. Pt demonstrates understanding of theraband HEP.Marland KitchenPERFORMANCE DEFICITS: in functional skills including ADLs,  IADLs, coordination, dexterity, ROM, strength, Fine motor control, Gross motor control, balance, endurance, decreased knowledge of precautions, decreased knowledge of use of DME, vision, UE functional use, and vestibular, cognitive skills including safety awareness, and psychosocial skills including coping strategies, environmental adaptation, habits, interpersonal interactions, and routines and  behaviors.   IMPAIRMENTS: are limiting patient from ADLs, IADLs, rest and sleep, work, play, leisure, and social participation.   CO-MORBIDITIES: may have co-morbidities  that affects occupational performance. Patient will benefit from skilled OT to address above impairments and improve overall function.  MODIFICATION OR ASSISTANCE TO COMPLETE EVALUATION: Min-Moderate modification of tasks or assist with assess necessary to complete an evaluation.  OT OCCUPATIONAL PROFILE AND HISTORY: Detailed assessment: Review of records and additional review of physical, cognitive, psychosocial history related to current functional performance.  CLINICAL DECISION MAKING: LOW - limited treatment options, no task modification necessary  REHAB POTENTIAL: Good  EVALUATION COMPLEXITY: Low    PLAN:  OT FREQUENCY: 2x/week plus eval  OT DURATION: 12 weeks  PLANNED INTERVENTIONS: 97168 OT Re-evaluation, 97535 self care/ADL training, 69629 therapeutic exercise, 97530 therapeutic activity, 97112 neuromuscular re-education, 97140 manual therapy, 97116 gait training, 52841 aquatic therapy, 97035 ultrasound, 97018 paraffin, 32440 moist heat, 97010 cryotherapy, 97034 contrast bath, balance training, functional mobility training, visual/perceptual remediation/compensation, energy conservation, coping strategies training, patient/family education, and DME and/or AE instructions  RECOMMENDED OTHER SERVICES: PT  CONSULTED AND AGREED WITH PLAN OF CARE: Patient  PLAN FOR NEXT SESSIONKeene Breath, OT 02/20/2024, 8:39 AM

## 2024-02-20 ENCOUNTER — Ambulatory Visit: Admitting: Physical Therapy

## 2024-02-20 ENCOUNTER — Ambulatory Visit: Payer: 59

## 2024-02-20 ENCOUNTER — Encounter: Payer: Self-pay | Admitting: Physical Therapy

## 2024-02-20 DIAGNOSIS — R41842 Visuospatial deficit: Secondary | ICD-10-CM

## 2024-02-20 DIAGNOSIS — R262 Difficulty in walking, not elsewhere classified: Secondary | ICD-10-CM

## 2024-02-20 DIAGNOSIS — R42 Dizziness and giddiness: Secondary | ICD-10-CM

## 2024-02-20 DIAGNOSIS — M6281 Muscle weakness (generalized): Secondary | ICD-10-CM | POA: Diagnosis not present

## 2024-02-20 DIAGNOSIS — R278 Other lack of coordination: Secondary | ICD-10-CM

## 2024-02-20 DIAGNOSIS — R2689 Other abnormalities of gait and mobility: Secondary | ICD-10-CM

## 2024-02-20 NOTE — Therapy (Signed)
 OUTPATIENT PHYSICAL THERAPY NEURO TREATMENT    Patient Name: Wayne Lee MRN: [id number] DOB:[birth year], 48 y.o., male Today's Date: 02/20/2024   PCP: No PCP REFERRING PROVIDER: Delle Reining, PA-C  END OF SESSION:  PT End of Session - 02/20/24 1617     Visit Number 12    Number of Visits 16    Date for PT Re-Evaluation 03/27/24    Authorization Type [employer]    PT Start Time 1610    PT Stop Time 1700    PT Time Calculation (min) 50 min    Activity Tolerance Patient tolerated treatment well    Behavior During Therapy WFL for tasks assessed/performed                      Past Medical History:  Diagnosis Date   Atopic dermatitis in adult    Left cataract    Obesity (BMI 30-39.9)    Stroke [facility])    Undescended left testicle    had surgical intervention   Past Surgical History:  Procedure Laterality Date   CATARACT EXTRACTION W/ INTRAOCULAR LENS IMPLANT Left 2017   CHOLECYSTECTOMY     ORCHIOPEXY  1984   SUBOCCIPITAL CRANIECTOMY CERVICAL LAMINECTOMY N/A 12/16/2023   Procedure: SUBOCCIPITAL CRANIECTOMY;  Surgeon: Barnett Abu, MD;  Location: [facility] OR;  Service: Neurosurgery;  Laterality: N/A;   Patient Active Problem List   Diagnosis Date Noted   Chronic fatigue 02/12/2024   Nausea and vomiting 01/13/2024   Central nervous system origin vertigo, unspecified laterality 01/13/2024   Impaired mobility and ADLs 01/13/2024   Bilateral headaches 12/31/2023   ICH (intracerebral hemorrhage) (HCC) 12/20/2023   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   Hypertension 12/19/2023   Brain compression (HCC) 12/19/2023   Nontraumatic acute cerebral hemorrhage (HCC) 12/16/2023   Cerebellar hemorrhage (HCC) 12/16/2023    ONSET DATE: 12/15/22  REFERRING DIAG:  Diagnosis  I61.9 (ICD-10-CM) - Nontraumatic acute cerebral hemorrhage (HCC)    THERAPY DIAG:  Muscle weakness (generalized)  Other lack of coordination  Other abnormalities of gait  and mobility  Visuospatial deficit  Dizziness and giddiness  Difficulty in walking, not elsewhere classified  Rationale for Evaluation and Treatment: Rehabilitation  SUBJECTIVE:                                                                                                                                                                                             SUBJECTIVE STATEMENT: Doing pretty good, took out the trash  Pt accompanied by: self  PERTINENT HISTORY: 1/6 stroke, ICU and IRF leaving hospital 1/21  PAIN:  Are you having pain?  No 0/10  PRECAUTIONS: None  RED FLAGS: None   WEIGHT BEARING RESTRICTIONS: No  FALLS: Has patient fallen in last 6 months? No  LIVING ENVIRONMENT: Lives with: lives with their family and lives with their spouse Lives in: House/apartment Stairs: Yes: Internal: 15 steps; can reach both and External: 2 steps; none front door 4 steps rails on both sides Has following equipment at home: None  PLOF:  supervision for I/ADLs  PATIENT GOALS: walking, energy, endurance  OBJECTIVE:  Note: Objective measures were completed at Evaluation unless otherwise noted.  DIAGNOSTIC FINDINGS:  Nothing new  COGNITION: Overall cognitive status: Within functional limits for tasks assessed   SENSATION: WFL  COORDINATION: Toe taps and heel-to-shin normal  EDEMA:  None  MUSCLE TONE: RLE: Within functional limits  MUSCLE LENGTH: NT  POSTURE:  very wide BOS   LOWER EXTREMITY ROM:   WFL   Active  Right Eval Left Eval  Hip flexion    Hip extension    Hip abduction    Hip adduction    Hip internal rotation    Hip external rotation    Knee flexion    Knee extension    Ankle dorsiflexion    Ankle plantarflexion    Ankle inversion    Ankle eversion     (Blank rows = not tested)  LOWER EXTREMITY MMT:    MMT Right Eval Left Eval  Hip flexion 5/5 5/5  Hip extension    Hip abduction 5/5 5/5  Hip adduction 5/5 5/5  Hip internal  rotation    Hip external rotation    Knee flexion 5/5 5/5  Knee extension 5/5 5/5  Ankle dorsiflexion    Ankle plantarflexion    Ankle inversion    Ankle eversion    (Blank rows = not tested)  BED MOBILITY:  Sit to supine Complete Independence and Modified independence Supine to sit Modified independence Rolling to Right Complete Independence Rolling to Left Complete Independence  TRANSFERS: Assistive device utilized: None  Sit to stand: Modified independence Stand to sit: Complete Independence Chair to chair: Modified independence Floor:  NT   STAIRS: Level of Assistance:  NT Stair Negotiation Technique:  with  Number of Stairs:   Height of Stairs:   Comments: NT  GAIT: Gait pattern: wide BOS, R lateral lean using wall to balance on the way back to room Distance walked: 50 ft Assistive device utilized: None Level of assistance: Min A with HHA from wife (pt did not want to, but was leaning) Comments: could potentially use AD  FUNCTIONAL TESTS:  5 times sit to stand: 9.26 seconds Berg Balance Scale: 48/56  PATIENT SURVEYS:  NT today                                                                                                                              TREATMENT DATE: 02/20/24 Gait outside around the back building good pace, a few times veered to the left,  but never a LOB Resisted gait all directions Patient holding me back with walking using tband Walking ball toss On bosu balance and then reaching On rockerboard balance and reaching On bosu catch On airex 15# straight arm pulls and chest press Leg press 40# 2x10 35# lats 2x10 15# 2x10 35# HS curls On airex cone toe touches, needed CGA due to LOB on this  02/13/24 Treadmill walk Seated row 35# 2x10 Lat pull down 35# 2x10 Obstacle course over obstacles, around cones, on airex, across beam, on 6" step min-modA  Catch on BOSU, both sides- harder on ball side  Leg extensions 15# 2x10 HS curls 25#  2x10 Resisted gait with step up x5 each side 30#    02/10/24 Recheck goals  Elliptical L1x2 mins  NuStep L5 x61mins  Resisted side steps over obstacles 30# x5 each side  On airex catch and throw at the same time with tennis balls- difficulty catching with L hand  Step up 6" to SLS x5 each side Leg press 20# 2x10  02/04/24 Redo BERG 54/56 Walking on grass  Walking big lap around back building NuStep L5 x41mins Treadmill pushes 30s x2 Walking on beam  Standing on 2 dyna disc  Step ups on BOSU x5 each side Side steps on BOSU   02/03/24 Bike L3 x9mins  Resisted gait 30# 3 way x3  Step ups 6"  Walking with ball toss and direction changes  Step ups on airex- minA  Side steps on airex- minA  Tandem walking  On airex shoulder ext 2x10  STS on airex with ball toss 2x10  01/28/24 NuStep L5x47mins Walking with eyes focused on target forwards and backwards Head turns and nods while looking at a target and walking Lateral steps on BOSU in bars- unsteady and needs UE support  Balance on BOSU Mini squat on BOSU- min-modA  On BOSU ball toss - modA    01/27/24 Standing VOR horizontal 2x60 seconds Standing VOR vertical 2x60 seconds- still more nauseous with cervical flexion/extension Standing marches on blue foam pad cues to stay in middle of pad x20  Standing on blue foam pad + cross midline reaching to target progressive difficulty (cues to find target with eyes/head before reaching for it) Standing on blue foam pad + cross midline toe taps to targets (cues to find target with eyes/head before tapping) SLS with one foot on soft surface of BOSU/one foot on floor 3x30 seconds B  Alternating toe taps to BOSU from solid surface x20     PATIENT EDUCATION: Education details: Diagnosis, Prognosis, POC, holding off on HEP Person educated: Patient and Spouse Education method: Explanation, Demonstration, Tactile cues, and Verbal cues Education comprehension: verbalized understanding,  returned demonstration, verbal cues required, tactile cues required, and needs further education  HOME EXERCISE PROGRAM:  Access Code: [email or web address] + up and walking 5-10 minutes every hour awake  URL: [email or web address] Date: 01/20/2024 Prepared by: Nedra Hai  Exercises - Tandem Stance in Corner  - 1-2 x daily - 7 x weekly - 1 sets - 3 reps - 30 seconds  hold - Seated Gaze Stabilization with Head Rotation  - 1-2 x daily - 7 x weekly - 1 sets - 2 reps - 60 seconds  hold   GOALS: Goals reviewed with patient? No  SHORT TERM GOALS: Target date: 01/25/24  Initial HEP will be prescribed by 3rd visit Baseline: not provided  Goal status: MET 01/20/24  2.  Headache pain will not increase above 7/10 during session.  Baseline: started at 7/10, pt verbalized it was worse at end of eval  Goal status: ongoing 01/21/24, MET 01/28/24  3.  Activity increase will not elicit vomiting. Baseline: vomited at end of eval after BERG testing. Goal status: ongoing 01/21/24, doing harder tasks in sessions no vomiting in 3 sessions 01/28/24, progressing 02/10/24   LONG TERM GOALS: Target date: 03/09/24  Pt will be independent with advanced HEP, as prescribed throughout POC. Baseline: not prescribed yet Goal status: ongoing 02/20/24  2.  Pt will decrease HA pain to </= 4/10 with activity.  Baseline: > 7/10 Goal status: progressing 02/10/24  3.  Pt will increase BERG balance test score to >/= 52/56. Baseline: 48/56 Goal status: MET 54/56 02/04/24  4.  Pt will complete full session without vomiting or any other adverse reactions.  Baseline: vomited after eval Goal status: progressing 2 sessions back to back w/vomit, 2 sessions without, MET 02/10/24  5. Pt will complete SLS and tandem on firm and foam without LOB >10s   Baseline: 5s  Goal status: Iprogressing 02/20/24    ASSESSMENT:  CLINICAL IMPRESSION: Advanced some activities today with Bosu and other balance activities, he really did  struggle on the airex and the cone toe touches, tended to lose balance to the right, with him just standing on the airex and throwing a ball minimal issues with balance, we also walked around the building in the back very good pace but a few times he did veer to the left but never lost balance.  C/O fatigue but no dizziness  OBJECTIVE IMPAIRMENTS: decreased activity tolerance, decreased balance, difficulty walking, decreased safety awareness, impaired perceived functional ability, increased muscle spasms, and pain.   ACTIVITY LIMITATIONS: standing, stairs, and locomotion level  PARTICIPATION LIMITATIONS: cleaning, laundry, driving, shopping, community activity, and occupation  PERSONAL FACTORS: 1 comorbidity: history of stroke  are also affecting patient's functional outcome.   REHAB POTENTIAL: Good  CLINICAL DECISION MAKING: Evolving/moderate complexity  EVALUATION COMPLEXITY: Moderate  PLAN:  PT FREQUENCY: 1-2x/week  PT DURATION: 8 weeks  PLANNED INTERVENTIONS: 97164- PT Re-evaluation, 97110-Therapeutic exercises, 97530- Therapeutic activity, [id number]- Neuromuscular re-education, 97535- Self Care, 16109- Manual therapy, and 97116- Gait training  PLAN FOR NEXT SESSION: Continue to advance balance coordination and overall functional endurance  Stacie Glaze, PT 02/20/24 4:18 PM

## 2024-02-24 ENCOUNTER — Ambulatory Visit

## 2024-02-24 ENCOUNTER — Other Ambulatory Visit: Payer: Self-pay

## 2024-02-24 ENCOUNTER — Ambulatory Visit: Payer: 59 | Admitting: Occupational Therapy

## 2024-02-24 DIAGNOSIS — R278 Other lack of coordination: Secondary | ICD-10-CM

## 2024-02-24 DIAGNOSIS — M6281 Muscle weakness (generalized): Secondary | ICD-10-CM

## 2024-02-24 DIAGNOSIS — R42 Dizziness and giddiness: Secondary | ICD-10-CM

## 2024-02-24 DIAGNOSIS — R262 Difficulty in walking, not elsewhere classified: Secondary | ICD-10-CM

## 2024-02-24 DIAGNOSIS — R2689 Other abnormalities of gait and mobility: Secondary | ICD-10-CM

## 2024-02-24 DIAGNOSIS — R41842 Visuospatial deficit: Secondary | ICD-10-CM

## 2024-02-24 NOTE — Therapy (Signed)
 OUTPATIENT PHYSICAL THERAPY NEURO TREATMENT    Patient Name: Wayne Lee MRN: [id number] DOB:1976-08-27, 48 y.o., male Today's Date: 02/24/2024   PCP: No PCP REFERRING PROVIDER: Delle Reining, PA-C  END OF SESSION:  PT End of Session - 02/24/24 1611     Visit Number 13    Date for PT Re-Evaluation 03/27/24    Authorization Type [employer]    PT Start Time [phone]    PT Stop Time 1602    PT Time Calculation (min) 44 min    Activity Tolerance Patient tolerated treatment well    Behavior During Therapy WFL for tasks assessed/performed                       Past Medical History:  Diagnosis Date   Atopic dermatitis in adult    Left cataract    Obesity (BMI 30-39.9)    Stroke [facility])    Undescended left testicle    had surgical intervention   Past Surgical History:  Procedure Laterality Date   CATARACT EXTRACTION W/ INTRAOCULAR LENS IMPLANT Left 2017   CHOLECYSTECTOMY     ORCHIOPEXY  1984   SUBOCCIPITAL CRANIECTOMY CERVICAL LAMINECTOMY N/A 12/16/2023   Procedure: SUBOCCIPITAL CRANIECTOMY;  Surgeon: Barnett Abu, MD;  Location: [facility] OR;  Service: Neurosurgery;  Laterality: N/A;   Patient Active Problem List   Diagnosis Date Noted   Chronic fatigue 02/12/2024   Nausea and vomiting 01/13/2024   Central nervous system origin vertigo, unspecified laterality 01/13/2024   Impaired mobility and ADLs 01/13/2024   Bilateral headaches 12/31/2023   ICH (intracerebral hemorrhage) (HCC) 12/20/2023   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   Hypertension 12/19/2023   Brain compression (HCC) 12/19/2023   Nontraumatic acute cerebral hemorrhage (HCC) 12/16/2023   Cerebellar hemorrhage (HCC) 12/16/2023    ONSET DATE: 12/15/22  REFERRING DIAG:  Diagnosis  I61.9 (ICD-10-CM) - Nontraumatic acute cerebral hemorrhage (HCC)    THERAPY DIAG:  Muscle weakness (generalized)  Other lack of coordination  Other abnormalities of gait and  mobility  Visuospatial deficit  Dizziness and giddiness  Difficulty in walking, not elsewhere classified  Rationale for Evaluation and Treatment: Rehabilitation  SUBJECTIVE:                                                                                                                                                                                             SUBJECTIVE STATEMENT: Doing pretty good, went to [employer] [place] this weekend for my 48 y.o gymnastics competition  Pt accompanied by: self  PERTINENT HISTORY: 1/6 stroke, ICU and IRF leaving hospital 1/21  PAIN:  Are you  having pain? No 0/10  PRECAUTIONS: None  RED FLAGS: None   WEIGHT BEARING RESTRICTIONS: No  FALLS: Has patient fallen in last 6 months? No  LIVING ENVIRONMENT: Lives with: lives with their family and lives with their spouse Lives in: House/apartment Stairs: Yes: Internal: 15 steps; can reach both and External: 2 steps; none front door 4 steps rails on both sides Has following equipment at home: None  PLOF:  supervision for I/ADLs  PATIENT GOALS: walking, energy, endurance  OBJECTIVE:  Note: Objective measures were completed at Evaluation unless otherwise noted.  DIAGNOSTIC FINDINGS:  Nothing new  COGNITION: Overall cognitive status: Within functional limits for tasks assessed   SENSATION: WFL  COORDINATION: Toe taps and heel-to-shin normal  EDEMA:  None  MUSCLE TONE: RLE: Within functional limits  MUSCLE LENGTH: NT  POSTURE:  very wide BOS   LOWER EXTREMITY ROM:   WFL   Active  Right Eval Left Eval  Hip flexion    Hip extension    Hip abduction    Hip adduction    Hip internal rotation    Hip external rotation    Knee flexion    Knee extension    Ankle dorsiflexion    Ankle plantarflexion    Ankle inversion    Ankle eversion     (Blank rows = not tested)  LOWER EXTREMITY MMT:    MMT Right Eval Left Eval  Hip flexion 5/5 5/5  Hip extension    Hip  abduction 5/5 5/5  Hip adduction 5/5 5/5  Hip internal rotation    Hip external rotation    Knee flexion 5/5 5/5  Knee extension 5/5 5/5  Ankle dorsiflexion    Ankle plantarflexion    Ankle inversion    Ankle eversion    (Blank rows = not tested)  BED MOBILITY:  Sit to supine Complete Independence and Modified independence Supine to sit Modified independence Rolling to Right Complete Independence Rolling to Left Complete Independence  TRANSFERS: Assistive device utilized: None  Sit to stand: Modified independence Stand to sit: Complete Independence Chair to chair: Modified independence Floor:  NT   STAIRS: Level of Assistance:  NT Stair Negotiation Technique:  with  Number of Stairs:   Height of Stairs:   Comments: NT  GAIT: Gait pattern: wide BOS, R lateral lean using wall to balance on the way back to room Distance walked: 50 ft Assistive device utilized: None Level of assistance: Min A with HHA from wife (pt did not want to, but was leaning) Comments: could potentially use AD  FUNCTIONAL TESTS:  5 times sit to stand: 9.26 seconds Berg Balance Scale: 48/56  PATIENT SURVEYS:  NT today                                                                                                                              TREATMENT DATE: 02/24/24:  Gait outdoors on asphalt and sidewalk, curb, for 8 min, occasional weaving,  self corrects Bosu on smooth side , throwing and catching a ball Bosu on smooth side in ll bars, reaching forward to targets, then facing side ways and reaching with same hand and across body to opposite side Resisted gait 5 x each direction 50#, lost balance more with walking backwards then change to controlling forward pace RDL's single leg, to elevated hi/low mat, 5 x each leg In ll bars for forward toe taps to cone, from 8" step In ll bars for vertical jumps x 30 sec Side weaving, over/ under, x 20' x 2 each direction, walking slowly  02/20/24 Gait  outside around the back building good pace, a few times veered to the left, but never a LOB Resisted gait all directions Patient holding me back with walking using tband Walking ball toss On bosu balance and then reaching On rockerboard balance and reaching On bosu catch On airex 15# straight arm pulls and chest press Leg press 40# 2x10 35# lats 2x10 15# 2x10 35# HS curls On airex cone toe touches, needed CGA due to LOB on this  02/13/24 Treadmill walk Seated row 35# 2x10 Lat pull down 35# 2x10 Obstacle course over obstacles, around cones, on airex, across beam, on 6" step min-modA  Catch on BOSU, both sides- harder on ball side  Leg extensions 15# 2x10 HS curls 25# 2x10 Resisted gait with step up x5 each side 30#    02/10/24 Recheck goals  Elliptical L1x2 mins  NuStep L5 x84mins  Resisted side steps over obstacles 30# x5 each side  On airex catch and throw at the same time with tennis balls- difficulty catching with L hand  Step up 6" to SLS x5 each side Leg press 20# 2x10  02/04/24 Redo BERG 54/56 Walking on grass  Walking big lap around back building NuStep L5 x37mins Treadmill pushes 30s x2 Walking on beam  Standing on 2 dyna disc  Step ups on BOSU x5 each side Side steps on BOSU   02/03/24 Bike L3 x83mins  Resisted gait 30# 3 way x3  Step ups 6"  Walking with ball toss and direction changes  Step ups on airex- minA  Side steps on airex- minA  Tandem walking  On airex shoulder ext 2x10  STS on airex with ball toss 2x10  01/28/24 NuStep L5x38mins Walking with eyes focused on target forwards and backwards Head turns and nods while looking at a target and walking Lateral steps on BOSU in bars- unsteady and needs UE support  Balance on BOSU Mini squat on BOSU- min-modA  On BOSU ball toss - modA    01/27/24 Standing VOR horizontal 2x60 seconds Standing VOR vertical 2x60 seconds- still more nauseous with cervical flexion/extension Standing marches on blue  foam pad cues to stay in middle of pad x20  Standing on blue foam pad + cross midline reaching to target progressive difficulty (cues to find target with eyes/head before reaching for it) Standing on blue foam pad + cross midline toe taps to targets (cues to find target with eyes/head before tapping) SLS with one foot on soft surface of BOSU/one foot on floor 3x30 seconds B  Alternating toe taps to BOSU from solid surface x20     PATIENT EDUCATION: Education details: Diagnosis, Prognosis, POC, holding off on HEP Person educated: Patient and Spouse Education method: Explanation, Demonstration, Tactile cues, and Verbal cues Education comprehension: verbalized understanding, returned demonstration, verbal cues required, tactile cues required, and needs further education  HOME EXERCISE PROGRAM:  Access Code:  [email or web address] + up and walking 5-10 minutes every hour awake  URL: [email or web address] Date: 01/20/2024 Prepared by: Nedra Hai  Exercises - Tandem Stance in Corner  - 1-2 x daily - 7 x weekly - 1 sets - 3 reps - 30 seconds  hold - Seated Gaze Stabilization with Head Rotation  - 1-2 x daily - 7 x weekly - 1 sets - 2 reps - 60 seconds  hold   GOALS: Goals reviewed with patient? No  SHORT TERM GOALS: Target date: 01/25/24  Initial HEP will be prescribed by 3rd visit Baseline: not provided  Goal status: MET 01/20/24  2.  Headache pain will not increase above 7/10 during session.  Baseline: started at 7/10, pt verbalized it was worse at end of eval  Goal status: ongoing 01/21/24, MET 01/28/24  3.  Activity increase will not elicit vomiting. Baseline: vomited at end of eval after BERG testing. Goal status: ongoing 01/21/24, doing harder tasks in sessions no vomiting in 3 sessions 01/28/24, progressing 02/10/24   LONG TERM GOALS: Target date: 03/09/24  Pt will be independent with advanced HEP, as prescribed throughout POC. Baseline: not prescribed yet Goal status:  ongoing 02/20/24  2.  Pt will decrease HA pain to </= 4/10 with activity.  Baseline: > 7/10 Goal status: progressing 02/10/24  3.  Pt will increase BERG balance test score to >/= 52/56. Baseline: 48/56 Goal status: MET 54/56 02/04/24  4.  Pt will complete full session without vomiting or any other adverse reactions.  Baseline: vomited after eval Goal status: progressing 2 sessions back to back w/vomit, 2 sessions without, MET 02/10/24  5. Pt will complete SLS and tandem on firm and foam without LOB >10s   Baseline: 5s  Goal status: Iprogressing 02/20/24    ASSESSMENT:  CLINICAL IMPRESSION: Pt returned today for physical therapy intervention to address recovery from cerebral hemorrhage.  He tolerated more advanced neuromuscular / balance activities today.  Noted less control with righting reaction, forward motion so addressed more eccentric lowering, forward reaching at end of this session.  He tolerated everything well.  OBJECTIVE IMPAIRMENTS: decreased activity tolerance, decreased balance, difficulty walking, decreased safety awareness, impaired perceived functional ability, increased muscle spasms, and pain.   ACTIVITY LIMITATIONS: standing, stairs, and locomotion level  PARTICIPATION LIMITATIONS: cleaning, laundry, driving, shopping, community activity, and occupation  PERSONAL FACTORS: 1 comorbidity: history of stroke  are also affecting patient's functional outcome.   REHAB POTENTIAL: Good  CLINICAL DECISION MAKING: Evolving/moderate complexity  EVALUATION COMPLEXITY: Moderate  PLAN:  PT FREQUENCY: 1-2x/week  PT DURATION: 8 weeks  PLANNED INTERVENTIONS: 97164- PT Re-evaluation, 97110-Therapeutic exercises, 97530- Therapeutic activity, 97112- Neuromuscular re-education, 97535- Self Care, 78295- Manual therapy, and 97116- Gait training  PLAN FOR NEXT SESSION: Continue to advance balance coordination and overall functional endurance  Franca Stakes, PT, DPT, OCS 02/24/24 4:13  PM

## 2024-02-25 ENCOUNTER — Ambulatory Visit: Admitting: Physical Therapy

## 2024-02-25 ENCOUNTER — Encounter: Payer: Self-pay | Admitting: Physical Therapy

## 2024-02-25 DIAGNOSIS — R2689 Other abnormalities of gait and mobility: Secondary | ICD-10-CM

## 2024-02-25 DIAGNOSIS — R278 Other lack of coordination: Secondary | ICD-10-CM

## 2024-02-25 DIAGNOSIS — R42 Dizziness and giddiness: Secondary | ICD-10-CM

## 2024-02-25 DIAGNOSIS — M6281 Muscle weakness (generalized): Secondary | ICD-10-CM | POA: Diagnosis not present

## 2024-02-25 DIAGNOSIS — R262 Difficulty in walking, not elsewhere classified: Secondary | ICD-10-CM

## 2024-02-25 NOTE — Therapy (Signed)
 OUTPATIENT PHYSICAL THERAPY NEURO TREATMENT    Patient Name: Wayne Lee MRN: [id number] DOB:25-Sep-1976, 48 y.o., male Today's Date: 02/25/2024   PCP: No PCP REFERRING PROVIDER: Delle Reining, PA-C  END OF SESSION:  PT End of Session - 02/25/24 1750     Visit Number 14    Date for PT Re-Evaluation 03/27/24    Authorization Type [employer]    PT Start Time 1742    PT Stop Time 1826    PT Time Calculation (min) 44 min    Activity Tolerance Patient tolerated treatment well    Behavior During Therapy WFL for tasks assessed/performed                       Past Medical History:  Diagnosis Date   Atopic dermatitis in adult    Left cataract    Obesity (BMI 30-39.9)    Stroke [facility])    Undescended left testicle    had surgical intervention   Past Surgical History:  Procedure Laterality Date   CATARACT EXTRACTION W/ INTRAOCULAR LENS IMPLANT Left 2017   CHOLECYSTECTOMY     ORCHIOPEXY  1984   SUBOCCIPITAL CRANIECTOMY CERVICAL LAMINECTOMY N/A 12/16/2023   Procedure: SUBOCCIPITAL CRANIECTOMY;  Surgeon: Barnett Abu, MD;  Location: [facility] OR;  Service: Neurosurgery;  Laterality: N/A;   Patient Active Problem List   Diagnosis Date Noted   Chronic fatigue 02/12/2024   Nausea and vomiting 01/13/2024   Central nervous system origin vertigo, unspecified laterality 01/13/2024   Impaired mobility and ADLs 01/13/2024   Bilateral headaches 12/31/2023   ICH (intracerebral hemorrhage) (HCC) 12/20/2023   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   Hypertension 12/19/2023   Brain compression (HCC) 12/19/2023   Nontraumatic acute cerebral hemorrhage (HCC) 12/16/2023   Cerebellar hemorrhage (HCC) 12/16/2023    ONSET DATE: 12/15/22  REFERRING DIAG:  Diagnosis  I61.9 (ICD-10-CM) - Nontraumatic acute cerebral hemorrhage (HCC)    THERAPY DIAG:  Muscle weakness (generalized)  Other lack of coordination  Other abnormalities of gait and  mobility  Dizziness and giddiness  Difficulty in walking, not elsewhere classified  Rationale for Evaluation and Treatment: Rehabilitation  SUBJECTIVE:                                                                                                                                                                                             SUBJECTIVE STATEMENT: Worked today, had mental fatigue , some dizziness Pt accompanied by: self  PERTINENT HISTORY: 1/6 stroke, ICU and IRF leaving hospital 1/21  PAIN:  Are you having pain? No 0/10  PRECAUTIONS: None  RED FLAGS: None  WEIGHT BEARING RESTRICTIONS: No  FALLS: Has patient fallen in last 6 months? No  LIVING ENVIRONMENT: Lives with: lives with their family and lives with their spouse Lives in: House/apartment Stairs: Yes: Internal: 15 steps; can reach both and External: 2 steps; none front door 4 steps rails on both sides Has following equipment at home: None  PLOF:  supervision for I/ADLs  PATIENT GOALS: walking, energy, endurance  OBJECTIVE:  Note: Objective measures were completed at Evaluation unless otherwise noted.  DIAGNOSTIC FINDINGS:  Nothing new  COGNITION: Overall cognitive status: Within functional limits for tasks assessed   SENSATION: WFL  COORDINATION: Toe taps and heel-to-shin normal  EDEMA:  None  MUSCLE TONE: RLE: Within functional limits  MUSCLE LENGTH: NT  POSTURE:  very wide BOS   LOWER EXTREMITY ROM:   WFL   Active  Right Eval Left Eval  Hip flexion    Hip extension    Hip abduction    Hip adduction    Hip internal rotation    Hip external rotation    Knee flexion    Knee extension    Ankle dorsiflexion    Ankle plantarflexion    Ankle inversion    Ankle eversion     (Blank rows = not tested)  LOWER EXTREMITY MMT:    MMT Right Eval Left Eval  Hip flexion 5/5 5/5  Hip extension    Hip abduction 5/5 5/5  Hip adduction 5/5 5/5  Hip internal rotation    Hip  external rotation    Knee flexion 5/5 5/5  Knee extension 5/5 5/5  Ankle dorsiflexion    Ankle plantarflexion    Ankle inversion    Ankle eversion    (Blank rows = not tested)  BED MOBILITY:  Sit to supine Complete Independence and Modified independence Supine to sit Modified independence Rolling to Right Complete Independence Rolling to Left Complete Independence  TRANSFERS: Assistive device utilized: None  Sit to stand: Modified independence Stand to sit: Complete Independence Chair to chair: Modified independence Floor:  NT   STAIRS: Level of Assistance:  NT Stair Negotiation Technique:  with  Number of Stairs:   Height of Stairs:   Comments: NT  GAIT: Gait pattern: wide BOS, R lateral lean using wall to balance on the way back to room Distance walked: 50 ft Assistive device utilized: None Level of assistance: Min A with HHA from wife (pt did not want to, but was leaning) Comments: could potentially use AD  FUNCTIONAL TESTS:  5 times sit to stand: 9.26 seconds Berg Balance Scale: 48/56  PATIENT SURVEYS:  NT today                                                                                                                              TREATMENT DATE: 02/25/24 Nustep level 5 x 5 minutes keeping above 70 SPM Walking outside around building good pace, no rest, some ball tosssing [employer] On rocker board  2 ways On bosu reaching for numbers On bosu ball toss On airex 15# extension On airex 10# chest press Side step on and off airex and then with ball toss Skipping Side shuffling  02/24/24:  Gait outdoors on asphalt and sidewalk, curb, for 8 min, occasional weaving, self corrects Bosu on smooth side , throwing and catching a ball Bosu on smooth side in ll bars, reaching forward to targets, then facing side ways and reaching with same hand and across body to opposite side Resisted gait 5 x each direction 50#, lost balance more with walking backwards then  change to controlling forward pace RDL's single leg, to elevated hi/low mat, 5 x each leg In ll bars for forward toe taps to cone, from 8" step In ll bars for vertical jumps x 30 sec Side weaving, over/ under, x 20' x 2 each direction, walking slowly  02/20/24 Gait outside around the back building good pace, a few times veered to the left, but never a LOB Resisted gait all directions Patient holding me back with walking using tband Walking ball toss On bosu balance and then reaching On rockerboard balance and reaching On bosu catch On airex 15# straight arm pulls and chest press Leg press 40# 2x10 35# lats 2x10 15# 2x10 35# HS curls On airex cone toe touches, needed CGA due to LOB on this  02/13/24 Treadmill walk Seated row 35# 2x10 Lat pull down 35# 2x10 Obstacle course over obstacles, around cones, on airex, across beam, on 6" step min-modA  Catch on BOSU, both sides- harder on ball side  Leg extensions 15# 2x10 HS curls 25# 2x10 Resisted gait with step up x5 each side 30#    02/10/24 Recheck goals  Elliptical L1x2 mins  NuStep L5 x3mins  Resisted side steps over obstacles 30# x5 each side  On airex catch and throw at the same time with tennis balls- difficulty catching with L hand  Step up 6" to SLS x5 each side Leg press 20# 2x10  02/04/24 Redo BERG 54/56 Walking on grass  Walking big lap around back building NuStep L5 x43mins Treadmill pushes 30s x2 Walking on beam  Standing on 2 dyna disc  Step ups on BOSU x5 each side Side steps on BOSU   02/03/24 Bike L3 x26mins  Resisted gait 30# 3 way x3  Step ups 6"  Walking with ball toss and direction changes  Step ups on airex- minA  Side steps on airex- minA  Tandem walking  On airex shoulder ext 2x10  STS on airex with ball toss 2x10  01/28/24 NuStep L5x30mins Walking with eyes focused on target forwards and backwards Head turns and nods while looking at a target and walking Lateral steps on BOSU in bars-  unsteady and needs UE support  Balance on BOSU Mini squat on BOSU- min-modA  On BOSU ball toss - modA    01/27/24 Standing VOR horizontal 2x60 seconds Standing VOR vertical 2x60 seconds- still more nauseous with cervical flexion/extension Standing marches on blue foam pad cues to stay in middle of pad x20  Standing on blue foam pad + cross midline reaching to target progressive difficulty (cues to find target with eyes/head before reaching for it) Standing on blue foam pad + cross midline toe taps to targets (cues to find target with eyes/head before tapping) SLS with one foot on soft surface of BOSU/one foot on floor 3x30 seconds B  Alternating toe taps to BOSU from solid surface x20  PATIENT EDUCATION: Education details: Diagnosis, Prognosis, POC, holding off on HEP Person educated: Patient and Spouse Education method: Explanation, Demonstration, Tactile cues, and Verbal cues Education comprehension: verbalized understanding, returned demonstration, verbal cues required, tactile cues required, and needs further education  HOME EXERCISE PROGRAM:  Access Code: [email or web address] + up and walking 5-10 minutes every hour awake  URL: [email or web address] Date: 01/20/2024 Prepared by: Nedra Hai  Exercises - Tandem Stance in Corner  - 1-2 x daily - 7 x weekly - 1 sets - 3 reps - 30 seconds  hold - Seated Gaze Stabilization with Head Rotation  - 1-2 x daily - 7 x weekly - 1 sets - 2 reps - 60 seconds  hold   GOALS: Goals reviewed with patient? No  SHORT TERM GOALS: Target date: 01/25/24  Initial HEP will be prescribed by 3rd visit Baseline: not provided  Goal status: MET 01/20/24  2.  Headache pain will not increase above 7/10 during session.  Baseline: started at 7/10, pt verbalized it was worse at end of eval  Goal status: ongoing 01/21/24, MET 01/28/24  3.  Activity increase will not elicit vomiting. Baseline: vomited at end of eval after BERG testing. Goal  status: ongoing 01/21/24, doing harder tasks in sessions no vomiting in 3 sessions 01/28/24, progressing 02/10/24   LONG TERM GOALS: Target date: 03/09/24  Pt will be independent with advanced HEP, as prescribed throughout POC. Baseline: not prescribed yet Goal status: ongoing 02/20/24  2.  Pt will decrease HA pain to </= 4/10 with activity.  Baseline: > 7/10 Goal status: progressing 02/25/24  3.  Pt will increase BERG balance test score to >/= 52/56. Baseline: 48/56 Goal status: MET 54/56 02/04/24  4.  Pt will complete full session without vomiting or any other adverse reactions.  Baseline: vomited after eval Goal status: progressing 2 sessions back to back w/vomit, 2 sessions without, MET 02/10/24  5. Pt will complete SLS and tandem on firm and foam without LOB >10s   Baseline: 5s  Goal status: Iprogressing 02/20/24    ASSESSMENT:  CLINICAL IMPRESSION: Pt returned today for physical therapy intervention to address recovery from cerebral hemorrhage.  He tolerated more advanced neuromuscular / balance activities today. We continue to increase the intensity of the exercise as well as the higher level balance activity.  He is doing great but still struggles with the dynamic surfaces and really did have difficulty with the skipping and side shuffling coordination today OBJECTIVE IMPAIRMENTS: decreased activity tolerance, decreased balance, difficulty walking, decreased safety awareness, impaired perceived functional ability, increased muscle spasms, and pain.   ACTIVITY LIMITATIONS: standing, stairs, and locomotion level  PARTICIPATION LIMITATIONS: cleaning, laundry, driving, shopping, community activity, and occupation  PERSONAL FACTORS: 1 comorbidity: history of stroke  are also affecting patient's functional outcome.   REHAB POTENTIAL: Good  CLINICAL DECISION MAKING: Evolving/moderate complexity  EVALUATION COMPLEXITY: Moderate  PLAN:  PT FREQUENCY: 1-2x/week  PT DURATION: 8  weeks  PLANNED INTERVENTIONS: 97164- PT Re-evaluation, 97110-Therapeutic exercises, 97530- Therapeutic activity, [id number]- Neuromuscular re-education, 97535- Self Care, [id number]- Manual therapy, and 97116- Gait training  PLAN FOR NEXT SESSION: Continue to advance balance coordination and overall functional endurance Stacie Glaze, PT 02/25/24 5:51 PM

## 2024-02-26 ENCOUNTER — Ambulatory Visit: Payer: 59 | Admitting: Occupational Therapy

## 2024-03-03 ENCOUNTER — Ambulatory Visit: Payer: 59 | Admitting: Occupational Therapy

## 2024-03-03 DIAGNOSIS — R278 Other lack of coordination: Secondary | ICD-10-CM

## 2024-03-03 DIAGNOSIS — M6281 Muscle weakness (generalized): Secondary | ICD-10-CM | POA: Diagnosis not present

## 2024-03-03 DIAGNOSIS — R41842 Visuospatial deficit: Secondary | ICD-10-CM

## 2024-03-03 NOTE — Therapy (Signed)
 OUTPATIENT OCCUPATIONAL THERAPY NEURO Treatment  Patient Name: Wayne Lee MRN: [id number] DOB:Nov 17, 1976, 48 y.o., male Today's Date: 03/03/2024  PCP: none REFERRING PROVIDER: Dr. Shearon Stalls  END OF SESSION:  OT End of Session - 03/03/24 1535     Visit Number 8    Number of Visits 25    Date for OT Re-Evaluation 03/31/24    Authorization Type [employer]    Authorization Time Period 12 weeks    OT Start Time 1533    OT Stop Time 1615    OT Time Calculation (min) 42 min    Activity Tolerance Patient tolerated treatment well    Behavior During Therapy WFL for tasks assessed/performed                   Past Medical History:  Diagnosis Date   Atopic dermatitis in adult    Left cataract    Obesity (BMI 30-39.9)    Stroke [facility])    Undescended left testicle    had surgical intervention   Past Surgical History:  Procedure Laterality Date   CATARACT EXTRACTION W/ INTRAOCULAR LENS IMPLANT Left 2017   CHOLECYSTECTOMY     ORCHIOPEXY  1984   SUBOCCIPITAL CRANIECTOMY CERVICAL LAMINECTOMY N/A 12/16/2023   Procedure: SUBOCCIPITAL CRANIECTOMY;  Surgeon: Barnett Abu, MD;  Location: [facility] OR;  Service: Neurosurgery;  Laterality: N/A;   Patient Active Problem List   Diagnosis Date Noted   Chronic fatigue 02/12/2024   Nausea and vomiting 01/13/2024   Central nervous system origin vertigo, unspecified laterality 01/13/2024   Impaired mobility and ADLs 01/13/2024   Bilateral headaches 12/31/2023   ICH (intracerebral hemorrhage) (HCC) 12/20/2023   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   Hypertension 12/19/2023   Brain compression (HCC) 12/19/2023   Nontraumatic acute cerebral hemorrhage (HCC) 12/16/2023   Cerebellar hemorrhage (HCC) 12/16/2023    ONSET DATE: 12/16/23  REFERRING DIAG: I61.9 (ICD-10-CM) - Nontraumatic acute cerebral hemorrhage (HCC)   THERAPY DIAG:  Muscle weakness (generalized)  Other lack of coordination  Visuospatial deficit  Rationale  for Evaluation and Treatment: Rehabilitation  SUBJECTIVE:   SUBJECTIVE STATEMENT: Pt reports he is doing well at work Pt accompanied by: significant other  PERTINENT HISTORY: 48 yo male arrival 1/6 with HTN 204/106 taken to emergent OR hematoma evacuation s/p suboccipital craniotomy and hypertonic saline started. Extubated 1/7 PMH obesity, sleep apnea, HLD d/c home 1/21/5 Per pt's wife AVM  CT Head without contrast: Large acute 5.3 cm intraparenchymal hemorrhage in the left cerebellum. Mass effect with narrowed fourth ventricle, but no hydrocephalus at this time. Basal cisterns are effaced and there is early ascending transtentorial herniation. PRECAUTIONS: Fall  WEIGHT BEARING RESTRICTIONS: No  PAIN: no pain today FALLS: Has patient fallen in last 6 months? No  LIVING ENVIRONMENT: Lives with: lives with their family and lives with their spouse Lives in: House/apartment Stairs: yes  Has following equipment at home: None  PLOF: Independent  PATIENT GOALS: return to prior level  OBJECTIVE:  Note: Objective measures were completed at Evaluation unless otherwise noted.  HAND DOMINANCE: Right  ADLs: Overall ADLs: mod I-supervision with basic ADLS Transfers/ambulation related to ADLs: Eating: mod I Grooming: mod I UB Dressing: mod I LB Dressing: mod I Toileting: mod I Bathing: supervision, sits in bottom of bathtub Tub Shower transfers: supervision   IADLs:dependent for IADLS Medication management: wife is Teacher, English as a foreign language: dependent   MOBILITY STATUS:  mod I-supervision    ACTIVITY TOLERANCE: Activity tolerance: limited by nausea   UPPER EXTREMITY  ROM:  WFLS   UPPER EXTREMITY MMT:     MMT Right eval Left eval  Shoulder flexion 4+/5 4/5  Shoulder abduction    Shoulder adduction    Shoulder extension    Shoulder internal rotation    Shoulder external rotation    Middle trapezius    Lower trapezius    Elbow flexion 4+/5 4+/5  Elbow  extension 4+/5 4/5  Wrist flexion    Wrist extension    Wrist ulnar deviation    Wrist radial deviation    Wrist pronation    Wrist supination    (Blank rows = not tested)  HAND FUNCTION: Grip strength: Right: 63 lbs; Left: 48 lbs  COORDINATION: 9 Hole Peg test: Right: 26.25 sec; Left: 32.80 with several drops sec  SENSATION: WFL  COGNITION: Overall cognitive status:  recalls 3/3 words following short delay, spells WORLD backwards without difficulty  VISION: Subjective report: Pt reports double vision at times   VISION ASSESSMENT: To be further assessed in functional context Pt reports diplopia at times He was able to track to all 4 quadrants and visual fiels were intact. He became nauseous and vomited shortly after vision testing. Pt appears to have some vestibular issues.  Patient has difficulty with following activities due to following visual impairments: reading    OBSERVATIONS: Pleasant agreeable male, accompanied by his wife and 1 y.o son Pt's BP was elevated at end of session when he was vomiting 140/102, and 148/115, pt was instructed to monitor at home and to contact MD if diastolic BP does not reduce to below 100. pt's wife verbalized understanding                                                                                                                             TREATMENT DATE: 03/03/24- environmental scanning 14/14 items located in sequential order with mild dizziness tabletop scanning task 77/80 items located mild diplopia. Pt reports work activities are going well. Pt was encouraged to continue to take more frequent rest and brain breaks to avoid over fatigue. Pt cooked shrimp this weekend without issues and he cared for his 2 y.o son.  Reviewed green theraband HEP. 15 rpes each, min v.c  02/19/24- Organizing your day task for attention to detail, and reading tolerance in prep for return to work . Pt has mod difficulty with task. He initally omitted 2  items and he did not account for the tempeture when buying groceries. He alos missed the detail requiring him to register for class at a specific time. Activity took pt the entire session and he reuired v.c to correct errors. Pt reported fatigue with task at end of session. Therapist discsussed implications with similar activities with return to work and discussed memory strategies and reinforced improtance of writing things down rather than trying to remember. Pt returns to work next week.  3/03/25Reading activity to read larger print items on the I pad then smaller article on  the computer. Pt reports it is not blurry but he has some visual fatigue. Pt was instructed in theraband exercises: green band for shoulder abduction, then blue for remaining exercises- see pt instructions, 15 reps each, min v.c for positioning. Pt was isntructed that he can try blue band for shoulder abduction, however he should stop if he has any increased pain.  02/05/24- constant therapy alternating symbols 88% accuracy, 42.62 secs response time, for alternating attention with visual component Ambulating while tossing ball and performing category generation, only 2-3 cues for entire alphabet. Copying small peg design with LUE for increased fine motor coordiantion with a visual and cognitive component, min difficulty/ v.c.  Removing with in hand manipulation, min-mod drops. UBE x 6 mins level 3 for conditioning and reciprocal movement   01/30/24- Constant therapy:Put steps in order( for reading activity), 100% accuracy, no increased dizziness.  Followed by reading a map 80% acuracy, 35.52 response time, due to pt moving quickly and mistakenly hitting one itme and one error due to not double checking. Reading accuracy 80% accuracy. Grooved pegs for increased fine motor coordination, min difficulty/ drops, min v.c Environmental scanning to locate items 1-9 in squential order, pt with good success. Amb while tossing ball and  naming animals for every letter of alphabet, min questionsing cues.  01/22/24- see pt. education Copying small peg design with LUE for fine motor coordination with a visual componant, min difficulty/ drops for coordination. Pt copied design correctly without v.c Number cancellation 7M with 100% accuracy and no reports of dizziness nausea    01/15/24-coordiantion HEP issued, see education  01/07/24 eval        PATIENT EDUCATION: Education details: green theraband  Person educated: Patient and Spouse Education method: Explanation, demonstration, v.c , Education comprehension: verbalized understanding, returned demonstration,   HOME EXERCISE PROGRAM: coordination, putty   GOALS: Goals reviewed with patient? Yes  SHORT TERM GOALS: Target date: 02/06/24  I with HEP  Goal status: met, 01/22/24- demonstrates understanding of putty exercises  2.  Pt will demonstrate improved fine motor coordination as evidenced by decreasing 9 hole peg test LUE to 29 secs or less without several drops.  Goal status:met, 27.07 secs  3.  Pt will increase LUE grip strength to 52 lbs or greater for increased ease with daily activities.  Goal status: met, 63 lbs  4.  Pt will perfrom tabletop scanning without diplopia or vomiting with 90% or better accuracy.  Goal status: ongoing, perfroms reading without diplopia however visual fatigue. 02/19/24  5.  Pt will perfrom transitional movements for ADLS/ IADLs without LOB or nausea greater than 3/10.  Goal status: ongoing 02/19/24  6. I with adapted strategies for ADLs/IADLs to improve safety and I.   Goal status: ongoing 02/19/24  LONG TERM GOALS: Target date: 03/31/24  I with updated HEP  Goal status: INITIAL  2.  Pt will perform mod complex home management mod I  Goal status: ongoing  3.  Pt will perform environmental scanning in a busy environment with 90% or better accuracy, without vomiting  Goal status: ongoing, performed on  02/04/24  4.  Pt will perfrom simulated work activities mod I  Goal status: ongoing perfromed last week    ASSESSMENT:  CLINICAL IMPRESSION: Pt is progressing towards goals. He demonstrated difficulty with a novel organization task today. Therapist discussed recommendations for returning to work and importance of organization/ memory strategies.PERFORMANCE DEFICITS: in functional skills including ADLs, IADLs, coordination, dexterity, ROM, strength, Fine motor control, Gross motor control,  balance, endurance, decreased knowledge of precautions, decreased knowledge of use of DME, vision, UE functional use, and vestibular, cognitive skills including safety awareness, and psychosocial skills including coping strategies, environmental adaptation, habits, interpersonal interactions, and routines and behaviors.   IMPAIRMENTS: are limiting patient from ADLs, IADLs, rest and sleep, work, play, leisure, and social participation.   CO-MORBIDITIES: may have co-morbidities  that affects occupational performance. Patient will benefit from skilled OT to address above impairments and improve overall function.  MODIFICATION OR ASSISTANCE TO COMPLETE EVALUATION: Min-Moderate modification of tasks or assist with assess necessary to complete an evaluation.  OT OCCUPATIONAL PROFILE AND HISTORY: Detailed assessment: Review of records and additional review of physical, cognitive, psychosocial history related to current functional performance.  CLINICAL DECISION MAKING: LOW - limited treatment options, no task modification necessary  REHAB POTENTIAL: Good  EVALUATION COMPLEXITY: Low    PLAN:  OT FREQUENCY: 2x/week plus eval  OT DURATION: 12 weeks  PLANNED INTERVENTIONS: 97168 OT Re-evaluation, 97535 self care/ADL training, 08657 therapeutic exercise, 97530 therapeutic activity, 97112 neuromuscular re-education, 97140 manual therapy, 97116 gait training, 84696 aquatic therapy, 97035 ultrasound, 97018  paraffin, 29528 moist heat, 97010 cryotherapy, 97034 contrast bath, balance training, functional mobility training, visual/perceptual remediation/compensation, energy conservation, coping strategies training, patient/family education, and DME and/or AE instructions  RECOMMENDED OTHER SERVICES: PT  CONSULTED AND AGREED WITH PLAN OF CARE: Patient  PLAN FOR NEXT SESSION:   return to work  recommendations, organizing your day/  Kaveri Perras, OT 03/03/2024, 3:37 PM                   Weslynn Ke, OT 03/03/2024, 3:37 PM

## 2024-03-04 ENCOUNTER — Ambulatory Visit: Admitting: Physical Therapy

## 2024-03-04 ENCOUNTER — Ambulatory Visit: Payer: 59 | Admitting: Occupational Therapy

## 2024-03-04 ENCOUNTER — Encounter: Payer: Self-pay | Admitting: Physical Therapy

## 2024-03-04 DIAGNOSIS — M6281 Muscle weakness (generalized): Secondary | ICD-10-CM

## 2024-03-04 DIAGNOSIS — R278 Other lack of coordination: Secondary | ICD-10-CM

## 2024-03-04 NOTE — Therapy (Signed)
 OUTPATIENT PHYSICAL THERAPY NEURO TREATMENT    Patient Name: Wayne Lee MRN: [id number] DOB:July 24, 1976, 48 y.o., male Today's Date: 03/04/2024   PCP: No PCP REFERRING PROVIDER: Delle Reining, PA-C  END OF SESSION:  PT End of Session - 03/04/24 1521     Visit Number 15    Date for PT Re-Evaluation 03/27/24    Authorization Type [employer]    PT Start Time 1521   pt few minutes late   PT Stop Time 1558    PT Time Calculation (min) 37 min    Activity Tolerance Patient tolerated treatment well    Behavior During Therapy WFL for tasks assessed/performed                        Past Medical History:  Diagnosis Date   Atopic dermatitis in adult    Left cataract    Obesity (BMI 30-39.9)    Stroke [facility])    Undescended left testicle    had surgical intervention   Past Surgical History:  Procedure Laterality Date   CATARACT EXTRACTION W/ INTRAOCULAR LENS IMPLANT Left 2017   CHOLECYSTECTOMY     ORCHIOPEXY  1984   SUBOCCIPITAL CRANIECTOMY CERVICAL LAMINECTOMY N/A 12/16/2023   Procedure: SUBOCCIPITAL CRANIECTOMY;  Surgeon: Barnett Abu, MD;  Location: [facility] OR;  Service: Neurosurgery;  Laterality: N/A;   Patient Active Problem List   Diagnosis Date Noted   Chronic fatigue 02/12/2024   Nausea and vomiting 01/13/2024   Central nervous system origin vertigo, unspecified laterality 01/13/2024   Impaired mobility and ADLs 01/13/2024   Bilateral headaches 12/31/2023   ICH (intracerebral hemorrhage) (HCC) 12/20/2023   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   Hypertension 12/19/2023   Brain compression (HCC) 12/19/2023   Nontraumatic acute cerebral hemorrhage (HCC) 12/16/2023   Cerebellar hemorrhage (HCC) 12/16/2023    ONSET DATE: 12/15/22  REFERRING DIAG:  Diagnosis  I61.9 (ICD-10-CM) - Nontraumatic acute cerebral hemorrhage (HCC)    THERAPY DIAG:  Muscle weakness (generalized)  Other lack of coordination  Rationale for Evaluation  and Treatment: Rehabilitation  SUBJECTIVE:                                                                                                                                                                                             SUBJECTIVE STATEMENT:  Doing good, feeling a lot better.  Would put myself at 70% right now with last big issues being balance and coordination.     Pt accompanied by: self  PERTINENT HISTORY: 1/6 stroke, ICU and IRF leaving hospital 1/21  PAIN:  Are you having pain? No 0/10  but a little dizzy   PRECAUTIONS: None  RED FLAGS: None   WEIGHT BEARING RESTRICTIONS: No  FALLS: Has patient fallen in last 6 months? No  LIVING ENVIRONMENT: Lives with: lives with their family and lives with their spouse Lives in: House/apartment Stairs: Yes: Internal: 15 steps; can reach both and External: 2 steps; none front door 4 steps rails on both sides Has following equipment at home: None  PLOF:  supervision for I/ADLs  PATIENT GOALS: walking, energy, endurance  OBJECTIVE:  Note: Objective measures were completed at Evaluation unless otherwise noted.  DIAGNOSTIC FINDINGS:  Nothing new  COGNITION: Overall cognitive status: Within functional limits for tasks assessed   SENSATION: WFL  COORDINATION: Toe taps and heel-to-shin normal  EDEMA:  None  MUSCLE TONE: RLE: Within functional limits  MUSCLE LENGTH: NT  POSTURE:  very wide BOS   LOWER EXTREMITY ROM:   WFL   Active  Right Eval Left Eval  Hip flexion    Hip extension    Hip abduction    Hip adduction    Hip internal rotation    Hip external rotation    Knee flexion    Knee extension    Ankle dorsiflexion    Ankle plantarflexion    Ankle inversion    Ankle eversion     (Blank rows = not tested)  LOWER EXTREMITY MMT:    MMT Right Eval Left Eval  Hip flexion 5/5 5/5  Hip extension    Hip abduction 5/5 5/5  Hip adduction 5/5 5/5  Hip internal rotation    Hip external  rotation    Knee flexion 5/5 5/5  Knee extension 5/5 5/5  Ankle dorsiflexion    Ankle plantarflexion    Ankle inversion    Ankle eversion    (Blank rows = not tested)  BED MOBILITY:  Sit to supine Complete Independence and Modified independence Supine to sit Modified independence Rolling to Right Complete Independence Rolling to Left Complete Independence  TRANSFERS: Assistive device utilized: None  Sit to stand: Modified independence Stand to sit: Complete Independence Chair to chair: Modified independence Floor:  NT   STAIRS: Level of Assistance:  NT Stair Negotiation Technique:  with  Number of Stairs:   Height of Stairs:   Comments: NT  GAIT: Gait pattern: wide BOS, R lateral lean using wall to balance on the way back to room Distance walked: 50 ft Assistive device utilized: None Level of assistance: Min A with HHA from wife (pt did not want to, but was leaning) Comments: could potentially use AD  FUNCTIONAL TESTS:  5 times sit to stand: 9.26 seconds Berg Balance Scale: 48/56  PATIENT SURVEYS:  NT today                                                                                                                              TREATMENT DATE:   03/04/24  Elliptical L6 x8 minutes for w/u and neural priming  Walking outside down/up hills and curbs with ball toss to self min guard Grape vines down and up hill Walking down hill forward/up backwards with ball toss to self Walking down backwards/up forwards with ball toss to self Sideways high knees + lateral step overs + BOSU kicks + backwards figure 8 walks (3 rounds) Standing on soft surface of BOSU 30 seconds + dribbling yellow weighted ball between cones + lateral alternating cone taps + step over BOSU 1 round     02/25/24 Nustep level 5 x 5 minutes keeping above 70 SPM Walking outside around building good pace, no rest, some ball [employer] kicking On rocker board 2 ways On bosu reaching for  numbers On bosu ball toss On airex 15# extension On airex 10# chest press Side step on and off airex and then with ball toss Skipping Side shuffling  02/24/24:  Gait outdoors on asphalt and sidewalk, curb, for 8 min, occasional weaving, self corrects Bosu on smooth side , throwing and catching a ball Bosu on smooth side in ll bars, reaching forward to targets, then facing side ways and reaching with same hand and across body to opposite side Resisted gait 5 x each direction 50#, lost balance more with walking backwards then change to controlling forward pace RDL's single leg, to elevated hi/low mat, 5 x each leg In ll bars for forward toe taps to cone, from 8" step In ll bars for vertical jumps x 30 sec Side weaving, over/ under, x 20' x 2 each direction, walking slowly  02/20/24 Gait outside around the back building good pace, a few times veered to the left, but never a LOB Resisted gait all directions Patient holding me back with walking using tband Walking ball toss On bosu balance and then reaching On rockerboard balance and reaching On bosu catch On airex 15# straight arm pulls and chest press Leg press 40# 2x10 35# lats 2x10 15# 2x10 35# HS curls On airex cone toe touches, needed CGA due to LOB on this  02/13/24 Treadmill walk Seated row 35# 2x10 Lat pull down 35# 2x10 Obstacle course over obstacles, around cones, on airex, across beam, on 6" step min-modA  Catch on BOSU, both sides- harder on ball side  Leg extensions 15# 2x10 HS curls 25# 2x10 Resisted gait with step up x5 each side 30#        PATIENT EDUCATION: Education details: Diagnosis, Prognosis, POC, holding off on HEP Person educated: Patient and Spouse Education method: Explanation, Demonstration, Tactile cues, and Verbal cues Education comprehension: verbalized understanding, returned demonstration, verbal cues required, tactile cues required, and needs further education  HOME EXERCISE  PROGRAM:  Access Code: [email or web address] + up and walking 5-10 minutes every hour awake  URL: [email or web address] Date: 01/20/2024 Prepared by: Nedra Hai  Exercises - Tandem Stance in Corner  - 1-2 x daily - 7 x weekly - 1 sets - 3 reps - 30 seconds  hold - Seated Gaze Stabilization with Head Rotation  - 1-2 x daily - 7 x weekly - 1 sets - 2 reps - 60 seconds  hold   GOALS: Goals reviewed with patient? No  SHORT TERM GOALS: Target date: 01/25/24  Initial HEP will be prescribed by 3rd visit Baseline: not provided  Goal status: MET 01/20/24  2.  Headache pain will not increase above 7/10 during session.  Baseline: started at 7/10, pt verbalized it was worse at end of eval  Goal status: ongoing 01/21/24, MET 01/28/24  3.  Activity  increase will not elicit vomiting. Baseline: vomited at end of eval after BERG testing. Goal status: ongoing 01/21/24, doing harder tasks in sessions no vomiting in 3 sessions 01/28/24, progressing 02/10/24   LONG TERM GOALS: Target date: 03/09/24  Pt will be independent with advanced HEP, as prescribed throughout POC. Baseline: not prescribed yet Goal status: ongoing 02/20/24  2.  Pt will decrease HA pain to </= 4/10 with activity.  Baseline: > 7/10 Goal status: progressing 02/25/24  3.  Pt will increase BERG balance test score to >/= 52/56. Baseline: 48/56 Goal status: MET 54/56 02/04/24  4.  Pt will complete full session without vomiting or any other adverse reactions.  Baseline: vomited after eval Goal status: progressing 2 sessions back to back w/vomit, 2 sessions without, MET 02/10/24  5. Pt will complete SLS and tandem on firm and foam without LOB >10s   Baseline: 5s  Goal status: Iprogressing 02/20/24    ASSESSMENT:  CLINICAL IMPRESSION:   Continued working on functional activity tolerance, balance, and coordination training today. He is really doing much better with his main remaining concerns being balance and coordination.  But  does have some wobble/incoordination with challenging tasks. May be getting close to being ready for DC to independent program, will continue to assess and progress/adapt.     OBJECTIVE IMPAIRMENTS: decreased activity tolerance, decreased balance, difficulty walking, decreased safety awareness, impaired perceived functional ability, increased muscle spasms, and pain.   ACTIVITY LIMITATIONS: standing, stairs, and locomotion level  PARTICIPATION LIMITATIONS: cleaning, laundry, driving, shopping, community activity, and occupation  PERSONAL FACTORS: 1 comorbidity: history of stroke  are also affecting patient's functional outcome.   REHAB POTENTIAL: Good  CLINICAL DECISION MAKING: Evolving/moderate complexity  EVALUATION COMPLEXITY: Moderate  PLAN:  PT FREQUENCY: 1-2x/week  PT DURATION: 8 weeks  PLANNED INTERVENTIONS: 97164- PT Re-evaluation, 97110-Therapeutic exercises, 97530- Therapeutic activity, [id number]- Neuromuscular re-education, 97535- Self Care, [id number]- Manual therapy, and 97116- Gait training  PLAN FOR NEXT SESSION: Continue to advance balance coordination and overall functional endurance as able, check LTGs    Nedra Hai, PT, DPT 03/04/24 3:59 PM

## 2024-03-09 ENCOUNTER — Ambulatory Visit

## 2024-03-09 ENCOUNTER — Ambulatory Visit: Admitting: Occupational Therapy

## 2024-03-09 ENCOUNTER — Inpatient Hospital Stay: Payer: Self-pay | Admitting: Neurology

## 2024-03-09 DIAGNOSIS — M6281 Muscle weakness (generalized): Secondary | ICD-10-CM | POA: Diagnosis not present

## 2024-03-09 DIAGNOSIS — R2689 Other abnormalities of gait and mobility: Secondary | ICD-10-CM

## 2024-03-09 DIAGNOSIS — R278 Other lack of coordination: Secondary | ICD-10-CM

## 2024-03-09 DIAGNOSIS — R41842 Visuospatial deficit: Secondary | ICD-10-CM

## 2024-03-09 NOTE — Therapy (Signed)
 OUTPATIENT OCCUPATIONAL THERAPY NEURO Treatment  Patient Name: Wayne Lee MRN: [id number] DOB:02-07-1976, 48 y.o., male Today's Date: 03/09/2024  PCP: none REFERRING PROVIDER: Dr. Shearon Stalls  END OF SESSION:  OT End of Session - 03/09/24 1534     Visit Number 9    Number of Visits 25    Date for OT Re-Evaluation 03/31/24    Authorization Type [employer]    Authorization Time Period 12 weeks    OT Start Time 1445    OT Stop Time 1530    OT Time Calculation (min) 45 min                    Past Medical History:  Diagnosis Date   Atopic dermatitis in adult    Left cataract    Obesity (BMI 30-39.9)    Stroke [facility])    Undescended left testicle    had surgical intervention   Past Surgical History:  Procedure Laterality Date   CATARACT EXTRACTION W/ INTRAOCULAR LENS IMPLANT Left 2017   CHOLECYSTECTOMY     ORCHIOPEXY  1984   SUBOCCIPITAL CRANIECTOMY CERVICAL LAMINECTOMY N/A 12/16/2023   Procedure: SUBOCCIPITAL CRANIECTOMY;  Surgeon: Barnett Abu, MD;  Location: [facility] OR;  Service: Neurosurgery;  Laterality: N/A;   Patient Active Problem List   Diagnosis Date Noted   Chronic fatigue 02/12/2024   Nausea and vomiting 01/13/2024   Central nervous system origin vertigo, unspecified laterality 01/13/2024   Impaired mobility and ADLs 01/13/2024   Bilateral headaches 12/31/2023   ICH (intracerebral hemorrhage) (HCC) 12/20/2023   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   Hypertension 12/19/2023   Brain compression (HCC) 12/19/2023   Nontraumatic acute cerebral hemorrhage (HCC) 12/16/2023   Cerebellar hemorrhage (HCC) 12/16/2023    ONSET DATE: 12/16/23  REFERRING DIAG: I61.9 (ICD-10-CM) - Nontraumatic acute cerebral hemorrhage (HCC)   THERAPY DIAG:  Muscle weakness (generalized)  Other lack of coordination  Visuospatial deficit  Other abnormalities of gait and mobility  Rationale for Evaluation and Treatment: Rehabilitation  SUBJECTIVE:    SUBJECTIVE STATEMENT: 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 Extubated 1/7 PMH obesity, sleep apnea, HLD d/c home 1/21/5 Per pt's wife AVM  CT Head without contrast: Large acute 5.3 cm intraparenchymal hemorrhage in the left cerebellum. Mass effect with narrowed fourth ventricle, but no hydrocephalus at this time. Basal cisterns are effaced and there is early ascending transtentorial herniation. PRECAUTIONS: Fall  WEIGHT BEARING RESTRICTIONS: No  PAIN: no pain today FALLS: Has patient fallen in last 6 months? No  LIVING ENVIRONMENT: Lives with: lives with their family and lives with their spouse Lives in: House/apartment Stairs: yes  Has following equipment at home: None  PLOF: Independent  PATIENT GOALS: return to prior level  OBJECTIVE:  Note: Objective measures were completed at Evaluation unless otherwise noted.  HAND DOMINANCE: Right  ADLs: Overall ADLs: mod I-supervision with basic ADLS Transfers/ambulation related to ADLs: Eating: mod I Grooming: mod I UB Dressing: mod I LB Dressing: mod I Toileting: mod I Bathing: supervision, sits in bottom of bathtub Tub Shower transfers: supervision   IADLs:dependent for IADLS Medication management: wife is Teacher, English as a foreign language: dependent   MOBILITY STATUS:  mod I-supervision    ACTIVITY TOLERANCE: Activity tolerance: limited by nausea   UPPER EXTREMITY ROM:  WFLS   UPPER EXTREMITY MMT:  MMT Right eval Left eval  Shoulder flexion 4+/5 4/5  Shoulder abduction    Shoulder adduction    Shoulder extension    Shoulder internal rotation    Shoulder external rotation    Middle trapezius    Lower trapezius    Elbow flexion 4+/5 4+/5  Elbow extension 4+/5 4/5  Wrist flexion    Wrist  extension    Wrist ulnar deviation    Wrist radial deviation    Wrist pronation    Wrist supination    (Blank rows = not tested)  HAND FUNCTION: Grip strength: Right: 63 lbs; Left: 48 lbs  COORDINATION: 9 Hole Peg test: Right: 26.25 sec; Left: 32.80 with several drops sec  SENSATION: WFL  COGNITION: Overall cognitive status:  recalls 3/3 words following short delay, spells WORLD backwards without difficulty  VISION: Subjective report: Pt reports double vision at times   VISION ASSESSMENT: To be further assessed in functional context Pt reports diplopia at times He was able to track to all 4 quadrants and visual fiels were intact. He became nauseous and vomited shortly after vision testing. Pt appears to have some vestibular issues.  Patient has difficulty with following activities due to following visual impairments: reading    OBSERVATIONS: Pleasant agreeable male, accompanied by his wife and 1 y.o son Pt's BP was elevated at end of session when he was vomiting 140/102, and 148/115, pt was instructed to monitor at home and to contact MD if diastolic BP does not reduce to below 100. pt's wife verbalized understanding                                                                                                                             TREATMENT DATE: 03/09/24- Bell's test; 32/34 located in 4 mins 37 secs. Pt located remaining 2 with min v.c  Organizing your day problem #1 for organization and attention to detail. Pt made 1 error. Pt scheduled himself to pick up a cake right before MD appointment, however he overlooked the fact that the cake is supposied to be an ice cream cake. Pt/ therapist discussed organization strategies he is using at work.  5/25- environmental scanning 14/14 items located in sequential order with mild dizziness tabletop scanning task 77/80 items located mild diplopia. Pt reports work activities are going well. Pt was encouraged to continue to  take more frequent rest and brain breaks to avoid over fatigue. Pt cooked shrimp this weekend without issues and he cared for his 2 y.o son.  Reviewed green theraband HEP. 15 rpes each, min v.c  02/19/24- Organizing your day task for attention to detail, and reading tolerance in prep for return to work . Pt has mod difficulty with task. He initally omitted 2 items and he did not account for the tempeture when buying groceries. He alos missed the detail requiring him to register for class at a specific time. Activity took pt the entire session and he reuired v.c  to correct errors. Pt reported fatigue with task at end of session. Therapist discsussed implications with similar activities with return to work and discussed memory strategies and reinforced improtance of writing things down rather than trying to remember. Pt returns to work next week.  3/03/25Reading activity to read larger print items on the I pad then smaller article on the computer. Pt reports it is not blurry but he has some visual fatigue. Pt was instructed in theraband exercises: green band for shoulder abduction, then blue for remaining exercises- see pt instructions, 15 reps each, min v.c for positioning. Pt was isntructed that he can try blue band for shoulder abduction, however he should stop if he has any increased pain.  02/05/24- constant therapy alternating symbols 88% accuracy, 42.62 secs response time, for alternating attention with visual component Ambulating while tossing ball and performing category generation, only 2-3 cues for entire alphabet. Copying small peg design with LUE for increased fine motor coordiantion with a visual and cognitive component, min difficulty/ v.c.  Removing with in hand manipulation, min-mod drops. UBE x 6 mins level 3 for conditioning and reciprocal movement   01/30/24- Constant therapy:Put steps in order( for reading activity), 100% accuracy, no increased dizziness.  Followed by reading a map  80% acuracy, 35.52 response time, due to pt moving quickly and mistakenly hitting one itme and one error due to not double checking. Reading accuracy 80% accuracy. Grooved pegs for increased fine motor coordination, min difficulty/ drops, min v.c Environmental scanning to locate items 1-9 in squential order, pt with good success. Amb while tossing ball and naming animals for every letter of alphabet, min questionsing cues.  01/22/24- see pt. education Copying small peg design with LUE for fine motor coordination with a visual componant, min difficulty/ drops for coordination. Pt copied design correctly without v.c Number cancellation 76M with 100% accuracy and no reports of dizziness nausea    01/15/24-coordiantion HEP issued, see education  01/07/24 eval        PATIENT EDUCATION: Education details: green theraband  Person educated: Patient and Spouse Education method: Explanation, demonstration, v.c , Education comprehension: verbalized understanding, returned demonstration,   HOME EXERCISE PROGRAM: coordination, putty   GOALS: Goals reviewed with patient? Yes  SHORT TERM GOALS: Target date: 02/06/24  I with HEP  Goal status: met, 01/22/24- demonstrates understanding of putty exercises  2.  Pt will demonstrate improved fine motor coordination as evidenced by decreasing 9 hole peg test LUE to 29 secs or less without several drops.  Goal status:met, 27.07 secs  3.  Pt will increase LUE grip strength to 52 lbs or greater for increased ease with daily activities.  Goal status: met, 63 lbs  4.  Pt will perfrom tabletop scanning without diplopia or vomiting with 90% or better accuracy.  Goal status: met 03/09/24, Bell's test 94% accuracy  5.  Pt will perfrom transitional movements for ADLS/ IADLs without LOB or nausea greater than 3/10.  Goal status: ongoing 02/19/24  6. I with adapted strategies for ADLs/IADLs to improve safety and I.   Goal status: ongoing  02/19/24  LONG TERM GOALS: Target date: 03/31/24  I with updated HEP  Goal status: INITIAL  2.  Pt will perform mod complex home management mod I  Goal status: ongoing  3.  Pt will perform environmental scanning in a busy environment with 90% or better accuracy, without vomiting  Goal status: ongoing, performed on 02/04/24  4.  Pt will perfrom simulated work activities mod I  Goal status: ongoing performed last week    ASSESSMENT:  CLINICAL IMPRESSION: Pt is progressing towards goals. Pt demonstrates improving tabletop scanning. Therapist discussed importance of attention to detail with pt during work activities.PERFORMANCE DEFICITS: in functional skills including ADLs, IADLs, coordination, dexterity, ROM, strength, Fine motor control, Gross motor control, balance, endurance, decreased knowledge of precautions, decreased knowledge of use of DME, vision, UE functional use, and vestibular, cognitive skills including safety awareness, and psychosocial skills including coping strategies, environmental adaptation, habits, interpersonal interactions, and routines and behaviors.   IMPAIRMENTS: are limiting patient from ADLs, IADLs, rest and sleep, work, play, leisure, and social participation.   CO-MORBIDITIES: may have co-morbidities  that affects occupational performance. Patient will benefit from skilled OT to address above impairments and improve overall function.  MODIFICATION OR ASSISTANCE TO COMPLETE EVALUATION: Min-Moderate modification of tasks or assist with assess necessary to complete an evaluation.  OT OCCUPATIONAL PROFILE AND HISTORY: Detailed assessment: Review of records and additional review of physical, cognitive, psychosocial history related to current functional performance.  CLINICAL DECISION MAKING: LOW - limited treatment options, no task modification necessary  REHAB POTENTIAL: Good  EVALUATION COMPLEXITY: Low    PLAN:  OT FREQUENCY: 2x/week plus eval  OT  DURATION: 12 weeks  PLANNED INTERVENTIONS: 97168 OT Re-evaluation, 97535 self care/ADL training, 14782 therapeutic exercise, 97530 therapeutic activity, 97112 neuromuscular re-education, 97140 manual therapy, 97116 gait training, 95621 aquatic therapy, 97035 ultrasound, 97018 paraffin, 30865 moist heat, 97010 cryotherapy, 97034 contrast bath, balance training, functional mobility training, visual/perceptual remediation/compensation, energy conservation, coping strategies training, patient/family education, and DME and/or AE instructions  RECOMMENDED OTHER SERVICES: PT  CONSULTED AND AGREED WITH PLAN OF CARE: Patient  PLAN FOR NEXT SESSION:  continue with strength and endurance and simulated work activities.  Marinell Igarashi, OT 03/09/2024, 3:37 PM                   Tanza Pellot, OT 03/09/2024, 3:37 PM

## 2024-03-09 NOTE — Therapy (Signed)
 OUTPATIENT PHYSICAL THERAPY NEURO TREATMENT    Patient Name: Wayne Lee MRN: [id number] DOB:September 29, 1976, 48 y.o., male Today's Date: 03/09/2024   PCP: No PCP REFERRING PROVIDER: Delle Reining, PA-C  END OF SESSION:  PT End of Session - 03/09/24 1401     Visit Number 16    Date for PT Re-Evaluation 03/27/24    Authorization Type [employer]    PT Start Time 1400    PT Stop Time 1445    PT Time Calculation (min) 45 min    Activity Tolerance Patient tolerated treatment well    Behavior During Therapy WFL for tasks assessed/performed                         Past Medical History:  Diagnosis Date   Atopic dermatitis in adult    Left cataract    Obesity (BMI 30-39.9)    Stroke [facility])    Undescended left testicle    had surgical intervention   Past Surgical History:  Procedure Laterality Date   CATARACT EXTRACTION W/ INTRAOCULAR LENS IMPLANT Left 2017   CHOLECYSTECTOMY     ORCHIOPEXY  1984   SUBOCCIPITAL CRANIECTOMY CERVICAL LAMINECTOMY N/A 12/16/2023   Procedure: SUBOCCIPITAL CRANIECTOMY;  Surgeon: Barnett Abu, MD;  Location: [facility] OR;  Service: Neurosurgery;  Laterality: N/A;   Patient Active Problem List   Diagnosis Date Noted   Chronic fatigue 02/12/2024   Nausea and vomiting 01/13/2024   Central nervous system origin vertigo, unspecified laterality 01/13/2024   Impaired mobility and ADLs 01/13/2024   Bilateral headaches 12/31/2023   ICH (intracerebral hemorrhage) (HCC) 12/20/2023   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   Hypertension 12/19/2023   Brain compression (HCC) 12/19/2023   Nontraumatic acute cerebral hemorrhage (HCC) 12/16/2023   Cerebellar hemorrhage (HCC) 12/16/2023    ONSET DATE: 12/15/22  REFERRING DIAG:  Diagnosis  I61.9 (ICD-10-CM) - Nontraumatic acute cerebral hemorrhage (HCC)    THERAPY DIAG:  Muscle weakness (generalized)  Other lack of coordination  Other abnormalities of gait and  mobility  Rationale for Evaluation and Treatment: Rehabilitation  SUBJECTIVE:                                                                                                                                                                                             SUBJECTIVE STATEMENT: I feel good. Last week work was better than the one before.    Pt accompanied by: self  PERTINENT HISTORY: 1/6 stroke, ICU and IRF leaving hospital 1/21  PAIN:  Are you having pain? No 0/10 but a little dizzy   PRECAUTIONS: None  RED FLAGS: None   WEIGHT BEARING RESTRICTIONS: No  FALLS: Has patient fallen in last 6 months? No  LIVING ENVIRONMENT: Lives with: lives with their family and lives with their spouse Lives in: House/apartment Stairs: Yes: Internal: 15 steps; can reach both and External: 2 steps; none front door 4 steps rails on both sides Has following equipment at home: None  PLOF:  supervision for I/ADLs  PATIENT GOALS: walking, energy, endurance  OBJECTIVE:  Note: Objective measures were completed at Evaluation unless otherwise noted.  DIAGNOSTIC FINDINGS:  Nothing new  COGNITION: Overall cognitive status: Within functional limits for tasks assessed   SENSATION: WFL  COORDINATION: Toe taps and heel-to-shin normal  EDEMA:  None  MUSCLE TONE: RLE: Within functional limits  MUSCLE LENGTH: NT  POSTURE:  very wide BOS   LOWER EXTREMITY ROM:   WFL   Active  Right Eval Left Eval  Hip flexion    Hip extension    Hip abduction    Hip adduction    Hip internal rotation    Hip external rotation    Knee flexion    Knee extension    Ankle dorsiflexion    Ankle plantarflexion    Ankle inversion    Ankle eversion     (Blank rows = not tested)  LOWER EXTREMITY MMT:    MMT Right Eval Left Eval  Hip flexion 5/5 5/5  Hip extension    Hip abduction 5/5 5/5  Hip adduction 5/5 5/5  Hip internal rotation    Hip external rotation    Knee flexion 5/5 5/5   Knee extension 5/5 5/5  Ankle dorsiflexion    Ankle plantarflexion    Ankle inversion    Ankle eversion    (Blank rows = not tested)  BED MOBILITY:  Sit to supine Complete Independence and Modified independence Supine to sit Modified independence Rolling to Right Complete Independence Rolling to Left Complete Independence  TRANSFERS: Assistive device utilized: None  Sit to stand: Modified independence Stand to sit: Complete Independence Chair to chair: Modified independence Floor:  NT   STAIRS: Level of Assistance:  NT Stair Negotiation Technique:  with  Number of Stairs:   Height of Stairs:   Comments: NT  GAIT: Gait pattern: wide BOS, R lateral lean using wall to balance on the way back to room Distance walked: 50 ft Assistive device utilized: None Level of assistance: Min A with HHA from wife (pt did not want to, but was leaning) Comments: could potentially use AD  FUNCTIONAL TESTS:  5 times sit to stand: 9.26 seconds Berg Balance Scale: 48/56  PATIENT SURVEYS:  NT today                                                                                                                              TREATMENT DATE: 03/09/24 Elliptical L5 x44mins  Resisted gait on to BOSU 40# x5 leading each side  On BOSU straight arm pulls 2x10  Quick taps 4" step  Ladder drills -quick steps in and out laterally, then 1 foot -two feet in and out of each box  -big steps skipping over squares -jumping two feet in box and out Walk outdoors 2 laps    03/04/24  Elliptical L6 x8 minutes for w/u and neural priming  Walking outside down/up hills and curbs with ball toss to self min guard Grape vines down and up hill Walking down hill forward/up backwards with ball toss to self Walking down backwards/up forwards with ball toss to self Sideways high knees + lateral step overs + BOSU kicks + backwards figure 8 walks (3 rounds) Standing on soft surface of BOSU 30 seconds + dribbling  yellow weighted ball between cones + lateral alternating cone taps + step over BOSU 1 round     02/25/24 Nustep level 5 x 5 minutes keeping above 70 SPM Walking outside around building good pace, no rest, some ball [employer] kicking On rocker board 2 ways On bosu reaching for numbers On bosu ball toss On airex 15# extension On airex 10# chest press Side step on and off airex and then with ball toss Skipping Side shuffling  02/24/24:  Gait outdoors on asphalt and sidewalk, curb, for 8 min, occasional weaving, self corrects Bosu on smooth side , throwing and catching a ball Bosu on smooth side in ll bars, reaching forward to targets, then facing side ways and reaching with same hand and across body to opposite side Resisted gait 5 x each direction 50#, lost balance more with walking backwards then change to controlling forward pace RDL's single leg, to elevated hi/low mat, 5 x each leg In ll bars for forward toe taps to cone, from 8" step In ll bars for vertical jumps x 30 sec Side weaving, over/ under, x 20' x 2 each direction, walking slowly  02/20/24 Gait outside around the back building good pace, a few times veered to the left, but never a LOB Resisted gait all directions Patient holding me back with walking using tband Walking ball toss On bosu balance and then reaching On rockerboard balance and reaching On bosu catch On airex 15# straight arm pulls and chest press Leg press 40# 2x10 35# lats 2x10 15# 2x10 35# HS curls On airex cone toe touches, needed CGA due to LOB on this  02/13/24 Treadmill walk Seated row 35# 2x10 Lat pull down 35# 2x10 Obstacle course over obstacles, around cones, on airex, across beam, on 6" step min-modA  Catch on BOSU, both sides- harder on ball side  Leg extensions 15# 2x10 HS curls 25# 2x10 Resisted gait with step up x5 each side 30#        PATIENT EDUCATION: Education details: Diagnosis, Prognosis, POC, holding off on  HEP Person educated: Patient and Spouse Education method: Explanation, Demonstration, Tactile cues, and Verbal cues Education comprehension: verbalized understanding, returned demonstration, verbal cues required, tactile cues required, and needs further education  HOME EXERCISE PROGRAM:  Access Code: [email or web address] + up and walking 5-10 minutes every hour awake  URL: [email or web address] Date: 01/20/2024 Prepared by: Nedra Hai  Exercises - Tandem Stance in Corner  - 1-2 x daily - 7 x weekly - 1 sets - 3 reps - 30 seconds  hold - Seated Gaze Stabilization with Head Rotation  - 1-2 x daily - 7 x weekly - 1 sets - 2 reps - 60 seconds  hold   GOALS: Goals reviewed with patient? No  SHORT TERM GOALS: Target  date: 01/25/24  Initial HEP will be prescribed by 3rd visit Baseline: not provided  Goal status: MET 01/20/24  2.  Headache pain will not increase above 7/10 during session.  Baseline: started at 7/10, pt verbalized it was worse at end of eval  Goal status: ongoing 01/21/24, MET 01/28/24  3.  Activity increase will not elicit vomiting. Baseline: vomited at end of eval after BERG testing. Goal status: ongoing 01/21/24, doing harder tasks in sessions no vomiting in 3 sessions 01/28/24, progressing 02/10/24, MET 03/09/24   LONG TERM GOALS: Target date: 03/27/24  Pt will be independent with advanced HEP, as prescribed throughout POC. Baseline: not prescribed yet Goal status: ongoing 02/20/24  2.  Pt will decrease HA pain to </= 4/10 with activity.  Baseline: > 7/10 Goal status: progressing 02/25/24  3.  Pt will increase BERG balance test score to >/= 52/56. Baseline: 48/56 Goal status: MET 54/56 02/04/24  4.  Pt will complete full session without vomiting or any other adverse reactions.  Baseline: vomited after eval Goal status: progressing 2 sessions back to back w/vomit, 2 sessions without, MET 02/10/24  5. Pt will complete SLS and tandem on firm and foam without LOB  >10s   Baseline: 5s  Goal status: progressing 02/20/24    ASSESSMENT:  CLINICAL IMPRESSION: Continued working on functional activity tolerance, balance, coordination and agility training today. He is really doing much better with his main remaining concerns being balance and coordination. Patient is a bit fatigued and out of breath after elliptical warm up. Needs longer rest break than usual. Agility ladder drill caused some fatigue. Moves slow with coordination but one he is able to get the rhythm he does fairly well.   OBJECTIVE IMPAIRMENTS: decreased activity tolerance, decreased balance, difficulty walking, decreased safety awareness, impaired perceived functional ability, increased muscle spasms, and pain.   ACTIVITY LIMITATIONS: standing, stairs, and locomotion level  PARTICIPATION LIMITATIONS: cleaning, laundry, driving, shopping, community activity, and occupation  PERSONAL FACTORS: 1 comorbidity: history of stroke  are also affecting patient's functional outcome.   REHAB POTENTIAL: Good  CLINICAL DECISION MAKING: Evolving/moderate complexity  EVALUATION COMPLEXITY: Moderate  PLAN:  PT FREQUENCY: 1-2x/week  PT DURATION: 8 weeks  PLANNED INTERVENTIONS: 97164- PT Re-evaluation, 97110-Therapeutic exercises, 97530- Therapeutic activity, [id number]- Neuromuscular re-education, 97535- Self Care, 74259- Manual therapy, and 97116- Gait training  PLAN FOR NEXT SESSION: Continue to advance balance coordination and overall functional endurance as able, check LTGs    Nedra Hai, PT, DPT 03/09/24 2:46 PM

## 2024-03-11 ENCOUNTER — Ambulatory Visit

## 2024-03-16 ENCOUNTER — Encounter: Payer: Self-pay | Admitting: Occupational Therapy

## 2024-03-16 ENCOUNTER — Ambulatory Visit: Attending: Physical Medicine and Rehabilitation | Admitting: Occupational Therapy

## 2024-03-16 DIAGNOSIS — R278 Other lack of coordination: Secondary | ICD-10-CM | POA: Diagnosis present

## 2024-03-16 DIAGNOSIS — R262 Difficulty in walking, not elsewhere classified: Secondary | ICD-10-CM | POA: Insufficient documentation

## 2024-03-16 DIAGNOSIS — R41842 Visuospatial deficit: Secondary | ICD-10-CM | POA: Insufficient documentation

## 2024-03-16 DIAGNOSIS — M6281 Muscle weakness (generalized): Secondary | ICD-10-CM | POA: Insufficient documentation

## 2024-03-16 DIAGNOSIS — R42 Dizziness and giddiness: Secondary | ICD-10-CM | POA: Diagnosis present

## 2024-03-16 DIAGNOSIS — R41844 Frontal lobe and executive function deficit: Secondary | ICD-10-CM | POA: Insufficient documentation

## 2024-03-16 DIAGNOSIS — R2689 Other abnormalities of gait and mobility: Secondary | ICD-10-CM | POA: Diagnosis present

## 2024-03-16 NOTE — Therapy (Unsigned)
 OUTPATIENT OCCUPATIONAL THERAPY NEURO Treatment  Patient Name: Wayne Lee MRN: [id number] DOB:01/28/1976, 48 y.o., male Today's Date: 03/16/2024  PCP: none REFERRING PROVIDER: Dr. Shearon Stalls  END OF SESSION:  OT End of Session - 03/16/24 1547     Visit Number 10    Number of Visits 25    Date for OT Re-Evaluation 03/31/24    Authorization Time Period 12 weeks    OT Start Time 1533    OT Stop Time 1615    OT Time Calculation (min) 42 min                    Past Medical History:  Diagnosis Date   Atopic dermatitis in adult    Left cataract    Obesity (BMI 30-39.9)    Stroke [facility])    Undescended left testicle    had surgical intervention   Past Surgical History:  Procedure Laterality Date   CATARACT EXTRACTION W/ INTRAOCULAR LENS IMPLANT Left 2017   CHOLECYSTECTOMY     ORCHIOPEXY  1984   SUBOCCIPITAL CRANIECTOMY CERVICAL LAMINECTOMY N/A 12/16/2023   Procedure: SUBOCCIPITAL CRANIECTOMY;  Surgeon: Barnett Abu, MD;  Location: [facility] OR;  Service: Neurosurgery;  Laterality: N/A;   Patient Active Problem List   Diagnosis Date Noted   Chronic fatigue 02/12/2024   Nausea and vomiting 01/13/2024   Central nervous system origin vertigo, unspecified laterality 01/13/2024   Impaired mobility and ADLs 01/13/2024   Bilateral headaches 12/31/2023   ICH (intracerebral hemorrhage) (HCC) 12/20/2023   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   Hypertension 12/19/2023   Brain compression (HCC) 12/19/2023   Nontraumatic acute cerebral hemorrhage (HCC) 12/16/2023   Cerebellar hemorrhage (HCC) 12/16/2023    ONSET DATE: 12/16/23  REFERRING DIAG: I61.9 (ICD-10-CM) - Nontraumatic acute cerebral hemorrhage (HCC)   THERAPY DIAG:  Other lack of coordination  Muscle weakness (generalized)  Other abnormalities of gait and mobility  Visuospatial deficit  Rationale for Evaluation and Treatment: Rehabilitation  SUBJECTIVE:   SUBJECTIVE STATEMENT: 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 Extubated 1/7 PMH obesity, sleep apnea, HLD d/c home 1/21/5 Per pt's wife AVM  CT Head without contrast: Large acute 5.3 cm intraparenchymal hemorrhage in the left cerebellum. Mass effect with narrowed fourth ventricle, but no hydrocephalus at this time. Basal cisterns are effaced and there is early ascending transtentorial herniation. PRECAUTIONS: Fall  WEIGHT BEARING RESTRICTIONS: No  PAIN: no pain today FALLS: Has patient fallen in last 6 months? No  LIVING ENVIRONMENT: Lives with: lives with their family and lives with their spouse Lives in: House/apartment Stairs: yes  Has following equipment at home: None  PLOF: Independent  PATIENT GOALS: return to prior level  OBJECTIVE:  Note: Objective measures were completed at Evaluation unless otherwise noted.  HAND DOMINANCE: Right  ADLs: Overall ADLs: mod I-supervision with basic ADLS Transfers/ambulation related to ADLs: Eating: mod I Grooming: mod I UB Dressing: mod I LB Dressing: mod I Toileting: mod I Bathing: supervision, sits in bottom of bathtub Tub Shower transfers: supervision   IADLs:dependent for IADLS Medication management: wife is Teacher, English as a foreign language: dependent   MOBILITY STATUS:  mod I-supervision    ACTIVITY TOLERANCE: Activity tolerance: limited by nausea   UPPER EXTREMITY ROM:  WFLS   UPPER EXTREMITY MMT:     MMT Right eval  Left eval  Shoulder flexion 4+/5 4/5  Shoulder abduction    Shoulder adduction    Shoulder extension    Shoulder internal rotation    Shoulder external rotation    Middle trapezius    Lower trapezius    Elbow flexion 4+/5 4+/5  Elbow extension 4+/5 4/5  Wrist flexion    Wrist extension    Wrist ulnar deviation    Wrist  radial deviation    Wrist pronation    Wrist supination    (Blank rows = not tested)  HAND FUNCTION: Grip strength: Right: 63 lbs; Left: 48 lbs  COORDINATION: 9 Hole Peg test: Right: 26.25 sec; Left: 32.80 with several drops sec  SENSATION: WFL  COGNITION: Overall cognitive status:  recalls 3/3 words following short delay, spells WORLD backwards without difficulty  VISION: Subjective report: Pt reports double vision at times   VISION ASSESSMENT: To be further assessed in functional context Pt reports diplopia at times He was able to track to all 4 quadrants and visual fiels were intact. He became nauseous and vomited shortly after vision testing. Pt appears to have some vestibular issues.  Patient has difficulty with following activities due to following visual impairments: reading    OBSERVATIONS: Pleasant agreeable male, accompanied by his wife and 47 y.o son Pt's BP was elevated at end of session when he was vomiting 140/102, and 148/115, pt was instructed to monitor at home and to contact MD if diastolic BP does not reduce to below 100. pt's wife verbalized understanding                                                                                                                             TREATMENT DATE: 03/09/24- Bell's test; 32/34 located in 4 mins 37 secs. Pt located remaining 2 with min v.c  Organizing your day problem #1 for organization and attention to detail. Pt made 1 error. Pt scheduled himself to pick up a cake right before MD appointment, however he overlooked the fact that the cake is supposied to be an ice cream cake. Pt/ therapist discussed organization strategies he is using at work.  5/25- environmental scanning 14/14 items located in sequential order with mild dizziness tabletop scanning task 77/80 items located mild diplopia. Pt reports work activities are going well. Pt was encouraged to continue to take more frequent rest and brain breaks to avoid  over fatigue. Pt cooked shrimp this weekend without issues and he cared for his 2 y.o son.  Reviewed green theraband HEP. 15 rpes each, min v.c  02/19/24- Organizing your day task for attention to detail, and reading tolerance in prep for return to work . Pt has mod difficulty with task. He initally omitted 2 items and he did not account for the tempeture when buying groceries. He alos missed the detail requiring him to register for class at a specific time. Activity took pt the entire session and he reuired v.c to correct errors.  Pt reported fatigue with task at end of session. Therapist discsussed implications with similar activities with return to work and discussed memory strategies and reinforced improtance of writing things down rather than trying to remember. Pt returns to work next week.  3/03/25Reading activity to read larger print items on the I pad then smaller article on the computer. Pt reports it is not blurry but he has some visual fatigue. Pt was instructed in theraband exercises: green band for shoulder abduction, then blue for remaining exercises- see pt instructions, 15 reps each, min v.c for positioning. Pt was isntructed that he can try blue band for shoulder abduction, however he should stop if he has any increased pain.  02/05/24- constant therapy alternating symbols 88% accuracy, 42.62 secs response time, for alternating attention with visual component Ambulating while tossing ball and performing category generation, only 2-3 cues for entire alphabet. Copying small peg design with LUE for increased fine motor coordiantion with a visual and cognitive component, min difficulty/ v.c.  Removing with in hand manipulation, min-mod drops. UBE x 6 mins level 3 for conditioning and reciprocal movement   01/30/24- Constant therapy:Put steps in order( for reading activity), 100% accuracy, no increased dizziness.  Followed by reading a map 80% acuracy, 35.52 response time, due to pt moving  quickly and mistakenly hitting one itme and one error due to not double checking. Reading accuracy 80% accuracy. Grooved pegs for increased fine motor coordination, min difficulty/ drops, min v.c Environmental scanning to locate items 1-9 in squential order, pt with good success. Amb while tossing ball and naming animals for every letter of alphabet, min questionsing cues.  01/22/24- see pt. education Copying small peg design with LUE for fine motor coordination with a visual componant, min difficulty/ drops for coordination. Pt copied design correctly without v.c Number cancellation 85M with 100% accuracy and no reports of dizziness nausea    01/15/24-coordiantion HEP issued, see education  01/07/24 eval        PATIENT EDUCATION: Education details: green theraband  Person educated: Patient and Spouse Education method: Explanation, demonstration, v.c , Education comprehension: verbalized understanding, returned demonstration,   HOME EXERCISE PROGRAM: coordination, putty   GOALS: Goals reviewed with patient? Yes  SHORT TERM GOALS: Target date: 02/06/24  I with HEP  Goal status: met, 01/22/24- demonstrates understanding of putty exercises  2.  Pt will demonstrate improved fine motor coordination as evidenced by decreasing 9 hole peg test LUE to 29 secs or less without several drops.  Goal status:met, 27.07 secs  3.  Pt will increase LUE grip strength to 52 lbs or greater for increased ease with daily activities.  Goal status: met, 63 lbs  4.  Pt will perfrom tabletop scanning without diplopia or vomiting with 90% or better accuracy.  Goal status: met 03/09/24, Bell's test 94% accuracy  5.  Pt will perfrom transitional movements for ADLS/ IADLs without LOB or nausea greater than 3/10.  Goal status: ongoing 02/19/24  6. I with adapted strategies for ADLs/IADLs to improve safety and I.   Goal status: ongoing 02/19/24  LONG TERM GOALS: Target date: 03/31/24  I with  updated HEP  Goal status: INITIAL  2.  Pt will perform mod complex home management mod I  Goal status: ongoing  3.  Pt will perform environmental scanning in a busy environment with 90% or better accuracy, without vomiting  Goal status: ongoing, performed on 02/04/24  4.  Pt will perfrom simulated work activities mod I  Goal status: ongoing  performed last week    ASSESSMENT:  CLINICAL IMPRESSION: Pt is progressing towards goals. Pt demonstrates improving tabletop scanning. Therapist discussed importance of attention to detail with pt during work activities.PERFORMANCE DEFICITS: in functional skills including ADLs, IADLs, coordination, dexterity, ROM, strength, Fine motor control, Gross motor control, balance, endurance, decreased knowledge of precautions, decreased knowledge of use of DME, vision, UE functional use, and vestibular, cognitive skills including safety awareness, and psychosocial skills including coping strategies, environmental adaptation, habits, interpersonal interactions, and routines and behaviors.   IMPAIRMENTS: are limiting patient from ADLs, IADLs, rest and sleep, work, play, leisure, and social participation.   CO-MORBIDITIES: may have co-morbidities  that affects occupational performance. Patient will benefit from skilled OT to address above impairments and improve overall function.  MODIFICATION OR ASSISTANCE TO COMPLETE EVALUATION: Min-Moderate modification of tasks or assist with assess necessary to complete an evaluation.  OT OCCUPATIONAL PROFILE AND HISTORY: Detailed assessment: Review of records and additional review of physical, cognitive, psychosocial history related to current functional performance.  CLINICAL DECISION MAKING: LOW - limited treatment options, no task modification necessary  REHAB POTENTIAL: Good  EVALUATION COMPLEXITY: Low    PLAN:  OT FREQUENCY: 2x/week plus eval  OT DURATION: 12 weeks  PLANNED INTERVENTIONS: 97168 OT  Re-evaluation, 97535 self care/ADL training, 09604 therapeutic exercise, 97530 therapeutic activity, 97112 neuromuscular re-education, 97140 manual therapy, 97116 gait training, 54098 aquatic therapy, 97035 ultrasound, 97018 paraffin, 11914 moist heat, 97010 cryotherapy, 97034 contrast bath, balance training, functional mobility training, visual/perceptual remediation/compensation, energy conservation, coping strategies training, patient/family education, and DME and/or AE instructions  RECOMMENDED OTHER SERVICES: PT  CONSULTED AND AGREED WITH PLAN OF CARE: Patient  PLAN FOR NEXT SESSION:  continue with strength and endurance and simulated work activities.  Sonna Lipsky, OT 03/16/2024, 4:16 PM                   Shakena Callari, OT 03/16/2024, 4:16 PM

## 2024-03-17 ENCOUNTER — Encounter: Payer: Self-pay | Admitting: Occupational Therapy

## 2024-03-17 NOTE — Therapy (Signed)
 OUTPATIENT PHYSICAL THERAPY NEURO TREATMENT    Patient Name: Wayne Lee MRN: [id number] DOB:1976/05/01, 48 y.o., male Today's Date: 03/18/2024   PCP: No PCP REFERRING PROVIDER: Delle Reining, PA-C  END OF SESSION:  PT End of Session - 03/18/24 1615     Visit Number 17    Date for PT Re-Evaluation 04/07/24    Authorization Type [employer]    PT Start Time 1615    PT Stop Time 1700    PT Time Calculation (min) 45 min    Activity Tolerance Patient tolerated treatment well    Behavior During Therapy WFL for tasks assessed/performed                          Past Medical History:  Diagnosis Date   Atopic dermatitis in adult    Left cataract    Obesity (BMI 30-39.9)    Stroke [facility])    Undescended left testicle    had surgical intervention   Past Surgical History:  Procedure Laterality Date   CATARACT EXTRACTION W/ INTRAOCULAR LENS IMPLANT Left 2017   CHOLECYSTECTOMY     ORCHIOPEXY  1984   SUBOCCIPITAL CRANIECTOMY CERVICAL LAMINECTOMY N/A 12/16/2023   Procedure: SUBOCCIPITAL CRANIECTOMY;  Surgeon: Barnett Abu, MD;  Location: [facility] OR;  Service: Neurosurgery;  Laterality: N/A;   Patient Active Problem List   Diagnosis Date Noted   Chronic fatigue 02/12/2024   Nausea and vomiting 01/13/2024   Central nervous system origin vertigo, unspecified laterality 01/13/2024   Impaired mobility and ADLs 01/13/2024   Bilateral headaches 12/31/2023   ICH (intracerebral hemorrhage) (HCC) 12/20/2023   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   Hypertension 12/19/2023   Brain compression (HCC) 12/19/2023   Nontraumatic acute cerebral hemorrhage (HCC) 12/16/2023   Cerebellar hemorrhage (HCC) 12/16/2023    ONSET DATE: 12/15/22  REFERRING DIAG:  Diagnosis  I61.9 (ICD-10-CM) - Nontraumatic acute cerebral hemorrhage (HCC)    THERAPY DIAG:  Other lack of coordination  Muscle weakness (generalized)  Other abnormalities of gait and  mobility  Difficulty in walking, not elsewhere classified  Rationale for Evaluation and Treatment: Rehabilitation  SUBJECTIVE:                                                                                                                                                                                             SUBJECTIVE STATEMENT: I have been teetering a little bit, feel off balance but haven't changed anything.    Pt accompanied by: self  PERTINENT HISTORY: 1/6 stroke, ICU and IRF leaving hospital 1/21  PAIN:  Are you having pain? No  0/10 but a little dizzy   PRECAUTIONS: None  RED FLAGS: None   WEIGHT BEARING RESTRICTIONS: No  FALLS: Has patient fallen in last 6 months? No  LIVING ENVIRONMENT: Lives with: lives with their family and lives with their spouse Lives in: House/apartment Stairs: Yes: Internal: 15 steps; can reach both and External: 2 steps; none front door 4 steps rails on both sides Has following equipment at home: None  PLOF:  supervision for I/ADLs  PATIENT GOALS: walking, energy, endurance  OBJECTIVE:  Note: Objective measures were completed at Evaluation unless otherwise noted.  DIAGNOSTIC FINDINGS:  Nothing new  COGNITION: Overall cognitive status: Within functional limits for tasks assessed   SENSATION: WFL  COORDINATION: Toe taps and heel-to-shin normal  EDEMA:  None  MUSCLE TONE: RLE: Within functional limits  MUSCLE LENGTH: NT  POSTURE:  very wide BOS   LOWER EXTREMITY ROM:   WFL   Active  Right Eval Left Eval  Hip flexion    Hip extension    Hip abduction    Hip adduction    Hip internal rotation    Hip external rotation    Knee flexion    Knee extension    Ankle dorsiflexion    Ankle plantarflexion    Ankle inversion    Ankle eversion     (Blank rows = not tested)  LOWER EXTREMITY MMT:    MMT Right Eval Left Eval  Hip flexion 5/5 5/5  Hip extension    Hip abduction 5/5 5/5  Hip adduction 5/5 5/5   Hip internal rotation    Hip external rotation    Knee flexion 5/5 5/5  Knee extension 5/5 5/5  Ankle dorsiflexion    Ankle plantarflexion    Ankle inversion    Ankle eversion    (Blank rows = not tested)  BED MOBILITY:  Sit to supine Complete Independence and Modified independence Supine to sit Modified independence Rolling to Right Complete Independence Rolling to Left Complete Independence  TRANSFERS: Assistive device utilized: None  Sit to stand: Modified independence Stand to sit: Complete Independence Chair to chair: Modified independence Floor:  NT   STAIRS: Level of Assistance:  NT Stair Negotiation Technique:  with  Number of Stairs:   Height of Stairs:   Comments: NT  GAIT: Gait pattern: wide BOS, R lateral lean using wall to balance on the way back to room Distance walked: 50 ft Assistive device utilized: None Level of assistance: Min A with HHA from wife (pt did not want to, but was leaning) Comments: could potentially use AD  FUNCTIONAL TESTS:  5 times sit to stand: 9.26 seconds Berg Balance Scale: 48/56  PATIENT SURVEYS:  NT today                                                                                                                              TREATMENT DATE: 03/18/24 Bike with 2 power bursts L4 x6mins  Resisted hold 50#  doing cone taps, then on airex-- some LOB On BOSU catch, then multi-tasking counting backwards from 50 by 2, then counting forward by 3s  Trampoline jumps- single leg, double leg, in and out, scissoring 2x10 HS curls 45# 2x10 Leg ext 25# 2x10 Plyo pushes on leg press 20# 20s x2    03/09/24 Elliptical L5 x38mins  Resisted gait on to airex 40# x5 leading each side  On airex straight arm pulls 2x10 Quick taps 4" step  Ladder drills -quick steps in and out laterally, then 1 foot -two feet in and out of each box  -big steps skipping over squares -jumping two feet in box and out Walk outdoors 2 laps     03/04/24  Elliptical L6 x8 minutes for w/u and neural priming  Walking outside down/up hills and curbs with ball toss to self min guard Grape vines down and up hill Walking down hill forward/up backwards with ball toss to self Walking down backwards/up forwards with ball toss to self Sideways high knees + lateral step overs + BOSU kicks + backwards figure 8 walks (3 rounds) Standing on soft surface of BOSU 30 seconds + dribbling yellow weighted ball between cones + lateral alternating cone taps + step over BOSU 1 round     02/25/24 Nustep level 5 x 5 minutes keeping above 70 SPM Walking outside around building good pace, no rest, some ball [employer] kicking On rocker board 2 ways On bosu reaching for numbers On bosu ball toss On airex 15# extension On airex 10# chest press Side step on and off airex and then with ball toss Skipping Side shuffling  02/24/24:  Gait outdoors on asphalt and sidewalk, curb, for 8 min, occasional weaving, self corrects Bosu on smooth side , throwing and catching a ball Bosu on smooth side in ll bars, reaching forward to targets, then facing side ways and reaching with same hand and across body to opposite side Resisted gait 5 x each direction 50#, lost balance more with walking backwards then change to controlling forward pace RDL's single leg, to elevated hi/low mat, 5 x each leg In ll bars for forward toe taps to cone, from 8" step In ll bars for vertical jumps x 30 sec Side weaving, over/ under, x 20' x 2 each direction, walking slowly  02/20/24 Gait outside around the back building good pace, a few times veered to the left, but never a LOB Resisted gait all directions Patient holding me back with walking using tband Walking ball toss On bosu balance and then reaching On rockerboard balance and reaching On bosu catch On airex 15# straight arm pulls and chest press Leg press 40# 2x10 35# lats 2x10 15# 2x10 35# HS curls On airex  cone toe touches, needed CGA due to LOB on this  02/13/24 Treadmill walk Seated row 35# 2x10 Lat pull down 35# 2x10 Obstacle course over obstacles, around cones, on airex, across beam, on 6" step min-modA  Catch on BOSU, both sides- harder on ball side  Leg extensions 15# 2x10 HS curls 25# 2x10 Resisted gait with step up x5 each side 30#        PATIENT EDUCATION: Education details: Diagnosis, Prognosis, POC, holding off on HEP Person educated: Patient and Spouse Education method: Explanation, Demonstration, Tactile cues, and Verbal cues Education comprehension: verbalized understanding, returned demonstration, verbal cues required, tactile cues required, and needs further education  HOME EXERCISE PROGRAM:  Access Code: [email or web address] + up and walking 5-10 minutes every hour  awake  URL: [email or web address] Date: 01/20/2024 Prepared by: Nedra Hai  Exercises - Tandem Stance in Corner  - 1-2 x daily - 7 x weekly - 1 sets - 3 reps - 30 seconds  hold - Seated Gaze Stabilization with Head Rotation  - 1-2 x daily - 7 x weekly - 1 sets - 2 reps - 60 seconds  hold   GOALS: Goals reviewed with patient? No  SHORT TERM GOALS: Target date: 01/25/24  Initial HEP will be prescribed by 3rd visit Baseline: not provided  Goal status: MET 01/20/24  2.  Headache pain will not increase above 7/10 during session.  Baseline: started at 7/10, pt verbalized it was worse at end of eval  Goal status: ongoing 01/21/24, MET 01/28/24  3.  Activity increase will not elicit vomiting. Baseline: vomited at end of eval after BERG testing. Goal status: ongoing 01/21/24, doing harder tasks in sessions no vomiting in 3 sessions 01/28/24, progressing 02/10/24, MET 03/09/24   LONG TERM GOALS: Target date: 04/07/24  Pt will be independent with advanced HEP, as prescribed throughout POC. Baseline: not prescribed yet Goal status: ongoing 02/20/24  2.  Pt will decrease HA pain to </= 4/10 with  activity.  Baseline: > 7/10 Goal status: progressing 02/25/24  3.  Pt will increase BERG balance test score to >/= 52/56. Baseline: 48/56 Goal status: MET 54/56 02/04/24  4.  Pt will complete full session without vomiting or any other adverse reactions.  Baseline: vomited after eval Goal status: progressing 2 sessions back to back w/vomit, 2 sessions without, MET 02/10/24  5. Pt will complete SLS and tandem on firm and foam without LOB >10s   Baseline: 5s  Goal status: progressing 02/20/24    ASSESSMENT:  CLINICAL IMPRESSION: Continued working on functional activity tolerance, balance, coordination and agility training today. He is doing better with balance and coordination. Patient is a bit fatigued and out of breath after jumps on trampoline.  Agility ladder drill caused some fatigue. Moves slow with coordination but one he is able to get the rhythm he does fairly well.   OBJECTIVE IMPAIRMENTS: decreased activity tolerance, decreased balance, difficulty walking, decreased safety awareness, impaired perceived functional ability, increased muscle spasms, and pain.   ACTIVITY LIMITATIONS: standing, stairs, and locomotion level  PARTICIPATION LIMITATIONS: cleaning, laundry, driving, shopping, community activity, and occupation  PERSONAL FACTORS: 1 comorbidity: history of stroke  are also affecting patient's functional outcome.   REHAB POTENTIAL: Good  CLINICAL DECISION MAKING: Evolving/moderate complexity  EVALUATION COMPLEXITY: Moderate  PLAN:  PT FREQUENCY: 1-2x/week  PT DURATION: 8 weeks  PLANNED INTERVENTIONS: 97164- PT Re-evaluation, 97110-Therapeutic exercises, 97530- Therapeutic activity, [id number]- Neuromuscular re-education, 97535- Self Care, [id number]- Manual therapy, and 97116- Gait training  PLAN FOR NEXT SESSION: Continue to advance balance coordination and overall functional endurance as able, check LTGs   Cassie Freer, PT, DPT 03/18/24 4:59 PM

## 2024-03-18 ENCOUNTER — Ambulatory Visit

## 2024-03-18 DIAGNOSIS — R2689 Other abnormalities of gait and mobility: Secondary | ICD-10-CM

## 2024-03-18 DIAGNOSIS — M6281 Muscle weakness (generalized): Secondary | ICD-10-CM

## 2024-03-18 DIAGNOSIS — R278 Other lack of coordination: Secondary | ICD-10-CM | POA: Diagnosis not present

## 2024-03-18 DIAGNOSIS — R262 Difficulty in walking, not elsewhere classified: Secondary | ICD-10-CM

## 2024-03-19 ENCOUNTER — Telehealth: Payer: Self-pay | Admitting: *Deleted

## 2024-03-19 NOTE — Telephone Encounter (Signed)
 Mrs Kraynak called with questions about his nausea patch. I tried to call her back but there was no answer and VM full and not accepting messages.

## 2024-03-23 ENCOUNTER — Encounter: Payer: Self-pay | Admitting: Physical Therapy

## 2024-03-23 ENCOUNTER — Encounter: Payer: Self-pay | Admitting: Occupational Therapy

## 2024-03-23 ENCOUNTER — Ambulatory Visit: Admitting: Occupational Therapy

## 2024-03-23 ENCOUNTER — Ambulatory Visit: Admitting: Physical Therapy

## 2024-03-23 DIAGNOSIS — R278 Other lack of coordination: Secondary | ICD-10-CM

## 2024-03-23 DIAGNOSIS — M6281 Muscle weakness (generalized): Secondary | ICD-10-CM

## 2024-03-23 DIAGNOSIS — R2689 Other abnormalities of gait and mobility: Secondary | ICD-10-CM

## 2024-03-23 DIAGNOSIS — R262 Difficulty in walking, not elsewhere classified: Secondary | ICD-10-CM

## 2024-03-23 DIAGNOSIS — R41842 Visuospatial deficit: Secondary | ICD-10-CM

## 2024-03-23 NOTE — Patient Instructions (Signed)
 Strengthening Pronation / Supination    Keep right forearm on table, hand over edge, palm-up. Holding hammer , turn palm down. Hold ___5_ seconds. Turn palm up. Hold __5__ seconds. Repeat _10-20___ times. Do ___1_ sessions per day.  http://gt2.exer.us [email or web address]   Copyright  VHI. All rights reserved.   WRIST: Extension (Band)    Start with palm down, forearm resting on thigh. Holding band, bend wrist to raise hand. Hold _5__ seconds. Use ___red_____ band. __10_ reps per set, _1__ sets per day, _7__ days per week Repeat with palm up 10 reps  Copyright  VHI. All rights reserved.

## 2024-03-23 NOTE — Therapy (Signed)
 OUTPATIENT OCCUPATIONAL THERAPY NEURO Treatment  Patient Name: Wayne Lee MRN: [id number] DOB:01-27-1976, 48 y.o., male Today's Date: 03/23/2024  PCP: none REFERRING PROVIDER: Dr. Shearon Stalls  END OF SESSION:  OT End of Session - 03/23/24 1100     Visit Number 11    Number of Visits 25    Date for OT Re-Evaluation 03/31/24    Authorization Type aetna    Authorization Time Period 12 weeks    OT Start Time 1102    OT Stop Time 1142    OT Time Calculation (min) 40 min                    Past Medical History:  Diagnosis Date   Atopic dermatitis in adult    Left cataract    Obesity (BMI 30-39.9)    Stroke [facility])    Undescended left testicle    had surgical intervention   Past Surgical History:  Procedure Laterality Date   CATARACT EXTRACTION W/ INTRAOCULAR LENS IMPLANT Left 2017   CHOLECYSTECTOMY     ORCHIOPEXY  1984   SUBOCCIPITAL CRANIECTOMY CERVICAL LAMINECTOMY N/A 12/16/2023   Procedure: SUBOCCIPITAL CRANIECTOMY;  Surgeon: Barnett Abu, MD;  Location: [facility] OR;  Service: Neurosurgery;  Laterality: N/A;   Patient Active Problem List   Diagnosis Date Noted   Chronic fatigue 02/12/2024   Nausea and vomiting 01/13/2024   Central nervous system origin vertigo, unspecified laterality 01/13/2024   Impaired mobility and ADLs 01/13/2024   Bilateral headaches 12/31/2023   ICH (intracerebral hemorrhage) (HCC) 12/20/2023   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   Hypertension 12/19/2023   Brain compression (HCC) 12/19/2023   Nontraumatic acute cerebral hemorrhage (HCC) 12/16/2023   Cerebellar hemorrhage (HCC) 12/16/2023    ONSET DATE: 12/16/23  REFERRING DIAG: I61.9 (ICD-10-CM) - Nontraumatic acute cerebral hemorrhage (HCC)   THERAPY DIAG:  Other lack of coordination  Muscle weakness (generalized)  Visuospatial deficit  Other abnormalities of gait and mobility  Rationale for Evaluation and Treatment: Rehabilitation  SUBJECTIVE:    SUBJECTIVE STATEMENT: Pt reports coordination difficulties Pt accompanied by: significant other  PERTINENT HISTORY: 48 yo male arrival 1/6 with HTN 204/106 taken to emergent OR hematoma evacuation s/p suboccipital craniotomy and hypertonic saline started. Extubated 1/7 PMH obesity, sleep apnea, HLD d/c home 1/21/5 Per pt's wife AVM  CT Head without contrast: Large acute 5.3 cm intraparenchymal hemorrhage in the left cerebellum. Mass effect with narrowed fourth ventricle, but no hydrocephalus at this time. Basal cisterns are effaced and there is early ascending transtentorial herniation. PRECAUTIONS: Fall  WEIGHT BEARING RESTRICTIONS: No  PAIN: no pain today FALLS: Has patient fallen in last 6 months? No  LIVING ENVIRONMENT: Lives with: lives with their family and lives with their spouse Lives in: House/apartment Stairs: yes  Has following equipment at home: None  PLOF: Independent  PATIENT GOALS: return to prior level  OBJECTIVE:  Note: Objective measures were completed at Evaluation unless otherwise noted.  HAND DOMINANCE: Right  ADLs: Overall ADLs: mod I-supervision with basic ADLS Transfers/ambulation related to ADLs: Eating: mod I Grooming: mod I UB Dressing: mod I LB Dressing: mod I Toileting: mod I Bathing: supervision, sits in bottom of bathtub Tub Shower transfers: supervision   IADLs:dependent for IADLS Medication management: wife is Teacher, English as a foreign language: dependent   MOBILITY STATUS:  mod I-supervision    ACTIVITY TOLERANCE: Activity tolerance: limited by nausea   UPPER EXTREMITY ROM:  WFLS   UPPER EXTREMITY MMT:     MMT Right eval  Left eval  Shoulder flexion 4+/5 4/5  Shoulder abduction    Shoulder adduction    Shoulder extension    Shoulder internal rotation    Shoulder external rotation    Middle trapezius    Lower trapezius    Elbow flexion 4+/5 4+/5  Elbow extension 4+/5 4/5  Wrist flexion    Wrist extension     Wrist ulnar deviation    Wrist radial deviation    Wrist pronation    Wrist supination    (Blank rows = not tested)  HAND FUNCTION: Grip strength: Right: 63 lbs; Left: 48 lbs  COORDINATION: 9 Hole Peg test: Right: 26.25 sec; Left: 32.80 with several drops sec  SENSATION: WFL  COGNITION: Overall cognitive status:  recalls 3/3 words following short delay, spells WORLD backwards without difficulty  VISION: Subjective report: Pt reports double vision at times   VISION ASSESSMENT: To be further assessed in functional context Pt reports diplopia at times He was able to track to all 4 quadrants and visual fiels were intact. He became nauseous and vomited shortly after vision testing. Pt appears to have some vestibular issues.  Patient has difficulty with following activities due to following visual impairments: reading    OBSERVATIONS: Pleasant agreeable male, accompanied by his wife and 1 y.o son Pt's BP was elevated at end of session when he was vomiting 140/102, and 148/115, pt was instructed to monitor at home and to contact MD if diastolic BP does not reduce to below 100. pt's wife verbalized understanding                                                                                                                             TREATMENT DATE: 03/23/24-discussed how pt's recent work activities went. 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  Therapist reviewed coordination activities to perfrom at home: stacking and manipulating coins, flipping and dealing cards, rotating a ball in hand, twirtling pen, min v.c to slow down. Placing grooved pegs into pegboard with LUE and removing with tweezers for increased fine motor coordination, min difficulty v.c Gripper set at level 3 sustained gtip to pick up blocks, min difficulty/ v.c see pt instructions  03/16/24- UBE x 6 mins level 3 for conditioning simulated work  activitie; including writing pro's and con's for attending in person activities for graduation and pinning ceremony. 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 Therapist provided min v.c for organization/ planning.  03/09/24- Bell's test; 32/34 located in 4 mins 37 secs. Pt located remaining 2 with min v.c  Organizing your day problem #1 for organization and attention to detail. Pt made 1 error. Pt scheduled himself to pick up a cake right before MD appointment, however he overlooked the fact that the cake is supposed to be an ice  cream cake. Pt/ therapist discussed organization strategies he is using at work.  5/25- environmental scanning 14/14 items located in sequential order with mild dizziness tabletop scanning task 77/80 items located mild diplopia. Pt reports work activities are going well. Pt was encouraged to continue to take more frequent rest and brain breaks to avoid over fatigue. Pt cooked shrimp this weekend without issues and he cared for his 2 y.o son.  Reviewed green theraband HEP. 15 rpes each, min v.c  02/19/24- Organizing your day task for attention to detail, and reading tolerance in prep for return to work . Pt has mod difficulty with task. He initally omitted 2 items and he did not account for the tempeture when buying groceries. He alos missed the detail requiring him to register for class at a specific time. Activity took pt the entire session and he reuired v.c to correct errors. Pt reported fatigue with task at end of session. Therapist discsussed implications with similar activities with return to work and discussed memory strategies and reinforced improtance of writing things down rather than trying to remember. Pt returns to work next week.  3/03/25Reading activity to read larger print items on the I pad then smaller article on the computer. Pt reports it is not blurry but  he has some visual fatigue. Pt was instructed in theraband exercises: green band for shoulder abduction, then blue for remaining exercises- see pt instructions, 15 reps each, min v.c for positioning. Pt was isntructed that he can try blue band for shoulder abduction, however he should stop if he has any increased pain.  02/05/24- constant therapy alternating symbols 88% accuracy, 42.62 secs response time, for alternating attention with visual component Ambulating while tossing ball and performing category generation, only 2-3 cues for entire alphabet. Copying small peg design with LUE for increased fine motor coordiantion with a visual and cognitive component, min difficulty/ v.c.  Removing with in hand manipulation, min-mod drops. UBE x 6 mins level 3 for conditioning and reciprocal movement   01/30/24- Constant therapy:Put steps in order( for reading activity), 100% accuracy, no increased dizziness.  Followed by reading a map 80% acuracy, 35.52 response time, due to pt moving quickly and mistakenly hitting one itme and one error due to not double checking. Reading accuracy 80% accuracy. Grooved pegs for increased fine motor coordination, min difficulty/ drops, min v.c Environmental scanning to locate items 1-9 in squential order, pt with good success. Amb while tossing ball and naming animals for every letter of alphabet, min questionsing cues.  01/22/24- see pt. education Copying small peg design with LUE for fine motor coordination with a visual componant, min difficulty/ drops for coordination. Pt copied design correctly without v.c Number cancellation 67M with 100% accuracy and no reports of dizziness nausea    01/15/24-coordiantion HEP issued, see education  01/07/24 eval        PATIENT EDUCATION: Education details:  wrist strengthening, 10 reps each- see pt instructions Person educated: Patient and Spouse Education method: Explanation, demonstration, v.c , handout Education  comprehension: verbalized understanding, returned demonstration,   HOME EXERCISE PROGRAM: coordination, putty   GOALS: Goals reviewed with patient? Yes  SHORT TERM GOALS: Target date: 02/06/24  I with HEP  Goal status: met, 01/22/24- demonstrates understanding of putty exercises  2.  Pt will demonstrate improved fine motor coordination as evidenced by decreasing 9 hole peg test LUE to 29 secs or less without several drops.  Goal status:met, 27.07 secs  3.  Pt will increase LUE grip  strength to 52 lbs or greater for increased ease with daily activities.  Goal status: met, 63 lbs  4.  Pt will perfrom tabletop scanning without diplopia or vomiting with 90% or better accuracy.  Goal status: met 03/09/24, Bell's test 94% accuracy  5.  Pt will perfrom transitional movements for ADLS/ IADLs without LOB or nausea greater than 3/10.  Goal status: ongoing 02/19/24  6. I with adapted strategies for ADLs/IADLs to improve safety and I.   Goal status: ongoing 02/19/24  LONG TERM GOALS: Target date: 03/31/24  I with updated HEP  Goal status; ongoing  2.  Pt will perform mod complex home management mod I  Goal status: ongoing,  3.  Pt will perform environmental scanning in a busy environment with 90% or better accuracy, without vomiting  Goal status: ongoing, performed on 02/04/24  4.  Pt will perfrom simulated work activities mod I  Goal status: ongoing performed last week    ASSESSMENT:  CLINICAL IMPRESSION: Pt is progressing towards goals. Therapist discussed with pt/ wife continuing occupational therapy to address coordiantion and functional use of LUE.PERFORMANCE DEFICITS: in functional skills including ADLs, IADLs, coordination, dexterity, ROM, strength, Fine motor control, Gross motor control, balance, endurance, decreased knowledge of precautions, decreased knowledge of use of DME, vision, UE functional use, and vestibular, cognitive skills including safety awareness, and  psychosocial skills including coping strategies, environmental adaptation, habits, interpersonal interactions, and routines and behaviors.   IMPAIRMENTS: are limiting patient from ADLs, IADLs, rest and sleep, work, play, leisure, and social participation.   CO-MORBIDITIES: may have co-morbidities  that affects occupational performance. Patient will benefit from skilled OT to address above impairments and improve overall function.  MODIFICATION OR ASSISTANCE TO COMPLETE EVALUATION: Min-Moderate modification of tasks or assist with assess necessary to complete an evaluation.  OT OCCUPATIONAL PROFILE AND HISTORY: Detailed assessment: Review of records and additional review of physical, cognitive, psychosocial history related to current functional performance.  CLINICAL DECISION MAKING: LOW - limited treatment options, no task modification necessary  REHAB POTENTIAL: Good  EVALUATION COMPLEXITY: Low    PLAN:  OT FREQUENCY: 2x/week plus eval  OT DURATION: 12 weeks  PLANNED INTERVENTIONS: 97168 OT Re-evaluation, 97535 self care/ADL training, 84132 therapeutic exercise, 97530 therapeutic activity, 97112 neuromuscular re-education, 97140 manual therapy, 97116 gait training, 44010 aquatic therapy, 97035 ultrasound, 97018 paraffin, 27253 moist heat, 97010 cryotherapy, 97034 contrast bath, balance training, functional mobility training, visual/perceptual remediation/compensation, energy conservation, coping strategies training, patient/family education, and DME and/or AE instructions  RECOMMENDED OTHER SERVICES: PT  CONSULTED AND AGREED WITH PLAN OF CARE: Patient  PLAN FOR NEXT SESSION:   check on updated HEP.  Tonantzin Mimnaugh, OT 03/23/2024, 11:01 AM                   Jaxtyn Linville, OT 03/23/2024, 11:01 AM

## 2024-03-23 NOTE — Therapy (Signed)
 OUTPATIENT PHYSICAL THERAPY NEURO TREATMENT PROGRESS NOTE     Patient Name: Wayne Lee MRN: [id number] DOB:[birth year], 48 y.o., male Today's Date: 03/23/2024   PCP: No PCP REFERRING PROVIDER: Jean Michaelis, PA-C  END OF SESSION:  PT End of Session - 03/23/24 1206     Visit Number 18    Authorization Type [employer]    PT Start Time 1147    PT Stop Time 1226    PT Time Calculation (min) 39 min    Activity Tolerance Patient tolerated treatment well    Behavior During Therapy WFL for tasks assessed/performed                           Past Medical History:  Diagnosis Date   Atopic dermatitis in adult    Left cataract    Obesity (BMI 30-39.9)    Stroke [facility])    Undescended left testicle    had surgical intervention   Past Surgical History:  Procedure Laterality Date   CATARACT EXTRACTION W/ INTRAOCULAR LENS IMPLANT Left 2017   CHOLECYSTECTOMY     ORCHIOPEXY  1984   SUBOCCIPITAL CRANIECTOMY CERVICAL LAMINECTOMY N/A 12/16/2023   Procedure: SUBOCCIPITAL CRANIECTOMY;  Surgeon: Elna Haggis, MD;  Location: [facility] OR;  Service: Neurosurgery;  Laterality: N/A;   Patient Active Problem List   Diagnosis Date Noted   Chronic fatigue 02/12/2024   Nausea and vomiting 01/13/2024   Central nervous system origin vertigo, unspecified laterality 01/13/2024   Impaired mobility and ADLs 01/13/2024   Bilateral headaches 12/31/2023   ICH (intracerebral hemorrhage) (HCC) 12/20/2023   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   Hypertension 12/19/2023   Brain compression (HCC) 12/19/2023   Nontraumatic acute cerebral hemorrhage (HCC) 12/16/2023   Cerebellar hemorrhage (HCC) 12/16/2023    ONSET DATE: 12/15/22  REFERRING DIAG:  Diagnosis  I61.9 (ICD-10-CM) - Nontraumatic acute cerebral hemorrhage (HCC)    THERAPY DIAG:  Other lack of coordination  Muscle weakness (generalized)  Other abnormalities of gait and mobility  Difficulty in walking,  not elsewhere classified  Rationale for Evaluation and Treatment: Rehabilitation  SUBJECTIVE:                                                                                                                                                                                             SUBJECTIVE STATEMENT:  Doing well, still veer to the left sometimes when walking but its controllable. I do stumble and tip but its not a problem to correct. Would rate myself at 88/100 right now, still moving a little slow and do veer left, some unsteadiness  with corners.    Pt accompanied by: self  PERTINENT HISTORY: 1/6 stroke, ICU and IRF leaving hospital 1/21  PAIN:  Are you having pain? No 0/10   PRECAUTIONS: None  RED FLAGS: None   WEIGHT BEARING RESTRICTIONS: No  FALLS: Has patient fallen in last 6 months? No  LIVING ENVIRONMENT: Lives with: lives with their family and lives with their spouse Lives in: House/apartment Stairs: Yes: Internal: 15 steps; can reach both and External: 2 steps; none front door 4 steps rails on both sides Has following equipment at home: None  PLOF:  supervision for I/ADLs  PATIENT GOALS: walking, energy, endurance  OBJECTIVE:  Note: Objective measures were completed at Evaluation unless otherwise noted.  DIAGNOSTIC FINDINGS:  Nothing new  COGNITION: Overall cognitive status: Within functional limits for tasks assessed   SENSATION: WFL  COORDINATION: Toe taps and heel-to-shin normal  EDEMA:  None  MUSCLE TONE: RLE: Within functional limits  MUSCLE LENGTH: NT  POSTURE:  very wide BOS   LOWER EXTREMITY ROM:   WFL   Active  Right Eval Left Eval  Hip flexion    Hip extension    Hip abduction    Hip adduction    Hip internal rotation    Hip external rotation    Knee flexion    Knee extension    Ankle dorsiflexion    Ankle plantarflexion    Ankle inversion    Ankle eversion     (Blank rows = not tested)  LOWER EXTREMITY MMT:     MMT Right Eval Left Eval  Hip flexion 5/5 5/5  Hip extension    Hip abduction 5/5 5/5  Hip adduction 5/5 5/5  Hip internal rotation    Hip external rotation    Knee flexion 5/5 5/5  Knee extension 5/5 5/5  Ankle dorsiflexion    Ankle plantarflexion    Ankle inversion    Ankle eversion    (Blank rows = not tested)  BED MOBILITY:  Sit to supine Complete Independence and Modified independence Supine to sit Modified independence Rolling to Right Complete Independence Rolling to Left Complete Independence  TRANSFERS: Assistive device utilized: None  Sit to stand: Modified independence Stand to sit: Complete Independence Chair to chair: Modified independence Floor:  NT   STAIRS: Level of Assistance:  NT Stair Negotiation Technique:  with  Number of Stairs:   Height of Stairs:   Comments: NT  GAIT: Gait pattern: wide BOS, R lateral lean using wall to balance on the way back to room Distance walked: 50 ft Assistive device utilized: None Level of assistance: Min A with HHA from wife (pt did not want to, but was leaning) Comments: could potentially use AD  FUNCTIONAL TESTS:  5 times sit to stand: 9.26 seconds Berg Balance Scale: 48/56  PATIENT SURVEYS:  NT today  TREATMENT DATE:   03/23/24  Nustep L6x8 minutes SPM >90 BLEs only  FGA 28/30, SLS and tandem on foam testing    Tandem stance on blue foam pad + head turns x6 rounds Tandem walks blue foam pad x4 rounds Forward lunges onto BOSU x10 B Lateral lunges onto BOSU x10 B Runners step up onto BOSU x10 B  Education on results of testing today and plan to hold PT for now, DC if we do not hear back within 30 days       03/18/24 Bike with 2 power bursts L4 x35mins  Resisted hold 50# doing cone taps, then on airex-- some LOB On BOSU catch, then multi-tasking counting backwards  from 50 by 2, then counting forward by 3s  Trampoline jumps- single leg, double leg, in and out, scissoring 2x10 HS curls 45# 2x10 Leg ext 25# 2x10 Plyo pushes on leg press 20# 20s x2    03/09/24 Elliptical L5 x70mins  Resisted gait on to airex 40# x5 leading each side  On airex straight arm pulls 2x10 Quick taps 4" step  Ladder drills -quick steps in and out laterally, then 1 foot -two feet in and out of each box  -big steps skipping over squares -jumping two feet in box and out Walk outdoors 2 laps    03/04/24  Elliptical L6 x8 minutes for w/u and neural priming  Walking outside down/up hills and curbs with ball toss to self min guard Grape vines down and up hill Walking down hill forward/up backwards with ball toss to self Walking down backwards/up forwards with ball toss to self Sideways high knees + lateral step overs + BOSU kicks + backwards figure 8 walks (3 rounds) Standing on soft surface of BOSU 30 seconds + dribbling yellow weighted ball between cones + lateral alternating cone taps + step over BOSU 1 round     02/25/24 Nustep level 5 x 5 minutes keeping above 70 SPM Walking outside around building good pace, no rest, some ball [employer] kicking On rocker board 2 ways On bosu reaching for numbers On bosu ball toss On airex 15# extension On airex 10# chest press Side step on and off airex and then with ball toss Skipping Side shuffling  02/24/24:  Gait outdoors on asphalt and sidewalk, curb, for 8 min, occasional weaving, self corrects Bosu on smooth side , throwing and catching a ball Bosu on smooth side in ll bars, reaching forward to targets, then facing side ways and reaching with same hand and across body to opposite side Resisted gait 5 x each direction 50#, lost balance more with walking backwards then change to controlling forward pace RDL's single leg, to elevated hi/low mat, 5 x each leg In ll bars for forward toe taps to cone, from 8" step In  ll bars for vertical jumps x 30 sec Side weaving, over/ under, x 20' x 2 each direction, walking slowly  02/20/24 Gait outside around the back building good pace, a few times veered to the left, but never a LOB Resisted gait all directions Patient holding me back with walking using tband Walking ball toss On bosu balance and then reaching On rockerboard balance and reaching On bosu catch On airex 15# straight arm pulls and chest press Leg press 40# 2x10 35# lats 2x10 15# 2x10 35# HS curls On airex cone toe touches, needed CGA due to LOB on this  02/13/24 Treadmill walk Seated row 35# 2x10 Lat pull down 35# 2x10 Obstacle course  over obstacles, around cones, on airex, across beam, on 6" step min-modA  Catch on BOSU, both sides- harder on ball side  Leg extensions 15# 2x10 HS curls 25# 2x10 Resisted gait with step up x5 each side 30#        PATIENT EDUCATION: Education details: Diagnosis, Prognosis, POC, holding off on HEP Person educated: Patient and Spouse Education method: Explanation, Demonstration, Tactile cues, and Verbal cues Education comprehension: verbalized understanding, returned demonstration, verbal cues required, tactile cues required, and needs further education  HOME EXERCISE PROGRAM:  Access Code: [email or web address] URL: [email or web address] Date: 03/23/2024 Prepared by: Nedra Hai  Exercises - Tandem Stance in Corner  - 1-2 x daily - 7 x weekly - 1 sets - 3 reps - 30 seconds  hold - Seated Gaze Stabilization with Head Rotation  - 1-2 x daily - 7 x weekly - 1 sets - 2 reps - 60 seconds  hold - Tandem Stance with Head Rotation  - 1 x daily - 7 x weekly - 1 sets - 4 reps - Backward Tandem Walking  - 1 x daily - 7 x weekly - 1 sets - 5 reps - Walking Tandem Stance  - 1 x daily - 7 x weekly - 1 sets - 5 reps - Lunge Onto BOSU Ball  - 1 x daily - 7 x weekly - 1-2 sets - 10 reps - Runner's Step Up on BOSU Ball  - 1 x daily - 7 x weekly - 1-2  sets - 10 reps   GOALS: Goals reviewed with patient? No  SHORT TERM GOALS: Target date: 01/25/24  Initial HEP will be prescribed by 3rd visit Baseline: not provided  Goal status: MET 01/20/24  2.  Headache pain will not increase above 7/10 during session.  Baseline: started at 7/10, pt verbalized it was worse at end of eval  Goal status: ongoing 01/21/24, MET 01/28/24  3.  Activity increase will not elicit vomiting. Baseline: vomited at end of eval after BERG testing. Goal status: ongoing 01/21/24, doing harder tasks in sessions no vomiting in 3 sessions 01/28/24, progressing 02/10/24, MET 03/09/24   LONG TERM GOALS: Target date: 04/07/24  Pt will be independent with advanced HEP, as prescribed throughout POC. Baseline: not prescribed yet Goal status: ongoing 02/20/24  2.  Pt will decrease HA pain to </= 4/10 with activity.  Baseline: > 7/10 Goal status: progressing 02/25/24; 03/23/24- good days and bad days   3.  Pt will increase BERG balance test score to >/= 52/56. Baseline: 48/56 Goal status: MET 54/56 02/04/24  4.  Pt will complete full session without vomiting or any other adverse reactions.  Baseline: vomited after eval Goal status: progressing 2 sessions back to back w/vomit, 2 sessions without, MET 02/10/24  5. Pt will complete SLS and tandem on firm and foam without LOB >10s   Baseline: 5s  Goal status: progressing 02/20/24 MET 03/23/24    ASSESSMENT:  CLINICAL IMPRESSION:  Pt arrives today really doing well- got some objective measures and he was able to max out all tests today. He does have some ongoing residual sx like HA with exertion and mild unsteadiness at times, but I fully anticipate that these will continue to improve as he continues to heal. We will go on hold after today but do not anticipate he will need skilled services moving forward.     OBJECTIVE IMPAIRMENTS: decreased activity tolerance, decreased balance, difficulty walking, decreased safety awareness,  impaired perceived functional ability, increased muscle spasms, and  pain.   ACTIVITY LIMITATIONS: standing, stairs, and locomotion level  PARTICIPATION LIMITATIONS: cleaning, laundry, driving, shopping, community activity, and occupation  PERSONAL FACTORS: 1 comorbidity: history of stroke  are also affecting patient's functional outcome.   REHAB POTENTIAL: Good  CLINICAL DECISION MAKING: Evolving/moderate complexity  EVALUATION COMPLEXITY: Moderate  PLAN:  PT FREQUENCY: 1-2x/week  PT DURATION: 8 weeks  PLANNED INTERVENTIONS: 97164- PT Re-evaluation, 97110-Therapeutic exercises, 97530- Therapeutic activity, [id number]- Neuromuscular re-education, 97535- Self Care, 16109- Manual therapy, and 97116- Gait training  PLAN FOR NEXT SESSION: on  30 day hold until 5/14, DC if we do not hear from him   Terrel Ferries, PT, DPT 03/23/24 12:28 PM

## 2024-03-26 ENCOUNTER — Ambulatory Visit: Admitting: Occupational Therapy

## 2024-03-26 ENCOUNTER — Ambulatory Visit

## 2024-03-26 DIAGNOSIS — M6281 Muscle weakness (generalized): Secondary | ICD-10-CM

## 2024-03-26 DIAGNOSIS — R41844 Frontal lobe and executive function deficit: Secondary | ICD-10-CM

## 2024-03-26 DIAGNOSIS — R278 Other lack of coordination: Secondary | ICD-10-CM | POA: Diagnosis not present

## 2024-03-26 DIAGNOSIS — R2689 Other abnormalities of gait and mobility: Secondary | ICD-10-CM

## 2024-03-26 DIAGNOSIS — R41842 Visuospatial deficit: Secondary | ICD-10-CM

## 2024-03-26 NOTE — Therapy (Signed)
 OUTPATIENT OCCUPATIONAL THERAPY NEURO Treatment  Patient Name: Wayne Lee MRN: [id number] DOB:[birth year], 48 y.o., male Today's Date: 03/26/2024  PCP: none REFERRING PROVIDER: Dr. Shearon Stalls  END OF SESSION:  OT End of Session - 03/26/24 1132     Visit Number 12    Number of Visits 25    Date for OT Re-Evaluation 03/31/24    Authorization Type aetna    Authorization Time Period 12 weeks    OT Start Time 1130    OT Stop Time 1215    OT Time Calculation (min) 45 min                    Past Medical History:  Diagnosis Date   Atopic dermatitis in adult    Left cataract    Obesity (BMI 30-39.9)    Stroke [facility])    Undescended left testicle    had surgical intervention   Past Surgical History:  Procedure Laterality Date   CATARACT EXTRACTION W/ INTRAOCULAR LENS IMPLANT Left 2017   CHOLECYSTECTOMY     ORCHIOPEXY  1984   SUBOCCIPITAL CRANIECTOMY CERVICAL LAMINECTOMY N/A 12/16/2023   Procedure: SUBOCCIPITAL CRANIECTOMY;  Surgeon: Barnett Abu, MD;  Location: [facility] OR;  Service: Neurosurgery;  Laterality: N/A;   Patient Active Problem List   Diagnosis Date Noted   Chronic fatigue 02/12/2024   Nausea and vomiting 01/13/2024   Central nervous system origin vertigo, unspecified laterality 01/13/2024   Impaired mobility and ADLs 01/13/2024   Bilateral headaches 12/31/2023   ICH (intracerebral hemorrhage) (HCC) 12/20/2023   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   Hypertension 12/19/2023   Brain compression (HCC) 12/19/2023   Nontraumatic acute cerebral hemorrhage (HCC) 12/16/2023   Cerebellar hemorrhage (HCC) 12/16/2023    ONSET DATE: 12/16/23  REFERRING DIAG: I61.9 (ICD-10-CM) - Nontraumatic acute cerebral hemorrhage (HCC)   THERAPY DIAG:  Other lack of coordination  Muscle weakness (generalized)  Visuospatial deficit  Other abnormalities of gait and mobility  Rationale for Evaluation and Treatment: Rehabilitation  SUBJECTIVE:    SUBJECTIVE STATEMENT: Pt reports dropping items at home Pt accompanied by: significant other  PERTINENT HISTORY: 48 yo male arrival 1/6 with HTN 204/106 taken to emergent OR hematoma evacuation s/p suboccipital craniotomy and hypertonic saline started. Extubated 1/7 PMH obesity, sleep apnea, HLD d/c home 1/21/5 Per pt's wife AVM  CT Head without contrast: Large acute 5.3 cm intraparenchymal hemorrhage in the left cerebellum. Mass effect with narrowed fourth ventricle, but no hydrocephalus at this time. Basal cisterns are effaced and there is early ascending transtentorial herniation. PRECAUTIONS: Fall  WEIGHT BEARING RESTRICTIONS: No  PAIN: no pain today FALLS: Has patient fallen in last 6 months? No  LIVING ENVIRONMENT: Lives with: lives with their family and lives with their spouse Lives in: House/apartment Stairs: yes  Has following equipment at home: None  PLOF: Independent  PATIENT GOALS: return to prior level  OBJECTIVE:  Note: Objective measures were completed at Evaluation unless otherwise noted.  HAND DOMINANCE: Right  ADLs: Overall ADLs: mod I-supervision with basic ADLS Transfers/ambulation related to ADLs: Eating: mod I Grooming: mod I UB Dressing: mod I LB Dressing: mod I Toileting: mod I Bathing: supervision, sits in bottom of bathtub Tub Shower transfers: supervision   IADLs:dependent for IADLS Medication management: wife is Teacher, English as a foreign language: dependent   MOBILITY STATUS:  mod I-supervision    ACTIVITY TOLERANCE: Activity tolerance: limited by nausea   UPPER EXTREMITY ROM:  WFLS   UPPER EXTREMITY MMT:     MMT  Right eval Left eval  Shoulder flexion 4+/5 4/5  Shoulder abduction    Shoulder adduction    Shoulder extension    Shoulder internal rotation    Shoulder external rotation    Middle trapezius    Lower trapezius    Elbow flexion 4+/5 4+/5  Elbow extension 4+/5 4/5  Wrist flexion    Wrist extension     Wrist ulnar deviation    Wrist radial deviation    Wrist pronation    Wrist supination    (Blank rows = not tested)  HAND FUNCTION: Grip strength: Right: 63 lbs; Left: 48 lbs  COORDINATION: 9 Hole Peg test: Right: 26.25 sec; Left: 32.80 with several drops sec  SENSATION: WFL  COGNITION: Overall cognitive status:  recalls 3/3 words following short delay, spells WORLD backwards without difficulty  VISION: Subjective report: Pt reports double vision at times   VISION ASSESSMENT: To be further assessed in functional context Pt reports diplopia at times He was able to track to all 4 quadrants and visual fiels were intact. He became nauseous and vomited shortly after vision testing. Pt appears to have some vestibular issues.  Patient has difficulty with following activities due to following visual impairments: reading    OBSERVATIONS: Pleasant agreeable male, accompanied by his wife and 1 y.o son Pt's BP was elevated at end of session when he was vomiting 140/102, and 148/115, pt was instructed to monitor at home and to contact MD if diastolic BP does not reduce to below 100. pt's wife verbalized understanding                                                                                                                             TREATMENT DATE:03/26/24- Therpist checked progress towards goals, plans for updated goals and discussed plans for OT moving forward. Functional reaching into cabinets from low to high, no LOB, however increased dizziness. Pt is noted to move too quickly at times. Therpist discussed importance of slowing down. Pt reports continued coordination difficulties with LUE.  Therapist discussed activities to continue at home: theraband, putty, tossing ball and performing category generation. UBE x 5 mins level 4 for conditioning.  03/23/24-discussed how pt's recent work activities went. 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  Therapist reviewed coordination activities to perfrom at home: stacking and manipulating coins, flipping and dealing cards, rotating a ball in hand, twirtling pen, min v.c to slow down. Placing grooved pegs into pegboard with LUE and removing with tweezers for increased fine motor coordination, min difficulty v.c Gripper set at level 3 sustained gtip to pick up blocks, min difficulty/ v.c see pt instructions  03/16/24- UBE x 6 mins level 3 for conditioning simulated work activitie; including writing pro's and con's for attending in person activities for graduation and pinning ceremony. 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 Therapist provided min v.c for organization/ planning.  03/09/24- Bell's test; 32/34 located in 4 mins 37 secs. Pt located remaining 2 with min v.c  Organizing your day problem #1 for organization and attention to detail. Pt made 1 error. Pt scheduled himself to pick up a cake right before MD appointment, however he overlooked the fact that the cake is supposed to be an ice cream cake. Pt/ therapist discussed organization strategies he is using at work.  5/25- environmental scanning 14/14 items located in sequential order with mild dizziness tabletop scanning task 77/80 items located mild diplopia. Pt reports work activities are going well. Pt was encouraged to continue to take more frequent rest and brain breaks to avoid over fatigue. Pt cooked shrimp this weekend without issues and he cared for his 2 y.o son.  Reviewed green theraband HEP. 15 reps each, min v.c  02/19/24- Organizing your day task for attention to detail, and reading tolerance in prep for return to work . Pt has mod difficulty with task. He initally omitted 2 items and he did not account for the tempeture when buying groceries. He alos missed the detail requiring him to register  for class at a specific time. Activity took pt the entire session and he reuired v.c to correct errors. Pt reported fatigue with task at end of session. Therapist discsussed implications with similar activities with return to work and discussed memory strategies and reinforced improtance of writing things down rather than trying to remember. Pt returns to work next week.  3/03/25Reading activity to read larger print items on the I pad then smaller article on the computer. Pt reports it is not blurry but he has some visual fatigue. Pt was instructed in theraband exercises: green band for shoulder abduction, then blue for remaining exercises- see pt instructions, 15 reps each, min v.c for positioning. Pt was isntructed that he can try blue band for shoulder abduction, however he should stop if he has any increased pain.      PATIENT EDUCATION: Education details: progress towards goals and plans for continued OT Person educated: Patient and Spouse Education method: Explanation, demonstration, v.c , handout Education comprehension: verbalized understanding, returned demonstration,   HOME EXERCISE PROGRAM: coordination, putty   GOALS: Goals reviewed with patient? Yes  SHORT TERM GOALS:   I with HEP  Goal status: met, 01/22/24- demonstrates understanding of putty exercises,   2.  Pt will demonstrate improved fine motor coordination as evidenced by decreasing 9 hole peg test LUE to 29 secs or less without several drops.  Goal status:met, 27.07 secs,  03/26/24- 26.63   3.  Pt will increase LUE grip strength to 52 lbs or greater for increased ease with daily activities.  Goal status: met, 63 lbs, 03/26/24- 65 lbs  4.  Pt will perfrom tabletop scanning without diplopia or vomiting with 90% or better accuracy.  Goal status: met 03/09/24, Bell's test 94% accuracy  5.  Pt will perfrom transitional movements for ADLS/ IADLs without LOB or nausea greater than 3/10.  Goal status: met,  dizziness 6/10, small headache  6. I with adapted strategies for ADLs/IADLs to improve safety and I.   Goal status: met, v.c to slow down  LONG TERM GOALS: Target date:   I with updated HEP  Goal status; met, theraband  2.  Pt will perform mod complex home management mod I  Goal status: met laundry and simple cooking mod I  3.  Pt will perform environmental scanning in a busy environment with 90% or better accuracy, without vomiting  Goal status:  met,   4.  Pt will perfrom simulated work activities mod I  Goal status: met, pt reports fatigue at times however performing mod I  5. Pt will demonstrate ability to carry an item in left and right hand simultaneously without drops.           goal status: new  6. I with updates to HEP  goal status: new  7.  Pt will demonstrate improved endurance for ADLS/IADLs with pt reporting ability to stand for daily activities for 1 hour with no more than a 10 min break.       Goal status: new  8. Pt will demonstrate ability to problems solve strategies for work activities should new challenges arise.     goal status: new.    ASSESSMENT:  CLINICAL IMPRESSION: Pt demonstrates good overall progress. He has achieved all inital long term goals however he will continue to check in with OT every several weeks for updates to HEP and to continue to progress towards updated goals. Pt can benefit from skilled occupational therapy to address the performance deficits listed below in order to maximize pt's safety and I with ADLs/IADLs.Aaron AasPERFORMANCE DEFICITS: in functional skills including ADLs, IADLs, coordination, dexterity, ROM, strength, Fine motor control, Gross motor control, balance, endurance, decreased knowledge of precautions, decreased knowledge of use of DME, vision, UE functional use, and vestibular, cognitive skills including safety awareness, and psychosocial skills including coping strategies, environmental adaptation, habits, interpersonal  interactions, and routines and behaviors.   IMPAIRMENTS: are limiting patient from ADLs, IADLs, rest and sleep, work, play, leisure, and social participation.   CO-MORBIDITIES: may have co-morbidities  that affects occupational performance. Patient will benefit from skilled OT to address above impairments and improve overall function.  MODIFICATION OR ASSISTANCE TO COMPLETE EVALUATION: Min-Moderate modification of tasks or assist with assess necessary to complete an evaluation.  OT OCCUPATIONAL PROFILE AND HISTORY: Detailed assessment: Review of records and additional review of physical, cognitive, psychosocial history related to current functional performance.  CLINICAL DECISION MAKING: LOW - limited treatment options, no task modification necessary  REHAB POTENTIAL: Good  EVALUATION COMPLEXITY: Low    PLAN:  OT FREQUENCY:5 visits  OT DURATION: 9 weeks  PLANNED INTERVENTIONS: 97168 OT Re-evaluation, 97535 self care/ADL training, 53664 therapeutic exercise, 97530 therapeutic activity, 97112 neuromuscular re-education, 97140 manual therapy, 97116 gait training, 40347 aquatic therapy, 97035 ultrasound, 97018 paraffin, 42595 moist heat, 97010 cryotherapy, 97034 contrast bath, balance training, functional mobility training, visual/perceptual remediation/compensation, energy conservation, coping strategies training, patient/family education, and DME and/or AE instructions  RECOMMENDED OTHER SERVICES: PT  CONSULTED AND AGREED WITH PLAN OF CARE: Patient  PLAN FOR NEXT SESSION:   update HEP  Yvonnie Schinke, OT 03/26/2024, 11:33 AM                   Sarahbeth Cashin, OT 03/26/2024, 11:33 AM

## 2024-03-30 ENCOUNTER — Ambulatory Visit

## 2024-03-31 ENCOUNTER — Ambulatory Visit

## 2024-03-31 ENCOUNTER — Ambulatory Visit: Admitting: Occupational Therapy

## 2024-03-31 DIAGNOSIS — R278 Other lack of coordination: Secondary | ICD-10-CM | POA: Diagnosis not present

## 2024-03-31 DIAGNOSIS — R2689 Other abnormalities of gait and mobility: Secondary | ICD-10-CM

## 2024-03-31 DIAGNOSIS — M6281 Muscle weakness (generalized): Secondary | ICD-10-CM

## 2024-03-31 DIAGNOSIS — R41842 Visuospatial deficit: Secondary | ICD-10-CM

## 2024-03-31 NOTE — Therapy (Signed)
 OUTPATIENT PHYSICAL THERAPY NEURO TREATMENT    Patient Name: Wayne Lee MRN: [id number] DOB:December 23, 1975, 48 y.o., male Today's Date: 03/31/2024   PCP: No PCP REFERRING PROVIDER: Jean Michaelis, PA-C  END OF SESSION:  PT End of Session - 03/31/24 1722     Visit Number 19    Authorization Type [employer]    PT Start Time 1722    PT Stop Time 1800    PT Time Calculation (min) 38 min    Activity Tolerance Patient tolerated treatment well    Behavior During Therapy WFL for tasks assessed/performed                            Past Medical History:  Diagnosis Date   Atopic dermatitis in adult    Left cataract    Obesity (BMI 30-39.9)    Stroke [facility])    Undescended left testicle    had surgical intervention   Past Surgical History:  Procedure Laterality Date   CATARACT EXTRACTION W/ INTRAOCULAR LENS IMPLANT Left 2017   CHOLECYSTECTOMY     ORCHIOPEXY  1984   SUBOCCIPITAL CRANIECTOMY CERVICAL LAMINECTOMY N/A 12/16/2023   Procedure: SUBOCCIPITAL CRANIECTOMY;  Surgeon: Elna Haggis, MD;  Location: [facility] OR;  Service: Neurosurgery;  Laterality: N/A;   Patient Active Problem List   Diagnosis Date Noted   Chronic fatigue 02/12/2024   Nausea and vomiting 01/13/2024   Central nervous system origin vertigo, unspecified laterality 01/13/2024   Impaired mobility and ADLs 01/13/2024   Bilateral headaches 12/31/2023   ICH (intracerebral hemorrhage) (HCC) 12/20/2023   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   Hypertension 12/19/2023   Brain compression (HCC) 12/19/2023   Nontraumatic acute cerebral hemorrhage (HCC) 12/16/2023   Cerebellar hemorrhage (HCC) 12/16/2023    ONSET DATE: 12/15/22  REFERRING DIAG:  Diagnosis  I61.9 (ICD-10-CM) - Nontraumatic acute cerebral hemorrhage (HCC)    THERAPY DIAG:  Other lack of coordination  Muscle weakness (generalized)  Visuospatial deficit  Other abnormalities of gait and mobility  Rationale  for Evaluation and Treatment: Rehabilitation  SUBJECTIVE:                                                                                                                                                                                             SUBJECTIVE STATEMENT:  I am doing alright.   Pt accompanied by: self  PERTINENT HISTORY: 1/6 stroke, ICU and IRF leaving hospital 1/21  PAIN:  Are you having pain? No 0/10   PRECAUTIONS: None  RED FLAGS: None   WEIGHT BEARING RESTRICTIONS: No  FALLS: Has patient fallen  in last 6 months? No  LIVING ENVIRONMENT: Lives with: lives with their family and lives with their spouse Lives in: House/apartment Stairs: Yes: Internal: 15 steps; can reach both and External: 2 steps; none front door 4 steps rails on both sides Has following equipment at home: None  PLOF:  supervision for I/ADLs  PATIENT GOALS: walking, energy, endurance  OBJECTIVE:  Note: Objective measures were completed at Evaluation unless otherwise noted.  DIAGNOSTIC FINDINGS:  Nothing new  COGNITION: Overall cognitive status: Within functional limits for tasks assessed   SENSATION: WFL  COORDINATION: Toe taps and heel-to-shin normal  EDEMA:  None  MUSCLE TONE: RLE: Within functional limits  MUSCLE LENGTH: NT  POSTURE:  very wide BOS   LOWER EXTREMITY ROM:   WFL   Active  Right Eval Left Eval  Hip flexion    Hip extension    Hip abduction    Hip adduction    Hip internal rotation    Hip external rotation    Knee flexion    Knee extension    Ankle dorsiflexion    Ankle plantarflexion    Ankle inversion    Ankle eversion     (Blank rows = not tested)  LOWER EXTREMITY MMT:    MMT Right Eval Left Eval  Hip flexion 5/5 5/5  Hip extension    Hip abduction 5/5 5/5  Hip adduction 5/5 5/5  Hip internal rotation    Hip external rotation    Knee flexion 5/5 5/5  Knee extension 5/5 5/5  Ankle dorsiflexion    Ankle plantarflexion    Ankle  inversion    Ankle eversion    (Blank rows = not tested)  BED MOBILITY:  Sit to supine Complete Independence and Modified independence Supine to sit Modified independence Rolling to Right Complete Independence Rolling to Left Complete Independence  TRANSFERS: Assistive device utilized: None  Sit to stand: Modified independence Stand to sit: Complete Independence Chair to chair: Modified independence Floor:  NT   STAIRS: Level of Assistance:  NT Stair Negotiation Technique:  with  Number of Stairs:   Height of Stairs:   Comments: NT  GAIT: Gait pattern: wide BOS, R lateral lean using wall to balance on the way back to room Distance walked: 50 ft Assistive device utilized: None Level of assistance: Min A with HHA from wife (pt did not want to, but was leaning) Comments: could potentially use AD  FUNCTIONAL TESTS:  5 times sit to stand: 9.26 seconds Berg Balance Scale: 48/56  PATIENT SURVEYS:  NT today                                                                                                                              TREATMENT DATE:/ 03/31/24 Elliptical L3x63mins  NuStep L5x96mins  Resisted gait with step up forward and side ways 50# x5  Treadmill pushes forward and backwards 30s x2  [employer] with blue ball 2x10  STS with  8# each arm OHP 2x10  Side steps on beam with catch  03/23/24  Nustep L6x8 minutes SPM >90 BLEs only  FGA 28/30, SLS and tandem on foam testing    Tandem stance on blue foam pad + head turns x6 rounds Tandem walks blue foam pad x4 rounds Forward lunges onto BOSU x10 B Lateral lunges onto BOSU x10 B Runners step up onto BOSU x10 B  Education on results of testing today and plan to hold PT for now, DC if we do not hear back within 30 days     03/18/24 Bike with 2 power bursts L4 x50mins  Resisted hold 50# doing cone taps, then on airex-- some LOB On BOSU catch, then multi-tasking counting backwards from 50 by 2, then counting  forward by 3s  Trampoline jumps- single leg, double leg, in and out, scissoring 2x10 HS curls 45# 2x10 Leg ext 25# 2x10 Plyo pushes on leg press 20# 20s x2    03/09/24 Elliptical L5 x4mins  Resisted gait on to airex 40# x5 leading each side  On airex straight arm pulls 2x10 Quick taps 4" step  Ladder drills -quick steps in and out laterally, then 1 foot -two feet in and out of each box  -big steps skipping over squares -jumping two feet in box and out Walk outdoors 2 laps    03/04/24  Elliptical L6 x8 minutes for w/u and neural priming  Walking outside down/up hills and curbs with ball toss to self min guard Grape vines down and up hill Walking down hill forward/up backwards with ball toss to self Walking down backwards/up forwards with ball toss to self Sideways high knees + lateral step overs + BOSU kicks + backwards figure 8 walks (3 rounds) Standing on soft surface of BOSU 30 seconds + dribbling yellow weighted ball between cones + lateral alternating cone taps + step over BOSU 1 round     02/25/24 Nustep level 5 x 5 minutes keeping above 70 SPM Walking outside around building good pace, no rest, some ball [employer] kicking On rocker board 2 ways On bosu reaching for numbers On bosu ball toss On airex 15# extension On airex 10# chest press Side step on and off airex and then with ball toss Skipping Side shuffling  02/24/24:  Gait outdoors on asphalt and sidewalk, curb, for 8 min, occasional weaving, self corrects Bosu on smooth side , throwing and catching a ball Bosu on smooth side in ll bars, reaching forward to targets, then facing side ways and reaching with same hand and across body to opposite side Resisted gait 5 x each direction 50#, lost balance more with walking backwards then change to controlling forward pace RDL's single leg, to elevated hi/low mat, 5 x each leg In ll bars for forward toe taps to cone, from 8" step In ll bars for vertical jumps x  30 sec Side weaving, over/ under, x 20' x 2 each direction, walking slowly  02/20/24 Gait outside around the back building good pace, a few times veered to the left, but never a LOB Resisted gait all directions Patient holding me back with walking using tband Walking ball toss On bosu balance and then reaching On rockerboard balance and reaching On bosu catch On airex 15# straight arm pulls and chest press Leg press 40# 2x10 35# lats 2x10 15# 2x10 35# HS curls On airex cone toe touches, needed CGA due to LOB on this  02/13/24 Treadmill walk Seated row 35# 2x10  Lat pull down 35# 2x10 Obstacle course over obstacles, around cones, on airex, across beam, on 6" step min-modA  Catch on BOSU, both sides- harder on ball side  Leg extensions 15# 2x10 HS curls 25# 2x10 Resisted gait with step up x5 each side 30#        PATIENT EDUCATION: Education details: Diagnosis, Prognosis, POC, holding off on HEP Person educated: Patient and Spouse Education method: Explanation, Demonstration, Tactile cues, and Verbal cues Education comprehension: verbalized understanding, returned demonstration, verbal cues required, tactile cues required, and needs further education  HOME EXERCISE PROGRAM:  Access Code: [email or web address] URL: [email or web address] Date: 03/23/2024 Prepared by: Terrel Ferries  Exercises - Tandem Stance in Corner  - 1-2 x daily - 7 x weekly - 1 sets - 3 reps - 30 seconds  hold - Seated Gaze Stabilization with Head Rotation  - 1-2 x daily - 7 x weekly - 1 sets - 2 reps - 60 seconds  hold - Tandem Stance with Head Rotation  - 1 x daily - 7 x weekly - 1 sets - 4 reps - Backward Tandem Walking  - 1 x daily - 7 x weekly - 1 sets - 5 reps - Walking Tandem Stance  - 1 x daily - 7 x weekly - 1 sets - 5 reps - Lunge Onto BOSU Ball  - 1 x daily - 7 x weekly - 1-2 sets - 10 reps - Runner's Step Up on BOSU Ball  - 1 x daily - 7 x weekly - 1-2 sets - 10  reps   GOALS: Goals reviewed with patient? No  SHORT TERM GOALS: Target date: 01/25/24  Initial HEP will be prescribed by 3rd visit Baseline: not provided  Goal status: MET 01/20/24  2.  Headache pain will not increase above 7/10 during session.  Baseline: started at 7/10, pt verbalized it was worse at end of eval  Goal status: ongoing 01/21/24, MET 01/28/24  3.  Activity increase will not elicit vomiting. Baseline: vomited at end of eval after BERG testing. Goal status: ongoing 01/21/24, doing harder tasks in sessions no vomiting in 3 sessions 01/28/24, progressing 02/10/24, MET 03/09/24   LONG TERM GOALS: Target date: 04/07/24  Pt will be independent with advanced HEP, as prescribed throughout POC. Baseline: not prescribed yet Goal status: ongoing 02/20/24  2.  Pt will decrease HA pain to </= 4/10 with activity.  Baseline: > 7/10 Goal status: progressing 02/25/24; 03/23/24- good days and bad days   3.  Pt will increase BERG balance test score to >/= 52/56. Baseline: 48/56 Goal status: MET 54/56 02/04/24  4.  Pt will complete full session without vomiting or any other adverse reactions.  Baseline: vomited after eval Goal status: progressing 2 sessions back to back w/vomit, 2 sessions without, MET 02/10/24  5. Pt will complete SLS and tandem on firm and foam without LOB >10s   Baseline: 5s  Goal status: progressing 02/20/24 MET 03/23/24  6. Pt will be able to tolerate moderate to vigorous 45 mins PT session with minimal fatigue   Baseline: mild unsteadiness still present esp with fatigue  Goals status: INITIAL     ASSESSMENT:  CLINICAL IMPRESSION: Pt and wife were not on board with hold, despite patient continuing to show good progress. Added in a goal for 45 min of activity tolerance. They want to be able to get him back to community reintegration and driving safely. Today we really pushed him with endurance and coordination tasks. Can continue to progress  with harder and higher  levels interventions.   OBJECTIVE IMPAIRMENTS: decreased activity tolerance, decreased balance, difficulty walking, decreased safety awareness, impaired perceived functional ability, increased muscle spasms, and pain.   ACTIVITY LIMITATIONS: standing, stairs, and locomotion level  PARTICIPATION LIMITATIONS: cleaning, laundry, driving, shopping, community activity, and occupation  PERSONAL FACTORS: 1 comorbidity: history of stroke  are also affecting patient's functional outcome.   REHAB POTENTIAL: Good  CLINICAL DECISION MAKING: Evolving/moderate complexity  EVALUATION COMPLEXITY: Moderate  PLAN:  PT FREQUENCY: 1-2x/week  PT DURATION: 8 weeks  PLANNED INTERVENTIONS: 97164- PT Re-evaluation, 97110-Therapeutic exercises, 97530- Therapeutic activity, [id number]- Neuromuscular re-education, 97535- Self Care, 13244- Manual therapy, and [id number]- Gait training  PLAN FOR NEXT SESSION:   Donavon Fudge, PT, DPT 03/31/24 6:02 PM

## 2024-04-02 ENCOUNTER — Ambulatory Visit

## 2024-04-06 ENCOUNTER — Ambulatory Visit

## 2024-04-06 DIAGNOSIS — R278 Other lack of coordination: Secondary | ICD-10-CM

## 2024-04-06 DIAGNOSIS — M6281 Muscle weakness (generalized): Secondary | ICD-10-CM

## 2024-04-06 DIAGNOSIS — R42 Dizziness and giddiness: Secondary | ICD-10-CM

## 2024-04-06 DIAGNOSIS — R2689 Other abnormalities of gait and mobility: Secondary | ICD-10-CM

## 2024-04-06 NOTE — Therapy (Signed)
 OUTPATIENT PHYSICAL THERAPY NEURO TREATMENT  Progress Note Reporting Period 02/10/24 to 04/06/24  See note below for Objective Data and Assessment of Progress/Goals.      Patient Name: Wayne Lee MRN: [id number] DOB:[birth year], 48 y.o., male Today's Date: 04/06/2024   PCP: No PCP REFERRING PROVIDER: Jean Michaelis, PA-C  END OF SESSION:  PT End of Session - 04/06/24 1528     Visit Number 20    Authorization Type [employer]    PT Start Time 1530    PT Stop Time 1615    PT Time Calculation (min) 45 min    Activity Tolerance Patient tolerated treatment well    Behavior During Therapy WFL for tasks assessed/performed                             Past Medical History:  Diagnosis Date   Atopic dermatitis in adult    Left cataract    Obesity (BMI 30-39.9)    Stroke [facility])    Undescended left testicle    had surgical intervention   Past Surgical History:  Procedure Laterality Date   CATARACT EXTRACTION W/ INTRAOCULAR LENS IMPLANT Left 2017   CHOLECYSTECTOMY     ORCHIOPEXY  1984   SUBOCCIPITAL CRANIECTOMY CERVICAL LAMINECTOMY N/A 12/16/2023   Procedure: SUBOCCIPITAL CRANIECTOMY;  Surgeon: Elna Haggis, MD;  Location: [facility] OR;  Service: Neurosurgery;  Laterality: N/A;   Patient Active Problem List   Diagnosis Date Noted   Chronic fatigue 02/12/2024   Nausea and vomiting 01/13/2024   Central nervous system origin vertigo, unspecified laterality 01/13/2024   Impaired mobility and ADLs 01/13/2024   Bilateral headaches 12/31/2023   ICH (intracerebral hemorrhage) (HCC) 12/20/2023   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   Hypertension 12/19/2023   Brain compression (HCC) 12/19/2023   Nontraumatic acute cerebral hemorrhage (HCC) 12/16/2023   Cerebellar hemorrhage (HCC) 12/16/2023    ONSET DATE: 12/15/22  REFERRING DIAG:  Diagnosis  I61.9 (ICD-10-CM) - Nontraumatic acute cerebral hemorrhage (HCC)    THERAPY DIAG:  Other lack of  coordination  Muscle weakness (generalized)  Dizziness and giddiness  Other abnormalities of gait and mobility  Rationale for Evaluation and Treatment: Rehabilitation  SUBJECTIVE:                                                                                                                                                                                             SUBJECTIVE STATEMENT:  I am doing alright.   Pt accompanied by: self  PERTINENT HISTORY: 1/6 stroke, ICU and IRF leaving hospital 1/21  PAIN:  Are you  having pain? No 0/10   PRECAUTIONS: None  RED FLAGS: None   WEIGHT BEARING RESTRICTIONS: No  FALLS: Has patient fallen in last 6 months? No  LIVING ENVIRONMENT: Lives with: lives with their family and lives with their spouse Lives in: House/apartment Stairs: Yes: Internal: 15 steps; can reach both and External: 2 steps; none front door 4 steps rails on both sides Has following equipment at home: None  PLOF:  supervision for I/ADLs  PATIENT GOALS: walking, energy, endurance  OBJECTIVE:  Note: Objective measures were completed at Evaluation unless otherwise noted.  DIAGNOSTIC FINDINGS:  Nothing new  COGNITION: Overall cognitive status: Within functional limits for tasks assessed   SENSATION: WFL  COORDINATION: Toe taps and heel-to-shin normal  EDEMA:  None  MUSCLE TONE: RLE: Within functional limits  MUSCLE LENGTH: NT  POSTURE:  very wide BOS   LOWER EXTREMITY ROM:   WFL   Active  Right Eval Left Eval  Hip flexion    Hip extension    Hip abduction    Hip adduction    Hip internal rotation    Hip external rotation    Knee flexion    Knee extension    Ankle dorsiflexion    Ankle plantarflexion    Ankle inversion    Ankle eversion     (Blank rows = not tested)  LOWER EXTREMITY MMT:    MMT Right Eval Left Eval  Hip flexion 5/5 5/5  Hip extension    Hip abduction 5/5 5/5  Hip adduction 5/5 5/5  Hip internal rotation     Hip external rotation    Knee flexion 5/5 5/5  Knee extension 5/5 5/5  Ankle dorsiflexion    Ankle plantarflexion    Ankle inversion    Ankle eversion    (Blank rows = not tested)  BED MOBILITY:  Sit to supine Complete Independence and Modified independence Supine to sit Modified independence Rolling to Right Complete Independence Rolling to Left Complete Independence  TRANSFERS: Assistive device utilized: None  Sit to stand: Modified independence Stand to sit: Complete Independence Chair to chair: Modified independence Floor:  NT   STAIRS: Level of Assistance:  NT Stair Negotiation Technique:  with  Number of Stairs:   Height of Stairs:   Comments: NT  GAIT: Gait pattern: wide BOS, R lateral lean using wall to balance on the way back to room Distance walked: 50 ft Assistive device utilized: None Level of assistance: Min A with HHA from wife (pt did not want to, but was leaning) Comments: could potentially use AD  FUNCTIONAL TESTS:  5 times sit to stand: 9.26 seconds Berg Balance Scale: 48/56  PATIENT SURVEYS:  NT today                                                                                                                              TREATMENT DATE: 04/06/24 Walk outdoors 2 laps Elliptical L3 x25mins  Squat and row 20#  2x10  Shuffling  Side step into SL on BOSU x10 each side- needs bars to hold on  Plank 20s x2, plank and reach (very hard, unable to keep trunk from rotating, 4 taps at best before rolling over)   03/31/24 Elliptical L3x66mins  NuStep L5x35mins  Resisted gait with step up forward and side ways 50# x5  Treadmill pushes forward and backwards 30s x2  [employer] with blue ball 2x10  STS with 8# each arm OHP 2x10  Side steps on beam with catch  03/23/24  Nustep L6x8 minutes SPM >90 BLEs only  FGA 28/30, SLS and tandem on foam testing    Tandem stance on blue foam pad + head turns x6 rounds Tandem walks blue foam pad x4  rounds Forward lunges onto BOSU x10 B Lateral lunges onto BOSU x10 B Runners step up onto BOSU x10 B  Education on results of testing today and plan to hold PT for now, DC if we do not hear back within 30 days     03/18/24 Bike with 2 power bursts L4 x59mins  Resisted hold 50# doing cone taps, then on airex-- some LOB On BOSU catch, then multi-tasking counting backwards from 50 by 2, then counting forward by 3s  Trampoline jumps- single leg, double leg, in and out, scissoring 2x10 HS curls 45# 2x10 Leg ext 25# 2x10 Plyo pushes on leg press 20# 20s x2    03/09/24 Elliptical L5 x91mins  Resisted gait on to airex 40# x5 leading each side  On airex straight arm pulls 2x10 Quick taps 4" step  Ladder drills -quick steps in and out laterally, then 1 foot -two feet in and out of each box  -big steps skipping over squares -jumping two feet in box and out Walk outdoors 2 laps    03/04/24  Elliptical L6 x8 minutes for w/u and neural priming  Walking outside down/up hills and curbs with ball toss to self min guard Grape vines down and up hill Walking down hill forward/up backwards with ball toss to self Walking down backwards/up forwards with ball toss to self Sideways high knees + lateral step overs + BOSU kicks + backwards figure 8 walks (3 rounds) Standing on soft surface of BOSU 30 seconds + dribbling yellow weighted ball between cones + lateral alternating cone taps + step over BOSU 1 round     02/25/24 Nustep level 5 x 5 minutes keeping above 70 SPM Walking outside around building good pace, no rest, some ball [employer] kicking On rocker board 2 ways On bosu reaching for numbers On bosu ball toss On airex 15# extension On airex 10# chest press Side step on and off airex and then with ball toss Skipping Side shuffling  02/24/24:  Gait outdoors on asphalt and sidewalk, curb, for 8 min, occasional weaving, self corrects Bosu on smooth side , throwing and catching a  ball Bosu on smooth side in ll bars, reaching forward to targets, then facing side ways and reaching with same hand and across body to opposite side Resisted gait 5 x each direction 50#, lost balance more with walking backwards then change to controlling forward pace RDL's single leg, to elevated hi/low mat, 5 x each leg In ll bars for forward toe taps to cone, from 8" step In ll bars for vertical jumps x 30 sec Side weaving, over/ under, x 20' x 2 each direction, walking slowly  02/20/24 Gait outside around the back building good pace, a few times veered  to the left, but never a LOB Resisted gait all directions Patient holding me back with walking using tband Walking ball toss On bosu balance and then reaching On rockerboard balance and reaching On bosu catch On airex 15# straight arm pulls and chest press Leg press 40# 2x10 35# lats 2x10 15# 2x10 35# HS curls On airex cone toe touches, needed CGA due to LOB on this  02/13/24 Treadmill walk Seated row 35# 2x10 Lat pull down 35# 2x10 Obstacle course over obstacles, around cones, on airex, across beam, on 6" step min-modA  Catch on BOSU, both sides- harder on ball side  Leg extensions 15# 2x10 HS curls 25# 2x10 Resisted gait with step up x5 each side 30#        PATIENT EDUCATION: Education details: Diagnosis, Prognosis, POC, holding off on HEP Person educated: Patient and Spouse Education method: Explanation, Demonstration, Tactile cues, and Verbal cues Education comprehension: verbalized understanding, returned demonstration, verbal cues required, tactile cues required, and needs further education  HOME EXERCISE PROGRAM:  Access Code: [email or web address] URL: [email or web address] Date: 03/23/2024 Prepared by: Terrel Ferries  Exercises - Tandem Stance in Corner  - 1-2 x daily - 7 x weekly - 1 sets - 3 reps - 30 seconds  hold - Seated Gaze Stabilization with Head Rotation  - 1-2 x daily - 7 x weekly - 1 sets -  2 reps - 60 seconds  hold - Tandem Stance with Head Rotation  - 1 x daily - 7 x weekly - 1 sets - 4 reps - Backward Tandem Walking  - 1 x daily - 7 x weekly - 1 sets - 5 reps - Walking Tandem Stance  - 1 x daily - 7 x weekly - 1 sets - 5 reps - Lunge Onto BOSU Ball  - 1 x daily - 7 x weekly - 1-2 sets - 10 reps - Runner's Step Up on BOSU Ball  - 1 x daily - 7 x weekly - 1-2 sets - 10 reps   GOALS: Goals reviewed with patient? No  SHORT TERM GOALS: Target date: 01/25/24  Initial HEP will be prescribed by 3rd visit Baseline: not provided  Goal status: MET 01/20/24  2.  Headache pain will not increase above 7/10 during session.  Baseline: started at 7/10, pt verbalized it was worse at end of eval  Goal status: ongoing 01/21/24, MET 01/28/24  3.  Activity increase will not elicit vomiting. Baseline: vomited at end of eval after BERG testing. Goal status: ongoing 01/21/24, doing harder tasks in sessions no vomiting in 3 sessions 01/28/24, progressing 02/10/24, MET 03/09/24   LONG TERM GOALS: Target date: 04/07/24  Pt will be independent with advanced HEP, as prescribed throughout POC. Baseline: not prescribed yet Goal status: ongoing 02/20/24  2.  Pt will decrease HA pain to </= 4/10 with activity.  Baseline: > 7/10 Goal status: progressing 02/25/24; 03/23/24- good days and bad days, ongoing and still wearing patches for nausea 04/06/24  3.  Pt will increase BERG balance test score to >/= 52/56. Baseline: 48/56 Goal status: MET 54/56 02/04/24  4.  Pt will complete full session without vomiting or any other adverse reactions.  Baseline: vomited after eval Goal status: progressing 2 sessions back to back w/vomit, 2 sessions without, MET 02/10/24  5. Pt will complete SLS and tandem on firm and foam without LOB >10s   Baseline: 5s  Goal status: progressing 02/20/24 MET 03/23/24  6. Pt will be able to tolerate moderate to  vigorous 45 mins PT session with minimal fatigue   Baseline: mild  unsteadiness still present esp with fatigue  Goals status: ongoing 04/06/24     ASSESSMENT:  CLINICAL IMPRESSION: We continued to push patient with endurance and coordination tasks, adding in some multi step movements. Patient had a brief LOB with squat and row, squatted too far down and had to sit on floor. Was able to quickly get back up without any issues. Planks were very hard today, plank and reach were even more difficult. Reports a sharp headache at end of visit, 6/10, and thinks it may be from increased level of activity today. Can continue to progress with harder and higher levels interventions.    OBJECTIVE IMPAIRMENTS: decreased activity tolerance, decreased balance, difficulty walking, decreased safety awareness, impaired perceived functional ability, increased muscle spasms, and pain.   ACTIVITY LIMITATIONS: standing, stairs, and locomotion level  PARTICIPATION LIMITATIONS: cleaning, laundry, driving, shopping, community activity, and occupation  PERSONAL FACTORS: 1 comorbidity: history of stroke  are also affecting patient's functional outcome.   REHAB POTENTIAL: Good  CLINICAL DECISION MAKING: Evolving/moderate complexity  EVALUATION COMPLEXITY: Moderate  PLAN:  PT FREQUENCY: 1-2x/week  PT DURATION: 8 weeks  PLANNED INTERVENTIONS: 97164- PT Re-evaluation, 97110-Therapeutic exercises, 97530- Therapeutic activity, [id number]- Neuromuscular re-education, 97535- Self Care, 11914- Manual therapy, and [id number]- Gait training  PLAN FOR NEXT SESSION:   Donavon Fudge, PT, DPT 04/06/24 4:17 PM

## 2024-04-08 ENCOUNTER — Ambulatory Visit

## 2024-04-14 ENCOUNTER — Ambulatory Visit: Attending: Physical Medicine and Rehabilitation | Admitting: Occupational Therapy

## 2024-04-14 ENCOUNTER — Ambulatory Visit: Admitting: Physical Therapy

## 2024-04-14 DIAGNOSIS — R262 Difficulty in walking, not elsewhere classified: Secondary | ICD-10-CM | POA: Insufficient documentation

## 2024-04-14 DIAGNOSIS — R41844 Frontal lobe and executive function deficit: Secondary | ICD-10-CM | POA: Insufficient documentation

## 2024-04-14 DIAGNOSIS — R278 Other lack of coordination: Secondary | ICD-10-CM | POA: Insufficient documentation

## 2024-04-14 DIAGNOSIS — M6281 Muscle weakness (generalized): Secondary | ICD-10-CM | POA: Insufficient documentation

## 2024-04-14 DIAGNOSIS — R2689 Other abnormalities of gait and mobility: Secondary | ICD-10-CM | POA: Insufficient documentation

## 2024-04-14 DIAGNOSIS — R41842 Visuospatial deficit: Secondary | ICD-10-CM | POA: Insufficient documentation

## 2024-04-14 DIAGNOSIS — R42 Dizziness and giddiness: Secondary | ICD-10-CM | POA: Insufficient documentation

## 2024-04-20 ENCOUNTER — Ambulatory Visit: Admitting: Physical Therapy

## 2024-04-20 ENCOUNTER — Encounter: Payer: Self-pay | Admitting: Physical Therapy

## 2024-04-20 DIAGNOSIS — R41842 Visuospatial deficit: Secondary | ICD-10-CM | POA: Diagnosis present

## 2024-04-20 DIAGNOSIS — R278 Other lack of coordination: Secondary | ICD-10-CM

## 2024-04-20 DIAGNOSIS — R262 Difficulty in walking, not elsewhere classified: Secondary | ICD-10-CM

## 2024-04-20 DIAGNOSIS — R2689 Other abnormalities of gait and mobility: Secondary | ICD-10-CM

## 2024-04-20 DIAGNOSIS — M6281 Muscle weakness (generalized): Secondary | ICD-10-CM | POA: Diagnosis present

## 2024-04-20 DIAGNOSIS — R42 Dizziness and giddiness: Secondary | ICD-10-CM | POA: Diagnosis present

## 2024-04-20 DIAGNOSIS — R41844 Frontal lobe and executive function deficit: Secondary | ICD-10-CM | POA: Diagnosis present

## 2024-04-20 NOTE — Therapy (Signed)
 OUTPATIENT PHYSICAL THERAPY NEURO TREATMENT  Progress Note Reporting Period 02/10/24 to 04/06/24  See note below for Objective Data and Assessment of Progress/Goals.      Patient Name: Wayne Lee MRN: [id number] DOB:06/17/1976, 48 y.o., male Today's Date: 04/20/2024   PCP: No PCP REFERRING PROVIDER: Jean Michaelis, PA-C  END OF SESSION:  PT End of Session - 04/20/24 1524     Visit Number 21    Date for PT Re-Evaluation 06/20/24    Authorization Type [employer]    PT Start Time 1525    PT Stop Time 1610    PT Time Calculation (min) 45 min    Activity Tolerance Patient tolerated treatment well    Behavior During Therapy WFL for tasks assessed/performed                             Past Medical History:  Diagnosis Date   Atopic dermatitis in adult    Left cataract    Obesity (BMI 30-39.9)    Stroke [facility])    Undescended left testicle    had surgical intervention   Past Surgical History:  Procedure Laterality Date   CATARACT EXTRACTION W/ INTRAOCULAR LENS IMPLANT Left 2017   CHOLECYSTECTOMY     ORCHIOPEXY  1984   SUBOCCIPITAL CRANIECTOMY CERVICAL LAMINECTOMY N/A 12/16/2023   Procedure: SUBOCCIPITAL CRANIECTOMY;  Surgeon: Elna Haggis, MD;  Location: [facility] OR;  Service: Neurosurgery;  Laterality: N/A;   Patient Active Problem List   Diagnosis Date Noted   Chronic fatigue 02/12/2024   Nausea and vomiting 01/13/2024   Central nervous system origin vertigo, unspecified laterality 01/13/2024   Impaired mobility and ADLs 01/13/2024   Bilateral headaches 12/31/2023   ICH (intracerebral hemorrhage) (HCC) 12/20/2023   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   Hypertension 12/19/2023   Brain compression (HCC) 12/19/2023   Nontraumatic acute cerebral hemorrhage (HCC) 12/16/2023   Cerebellar hemorrhage (HCC) 12/16/2023    ONSET DATE: 12/15/22  REFERRING DIAG:  Diagnosis  I61.9 (ICD-10-CM) - Nontraumatic acute cerebral hemorrhage  (HCC)    THERAPY DIAG:  Other lack of coordination  Muscle weakness (generalized)  Dizziness and giddiness  Other abnormalities of gait and mobility  Difficulty in walking, not elsewhere classified  Rationale for Evaluation and Treatment: Rehabilitation  SUBJECTIVE:                                                                                                                                                                                             SUBJECTIVE STATEMENT:  Patient reports that he still has some issues and fear walking  alone out side, feels like he veers off and catches his toe at times  Pt accompanied by: self  PERTINENT HISTORY: 1/6 stroke, ICU and IRF leaving hospital 1/21  PAIN:  Are you having pain? No 0/10   PRECAUTIONS: None  RED FLAGS: None   WEIGHT BEARING RESTRICTIONS: No  FALLS: Has patient fallen in last 6 months? No  LIVING ENVIRONMENT: Lives with: lives with their family and lives with their spouse Lives in: House/apartment Stairs: Yes: Internal: 15 steps; can reach both and External: 2 steps; none front door 4 steps rails on both sides Has following equipment at home: None  PLOF: supervision for I/ADLs  PATIENT GOALS: walking, energy, endurance  OBJECTIVE:  Note: Objective measures were completed at Evaluation unless otherwise noted.  DIAGNOSTIC FINDINGS:  Nothing new  COGNITION: Overall cognitive status: Within functional limits for tasks assessed   SENSATION: WFL  COORDINATION: Toe taps and heel-to-shin normal  EDEMA:  None  MUSCLE TONE: RLE: Within functional limits  MUSCLE LENGTH: NT  POSTURE: very wide BOS   LOWER EXTREMITY ROM:   WFL   Active  Right Eval Left Eval  Hip flexion    Hip extension    Hip abduction    Hip adduction    Hip internal rotation    Hip external rotation    Knee flexion    Knee extension    Ankle dorsiflexion    Ankle plantarflexion    Ankle inversion    Ankle eversion      (Blank rows = not tested)  LOWER EXTREMITY MMT:    MMT Right Eval Left Eval  Hip flexion 5/5 5/5  Hip extension    Hip abduction 5/5 5/5  Hip adduction 5/5 5/5  Hip internal rotation    Hip external rotation    Knee flexion 5/5 5/5  Knee extension 5/5 5/5  Ankle dorsiflexion    Ankle plantarflexion    Ankle inversion    Ankle eversion    (Blank rows = not tested)  BED MOBILITY:  Sit to supine Complete Independence and Modified independence Supine to sit Modified independence Rolling to Right Complete Independence Rolling to Left Complete Independence  TRANSFERS: Assistive device utilized: None  Sit to stand: Modified independence Stand to sit: Complete Independence Chair to chair: Modified independence Floor: NT   STAIRS: Level of Assistance: NT Stair Negotiation Technique:  with  Number of Stairs:   Height of Stairs:   Comments: NT  GAIT: Gait pattern: wide BOS, R lateral lean using wall to balance on the way back to room Distance walked: 50 ft Assistive device utilized: None Level of assistance: Min A with HHA from wife (pt did not want to, but was leaning) Comments: could potentially use AD  FUNCTIONAL TESTS:  5 times sit to stand: 9.26 seconds Berg Balance Scale: 48/56  PATIENT SURVEYS:  NT today  TREATMENT DATE: 04/20/24 Nustep level 5 x 5 minutes Bike Level 5 x 5 minutes power burst Tmill push 20 seconds x 3  Skipping Side to side jumping  Front to back jumping Bosu step ups Mat step ups [employer] Side shuffles On bosu reaching, ball toss  04/06/24 Walk outdoors 2 laps Elliptical L3 x42mins  Squat and row 20# 2x10  Shuffling  Side step into SL on BOSU x10 each side- needs bars to hold on  Plank 20s x2, plank and reach (very hard, unable to keep trunk from rotating, 4 taps at best before rolling over)    03/31/24 Elliptical L3x90mins  NuStep L5x21mins  Resisted gait with step up forward and side ways 50# x5  Treadmill pushes forward and backwards 30s x2  [employer] with blue ball 2x10  STS with 8# each arm OHP 2x10  Side steps on beam with catch  03/23/24  Nustep L6x8 minutes SPM >90 BLEs only  FGA 28/30, SLS and tandem on foam testing    Tandem stance on blue foam pad + head turns x6 rounds Tandem walks blue foam pad x4 rounds Forward lunges onto BOSU x10 B Lateral lunges onto BOSU x10 B Runners step up onto BOSU x10 B  Education on results of testing today and plan to hold PT for now, DC if we do not hear back within 30 days     03/18/24 Bike with 2 power bursts L4 x50mins  Resisted hold 50# doing cone taps, then on airex-- some LOB On BOSU catch, then multi-tasking counting backwards from 50 by 2, then counting forward by 3s  Trampoline jumps- single leg, double leg, in and out, scissoring 2x10 HS curls 45# 2x10 Leg ext 25# 2x10 Plyo pushes on leg press 20# 20s x2    03/09/24 Elliptical L5 x31mins  Resisted gait on to airex 40# x5 leading each side  On airex straight arm pulls 2x10 Quick taps 4" step  Ladder drills -quick steps in and out laterally, then 1 foot -two feet in and out of each box  -big steps skipping over squares -jumping two feet in box and out Walk outdoors 2 laps    03/04/24  Elliptical L6 x8 minutes for w/u and neural priming  Walking outside down/up hills and curbs with ball toss to self min guard Grape vines down and up hill Walking down hill forward/up backwards with ball toss to self Walking down backwards/up forwards with ball toss to self Sideways high knees + lateral step overs + BOSU kicks + backwards figure 8 walks (3 rounds) Standing on soft surface of BOSU 30 seconds + dribbling yellow weighted ball between cones + lateral alternating cone taps + step over BOSU 1 round     02/25/24 Nustep level 5 x 5 minutes keeping above 70  SPM Walking outside around building good pace, no rest, some ball [employer] kicking On rocker board 2 ways On bosu reaching for numbers On bosu ball toss On airex 15# extension On airex 10# chest press Side step on and off airex and then with ball toss Skipping Side shuffling  02/24/24:  Gait outdoors on asphalt and sidewalk, curb, for 8 min, occasional weaving, self corrects Bosu on smooth side , throwing and catching a ball Bosu on smooth side in ll bars, reaching forward to targets, then facing side ways and reaching with same hand and across body to opposite side Resisted gait 5 x each direction 50#, lost balance more with walking backwards then  change to controlling forward pace RDL's single leg, to elevated hi/low mat, 5 x each leg In ll bars for forward toe taps to cone, from 8" step In ll bars for vertical jumps x 30 sec Side weaving, over/ under, x 20' x 2 each direction, walking slowly PATIENT EDUCATION: Education details: Diagnosis, Prognosis, POC, holding off on HEP Person educated: Patient and Spouse Education method: Explanation, Demonstration, Tactile cues, and Verbal cues Education comprehension: verbalized understanding, returned demonstration, verbal cues required, tactile cues required, and needs further education  HOME EXERCISE PROGRAM:  Access Code: [email or web address] URL: [email or web address] Date: 03/23/2024 Prepared by: Terrel Ferries  Exercises - Tandem Stance in Corner  - 1-2 x daily - 7 x weekly - 1 sets - 3 reps - 30 seconds  hold - Seated Gaze Stabilization with Head Rotation  - 1-2 x daily - 7 x weekly - 1 sets - 2 reps - 60 seconds  hold - Tandem Stance with Head Rotation  - 1 x daily - 7 x weekly - 1 sets - 4 reps - Backward Tandem Walking  - 1 x daily - 7 x weekly - 1 sets - 5 reps - Walking Tandem Stance  - 1 x daily - 7 x weekly - 1 sets - 5 reps - Lunge Onto BOSU Ball  - 1 x daily - 7 x weekly - 1-2 sets - 10 reps - Runner's Step Up  on BOSU Ball  - 1 x daily - 7 x weekly - 1-2 sets - 10 reps   GOALS: Goals reviewed with patient? No  SHORT TERM GOALS: Target date: 01/25/24  Initial HEP will be prescribed by 3rd visit Baseline: not provided  Goal status: MET 01/20/24  2.  Headache pain will not increase above 7/10 during session.  Baseline: started at 7/10, pt verbalized it was worse at end of eval  Goal status: ongoing 01/21/24, MET 01/28/24  3.  Activity increase will not elicit vomiting. Baseline: vomited at end of eval after BERG testing. Goal status: ongoing 01/21/24, doing harder tasks in sessions no vomiting in 3 sessions 01/28/24, progressing 02/10/24, MET 03/09/24   LONG TERM GOALS: Target date: 04/07/24  Pt will be independent with advanced HEP, as prescribed throughout POC. Baseline: not prescribed yet Goal status: ongoing 02/20/24  2.  Pt will decrease HA pain to </= 4/10 with activity.  Baseline: > 7/10 Goal status: progressing 02/25/24; 03/23/24- good days and bad days, ongoing and still wearing patches for nausea 04/06/24  3.  Pt will increase BERG balance test score to >/= 52/56. Baseline: 48/56 Goal status: MET 54/56 02/04/24  4.  Pt will complete full session without vomiting or any other adverse reactions.  Baseline: vomited after eval Goal status: progressing 2 sessions back to back w/vomit, 2 sessions without, MET 02/10/24  5. Pt will complete SLS and tandem on firm and foam without LOB >10s   Baseline: 5s  Goal status: progressing 02/20/24 MET 03/23/24  6. Pt will be able to tolerate moderate to vigorous 45 mins PT session with minimal fatigue   Baseline: mild unsteadiness still present esp with fatigue  Goals status: ongoing 04/06/24     ASSESSMENT:  CLINICAL IMPRESSION: We continued to push patient with endurance and coordination tasks, adding in some multi step movements. Patient at times will be off balance and catch his feet.  He denies any falls, but reports that he is afraid to walk  much without someone with him.  He reports that he would  like to continue to help improve this, as well get stronger  OBJECTIVE IMPAIRMENTS: decreased activity tolerance, decreased balance, difficulty walking, decreased safety awareness, impaired perceived functional ability, increased muscle spasms, and pain.   ACTIVITY LIMITATIONS: standing, stairs, and locomotion level  PARTICIPATION LIMITATIONS: cleaning, laundry, driving, shopping, community activity, and occupation  PERSONAL FACTORS: 1 comorbidity: history of stroke are also affecting patient's functional outcome.   REHAB POTENTIAL: Good  CLINICAL DECISION MAKING: Evolving/moderate complexity  EVALUATION COMPLEXITY: Moderate  PLAN:  PT FREQUENCY: 1-2x/week  PT DURATION: 8 weeks  PLANNED INTERVENTIONS: 97164- PT Re-evaluation, 97110-Therapeutic exercises, 97530- Therapeutic activity, [id number]- Neuromuscular re-education, 97535- Self Care, [id number]- Manual therapy, and 97116- Gait training  PLAN FOR NEXT SESSION: renewal done, will continue 1x/week or every other week to maximize balance and function  Cherylene Corrente, PT 04/20/24 3:26 PM

## 2024-04-27 ENCOUNTER — Encounter: Payer: Self-pay | Admitting: Physical Therapy

## 2024-04-27 ENCOUNTER — Ambulatory Visit: Admitting: Occupational Therapy

## 2024-04-27 ENCOUNTER — Ambulatory Visit: Admitting: Physical Therapy

## 2024-04-27 ENCOUNTER — Encounter: Payer: Self-pay | Admitting: Occupational Therapy

## 2024-04-27 ENCOUNTER — Other Ambulatory Visit: Payer: Self-pay

## 2024-04-27 DIAGNOSIS — M6281 Muscle weakness (generalized): Secondary | ICD-10-CM

## 2024-04-27 DIAGNOSIS — R2689 Other abnormalities of gait and mobility: Secondary | ICD-10-CM

## 2024-04-27 DIAGNOSIS — R41844 Frontal lobe and executive function deficit: Secondary | ICD-10-CM

## 2024-04-27 DIAGNOSIS — R41842 Visuospatial deficit: Secondary | ICD-10-CM

## 2024-04-27 DIAGNOSIS — R278 Other lack of coordination: Secondary | ICD-10-CM | POA: Diagnosis not present

## 2024-04-27 DIAGNOSIS — R42 Dizziness and giddiness: Secondary | ICD-10-CM

## 2024-04-27 DIAGNOSIS — R262 Difficulty in walking, not elsewhere classified: Secondary | ICD-10-CM

## 2024-04-27 NOTE — Patient Instructions (Addendum)
(  Exercise) Monday Tuesday Wednesday Thursday Friday Saturday Sunday    mins standing or up on feet           shoulder ex and wrist exercise with weight less than 5 lbs           PT exercises           walking          # of drops per day                                                          AROM: Wrist Extension   .  With _left___ palm down, bend wrist up.hold 2 lbs weight Repeat __15__ times per set.  Do __1__ sessions per day.    AROM: Wrist Flexion   With__left___ palm up, bend wrist up. Repeat __15__ times per set.  Do ____ sessions per day.   AROM: Forearm Pronation / Supination   With __left__ arm in handshake position, slowly rotate palm down until stretch is felt. Relax. Then rotate palm up until stretch is felt. Repeat _15___ times per set. Do _1__ sessions per day. hold 2 lbs weight  Copyright  VHI. All rights reserved.    Strengthening: Scaption - with External Rotation    Holding _2-3___ pound weight, raise right arm diagonally from hip to above head. Keep elbow straight, thumb up. Repeat _10___ times per set. Do ___1_ sets per session. Do _1___ sessions per day.  http://orth.exer.us /892   Copyright  VHI. All rights reserved.  SHOULDER: Abduction (Weight)    Raise arm out and up. Keep elbow straight. Do not shrug shoulders. Hold __5_ seconds. Use _2__ lb weight. _10__ reps per set, _1__ sets per day, __7_ days per week Use _2-3__ lb dumbbell.  Copyright  VHI. All rights reserved.

## 2024-04-27 NOTE — Therapy (Signed)
 OUTPATIENT PHYSICAL THERAPY NEURO TREATMENT    Patient Name: Wayne Lee MRN: [id number] DOB:[birth year], 48 y.o., male Today's Date: 04/27/2024   PCP: No PCP REFERRING PROVIDER: Jean Michaelis, PA-C  END OF SESSION:  PT End of Session - 04/27/24 1617     Visit Number 22    Date for PT Re-Evaluation 06/20/24    Authorization Type [employer]    PT Start Time [phone]    PT Stop Time 1700    PT Time Calculation (min) 43 min    Activity Tolerance Patient tolerated treatment well    Behavior During Therapy WFL for tasks assessed/performed                             Past Medical History:  Diagnosis Date   Atopic dermatitis in adult    Left cataract    Obesity (BMI 30-39.9)    Stroke [facility])    Undescended left testicle    had surgical intervention   Past Surgical History:  Procedure Laterality Date   CATARACT EXTRACTION W/ INTRAOCULAR LENS IMPLANT Left 2017   CHOLECYSTECTOMY     ORCHIOPEXY  1984   SUBOCCIPITAL CRANIECTOMY CERVICAL LAMINECTOMY N/A 12/16/2023   Procedure: SUBOCCIPITAL CRANIECTOMY;  Surgeon: Elna Haggis, MD;  Location: [facility] OR;  Service: Neurosurgery;  Laterality: N/A;   Patient Active Problem List   Diagnosis Date Noted   Chronic fatigue 02/12/2024   Nausea and vomiting 01/13/2024   Central nervous system origin vertigo, unspecified laterality 01/13/2024   Impaired mobility and ADLs 01/13/2024   Bilateral headaches 12/31/2023   ICH (intracerebral hemorrhage) (HCC) 12/20/2023   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   Hypertension 12/19/2023   Brain compression (HCC) 12/19/2023   Nontraumatic acute cerebral hemorrhage (HCC) 12/16/2023   Cerebellar hemorrhage (HCC) 12/16/2023    ONSET DATE: 12/15/22  REFERRING DIAG:  Diagnosis  I61.9 (ICD-10-CM) - Nontraumatic acute cerebral hemorrhage (HCC)    THERAPY DIAG:  Muscle weakness (generalized)  Dizziness and giddiness  Difficulty in walking, not elsewhere  classified  Visuospatial deficit  Rationale for Evaluation and Treatment: Rehabilitation  SUBJECTIVE:                                                                                                                                                                                             SUBJECTIVE STATEMENT:  Doing okay, reports when he is tired he is more unsteady and brain foggy  Pt accompanied by: self  PERTINENT HISTORY: 1/6 stroke, ICU and IRF leaving hospital 1/21  PAIN:  Are you having pain? No 0/10  PRECAUTIONS: None  RED FLAGS: None   WEIGHT BEARING RESTRICTIONS: No  FALLS: Has patient fallen in last 6 months? No  LIVING ENVIRONMENT: Lives with: lives with their family and lives with their spouse Lives in: House/apartment Stairs: Yes: Internal: 15 steps; can reach both and External: 2 steps; none front door 4 steps rails on both sides Has following equipment at home: None  PLOF: supervision for I/ADLs  PATIENT GOALS: walking, energy, endurance  OBJECTIVE:  Note: Objective measures were completed at Evaluation unless otherwise noted.  DIAGNOSTIC FINDINGS:  Nothing new  COGNITION: Overall cognitive status: Within functional limits for tasks assessed   SENSATION: WFL  COORDINATION: Toe taps and heel-to-shin normal  EDEMA:  None  MUSCLE TONE: RLE: Within functional limits  MUSCLE LENGTH: NT  POSTURE: very wide BOS   LOWER EXTREMITY ROM:   WFL   Active  Right Eval Left Eval  Hip flexion    Hip extension    Hip abduction    Hip adduction    Hip internal rotation    Hip external rotation    Knee flexion    Knee extension    Ankle dorsiflexion    Ankle plantarflexion    Ankle inversion    Ankle eversion     (Blank rows = not tested)  LOWER EXTREMITY MMT:    MMT Right Eval Left Eval  Hip flexion 5/5 5/5  Hip extension    Hip abduction 5/5 5/5  Hip adduction 5/5 5/5  Hip internal rotation    Hip external rotation    Knee  flexion 5/5 5/5  Knee extension 5/5 5/5  Ankle dorsiflexion    Ankle plantarflexion    Ankle inversion    Ankle eversion    (Blank rows = not tested)  BED MOBILITY:  Sit to supine Complete Independence and Modified independence Supine to sit Modified independence Rolling to Right Complete Independence Rolling to Left Complete Independence  TRANSFERS: Assistive device utilized: None  Sit to stand: Modified independence Stand to sit: Complete Independence Chair to chair: Modified independence Floor: NT   STAIRS: Level of Assistance: NT Stair Negotiation Technique:  with  Number of Stairs:   Height of Stairs:   Comments: NT  GAIT: Gait pattern: wide BOS, R lateral lean using wall to balance on the way back to room Distance walked: 50 ft Assistive device utilized: None Level of assistance: Min A with HHA from wife (pt did not want to, but was leaning) Comments: could potentially use AD  FUNCTIONAL TESTS:  5 times sit to stand: 9.26 seconds Berg Balance Scale: 48/56  PATIENT SURVEYS:  NT today                                                                                                                              TREATMENT DATE: 04/27/24 Elliptical LEvel 5 x 3 minutes Gait outside with 4# biceps, OHP and punches On bosu 2 ways ball toss Resisted gait  all directions 5# SLS DL on and off airex with CGA and hand on wall. 10# holding overhead in single arm STS SLS ball toss Small jumps, some questions when HR is elevated like math and state capitals etc.. HR up to 160 bpm  04/20/24 Nustep level 5 x 5 minutes Bike Level 5 x 5 minutes power burst Tmill push 20 seconds x 3  Skipping Side to side jumping  Front to back jumping Bosu step ups Mat step ups [employer] Side shuffles On bosu reaching, ball toss  04/06/24 Walk outdoors 2 laps Elliptical L3 x19mins  Squat and row 20# 2x10  Shuffling  Side step into SL on BOSU x10 each side- needs bars to hold on   Plank 20s x2, plank and reach (very hard, unable to keep trunk from rotating, 4 taps at best before rolling over)   03/31/24 Elliptical L3x64mins  NuStep L5x79mins  Resisted gait with step up forward and side ways 50# x5  Treadmill pushes forward and backwards 30s x2  [employer] with blue ball 2x10  STS with 8# each arm OHP 2x10  Side steps on beam with catch  03/23/24  Nustep L6x8 minutes SPM >90 BLEs only  FGA 28/30, SLS and tandem on foam testing    Tandem stance on blue foam pad + head turns x6 rounds Tandem walks blue foam pad x4 rounds Forward lunges onto BOSU x10 B Lateral lunges onto BOSU x10 B Runners step up onto BOSU x10 B  Education on results of testing today and plan to hold PT for now, DC if we do not hear back within 30 days     03/18/24 Bike with 2 power bursts L4 x75mins  Resisted hold 50# doing cone taps, then on airex-- some LOB On BOSU catch, then multi-tasking counting backwards from 50 by 2, then counting forward by 3s  Trampoline jumps- single leg, double leg, in and out, scissoring 2x10 HS curls 45# 2x10 Leg ext 25# 2x10 Plyo pushes on leg press 20# 20s x2    03/09/24 Elliptical L5 x79mins  Resisted gait on to airex 40# x5 leading each side  On airex straight arm pulls 2x10 Quick taps 4" step  Ladder drills -quick steps in and out laterally, then 1 foot -two feet in and out of each box  -big steps skipping over squares -jumping two feet in box and out Walk outdoors 2 laps    03/04/24  Elliptical L6 x8 minutes for w/u and neural priming  Walking outside down/up hills and curbs with ball toss to self min guard Grape vines down and up hill Walking down hill forward/up backwards with ball toss to self Walking down backwards/up forwards with ball toss to self Sideways high knees + lateral step overs + BOSU kicks + backwards figure 8 walks (3 rounds) Standing on soft surface of BOSU 30 seconds + dribbling yellow weighted ball between cones +  lateral alternating cone taps + step over BOSU 1 round     02/25/24 Nustep level 5 x 5 minutes keeping above 70 SPM Walking outside around building good pace, no rest, some ball [employer] kicking On rocker board 2 ways On bosu reaching for numbers On bosu ball toss On airex 15# extension On airex 10# chest press Side step on and off airex and then with ball toss Skipping Side shuffling  02/24/24:  Gait outdoors on asphalt and sidewalk, curb, for 8 min, occasional weaving, self corrects Bosu on smooth side , throwing and  catching a ball Bosu on smooth side in ll bars, reaching forward to targets, then facing side ways and reaching with same hand and across body to opposite side Resisted gait 5 x each direction 50#, lost balance more with walking backwards then change to controlling forward pace RDL's single leg, to elevated hi/low mat, 5 x each leg In ll bars for forward toe taps to cone, from 8" step In ll bars for vertical jumps x 30 sec Side weaving, over/ under, x 20' x 2 each direction, walking slowly PATIENT EDUCATION: Education details: Diagnosis, Prognosis, POC, holding off on HEP Person educated: Patient and Spouse Education method: Explanation, Demonstration, Tactile cues, and Verbal cues Education comprehension: verbalized understanding, returned demonstration, verbal cues required, tactile cues required, and needs further education  HOME EXERCISE PROGRAM:  Access Code: [email or web address] URL: [email or web address] Date: 03/23/2024 Prepared by: Terrel Ferries  Exercises - Tandem Stance in Corner  - 1-2 x daily - 7 x weekly - 1 sets - 3 reps - 30 seconds  hold - Seated Gaze Stabilization with Head Rotation  - 1-2 x daily - 7 x weekly - 1 sets - 2 reps - 60 seconds  hold - Tandem Stance with Head Rotation  - 1 x daily - 7 x weekly - 1 sets - 4 reps - Backward Tandem Walking  - 1 x daily - 7 x weekly - 1 sets - 5 reps - Walking Tandem Stance  - 1 x daily - 7 x  weekly - 1 sets - 5 reps - Lunge Onto BOSU Ball  - 1 x daily - 7 x weekly - 1-2 sets - 10 reps - Runner's Step Up on BOSU Ball  - 1 x daily - 7 x weekly - 1-2 sets - 10 reps   GOALS: Goals reviewed with patient? No  SHORT TERM GOALS: Target date: 01/25/24  Initial HEP will be prescribed by 3rd visit Baseline: not provided  Goal status: MET 01/20/24  2.  Headache pain will not increase above 7/10 during session.  Baseline: started at 7/10, pt verbalized it was worse at end of eval  Goal status: ongoing 01/21/24, MET 01/28/24  3.  Activity increase will not elicit vomiting. Baseline: vomited at end of eval after BERG testing. Goal status: ongoing 01/21/24, doing harder tasks in sessions no vomiting in 3 sessions 01/28/24, progressing 02/10/24, MET 03/09/24   LONG TERM GOALS: Target date: 04/07/24  Pt will be independent with advanced HEP, as prescribed throughout POC. Baseline: not prescribed yet Goal status: ongoing 02/20/24  2.  Pt will decrease HA pain to </= 4/10 with activity.  Baseline: > 7/10 Goal status: progressing 02/25/24; 03/23/24- good days and bad days, ongoing and still wearing patches for nausea 04/06/24  3.  Pt will increase BERG balance test score to >/= 52/56. Baseline: 48/56 Goal status: MET 54/56 02/04/24  4.  Pt will complete full session without vomiting or any other adverse reactions.  Baseline: vomited after eval Goal status: progressing 2 sessions back to back w/vomit, 2 sessions without, MET 02/10/24  5. Pt will complete SLS and tandem on firm and foam without LOB >10s   Baseline: 5s  Goal status: progressing 02/20/24 MET 03/23/24  6. Pt will be able to tolerate moderate to vigorous 45 mins PT session with minimal fatigue   Baseline: mild unsteadiness still present esp with fatigue  Goals status: ongoing 04/27/24    ASSESSMENT:  CLINICAL IMPRESSION: We continued to push patient with endurance and coordination tasks,  adding in some multi step movements.Got  his HR up to 160 bpm, when fatigued he typically has some times of being off balance or just a little less safe, requiring at times CGA  OBJECTIVE IMPAIRMENTS: decreased activity tolerance, decreased balance, difficulty walking, decreased safety awareness, impaired perceived functional ability, increased muscle spasms, and pain.   ACTIVITY LIMITATIONS: standing, stairs, and locomotion level  PARTICIPATION LIMITATIONS: cleaning, laundry, driving, shopping, community activity, and occupation  PERSONAL FACTORS: 1 comorbidity: history of stroke are also affecting patient's functional outcome.   REHAB POTENTIAL: Good  CLINICAL DECISION MAKING: Evolving/moderate complexity  EVALUATION COMPLEXITY: Moderate  PLAN:  PT FREQUENCY: 1-2x/week  PT DURATION: 8 weeks  PLANNED INTERVENTIONS: 97164- PT Re-evaluation, 97110-Therapeutic exercises, 97530- Therapeutic activity, 97112- Neuromuscular re-education, 97535- Self Care, 16109- Manual therapy, and 97116- Gait training  PLAN FOR NEXT SESSION: renewal done, will continue 1x/week or every other week to maximize balance and function, work with elevated HR for higher level balance and cognition  Cherylene Corrente, PT 04/27/24 4:18 PM

## 2024-04-27 NOTE — Therapy (Signed)
 OUTPATIENT OCCUPATIONAL THERAPY NEURO Treatment  Patient Name: Wayne Lee MRN: [id number] DOB:1976/04/23, 48 y.o., male Today's Date: 04/27/2024  PCP: none REFERRING PROVIDER: Dr. Dorn Gaskins  END OF SESSION:  OT End of Session - 04/27/24 1535     Visit Number 13    Number of Visits 16    Date for OT Re-Evaluation 05/28/24    Authorization Type aetna    Authorization Time Period 12 weeks    OT Start Time 1533    OT Stop Time 1615    OT Time Calculation (min) 42 min    Activity Tolerance Patient tolerated treatment well    Behavior During Therapy WFL for tasks assessed/performed                     Past Medical History:  Diagnosis Date   Atopic dermatitis in adult    Left cataract    Obesity (BMI 30-39.9)    Stroke [facility])    Undescended left testicle    had surgical intervention   Past Surgical History:  Procedure Laterality Date   CATARACT EXTRACTION W/ INTRAOCULAR LENS IMPLANT Left 2017   CHOLECYSTECTOMY     ORCHIOPEXY  1984   SUBOCCIPITAL CRANIECTOMY CERVICAL LAMINECTOMY N/A 12/16/2023   Procedure: SUBOCCIPITAL CRANIECTOMY;  Surgeon: Elna Haggis, MD;  Location: [facility] OR;  Service: Neurosurgery;  Laterality: N/A;   Patient Active Problem List   Diagnosis Date Noted   Chronic fatigue 02/12/2024   Nausea and vomiting 01/13/2024   Central nervous system origin vertigo, unspecified laterality 01/13/2024   Impaired mobility and ADLs 01/13/2024   Bilateral headaches 12/31/2023   ICH (intracerebral hemorrhage) (HCC) 12/20/2023   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   Hypertension 12/19/2023   Brain compression (HCC) 12/19/2023   Nontraumatic acute cerebral hemorrhage (HCC) 12/16/2023   Cerebellar hemorrhage (HCC) 12/16/2023    ONSET DATE: 12/16/23  REFERRING DIAG: I61.9 (ICD-10-CM) - Nontraumatic acute cerebral hemorrhage (HCC)   THERAPY DIAG:  No diagnosis found.  Rationale for Evaluation and Treatment: Rehabilitation  SUBJECTIVE:    SUBJECTIVE STATEMENT: Pt reports dropping items at home Pt accompanied by: significant other  PERTINENT HISTORY: 48 yo male arrival 1/6 with HTN 204/106 taken to emergent OR hematoma evacuation s/p suboccipital craniotomy and hypertonic saline started. Extubated 1/7 PMH obesity, sleep apnea, HLD d/c home 1/21/5 Per pt's wife AVM  CT Head without contrast: Large acute 5.3 cm intraparenchymal hemorrhage in the left cerebellum. Mass effect with narrowed fourth ventricle, but no hydrocephalus at this time. Basal cisterns are effaced and there is early ascending transtentorial herniation. PRECAUTIONS: Fall  WEIGHT BEARING RESTRICTIONS: No  PAIN: no pain today FALLS: Has patient fallen in last 6 months? No  LIVING ENVIRONMENT: Lives with: lives with their family and lives with their spouse Lives in: House/apartment Stairs: yes  Has following equipment at home: None  PLOF: Independent  PATIENT GOALS: return to prior level  OBJECTIVE:  Note: Objective measures were completed at Evaluation unless otherwise noted.  HAND DOMINANCE: Right  ADLs: Overall ADLs: mod I-supervision with basic ADLS Transfers/ambulation related to ADLs: Eating: mod I Grooming: mod I UB Dressing: mod I LB Dressing: mod I Toileting: mod I Bathing: supervision, sits in bottom of bathtub Tub Shower transfers: supervision   IADLs:dependent for IADLS Medication management: wife is Teacher, English as a foreign language: dependent   MOBILITY STATUS: mod I-supervision    ACTIVITY TOLERANCE: Activity tolerance: limited by nausea   UPPER EXTREMITY ROM:  WFLS   UPPER EXTREMITY MMT:  MMT Right eval Left eval  Shoulder flexion 4+/5 4/5  Shoulder abduction    Shoulder adduction    Shoulder extension    Shoulder internal rotation    Shoulder external rotation    Middle trapezius    Lower trapezius    Elbow flexion 4+/5 4+/5  Elbow extension 4+/5 4/5  Wrist flexion    Wrist extension     Wrist ulnar deviation    Wrist radial deviation    Wrist pronation    Wrist supination    (Blank rows = not tested)  HAND FUNCTION: Grip strength: Right: 63 lbs; Left: 48 lbs  COORDINATION: 9 Hole Peg test: Right: 26.25 sec; Left: 32.80 with several drops sec  SENSATION: WFL  COGNITION: Overall cognitive status: recalls 3/3 words following short delay, spells WORLD backwards without difficulty  VISION: Subjective report: Pt reports double vision at times   VISION ASSESSMENT: To be further assessed in functional context Pt reports diplopia at times He was able to track to all 4 quadrants and visual fiels were intact. He became nauseous and vomited shortly after vision testing. Pt appears to have some vestibular issues.  Patient has difficulty with following activities due to following visual impairments: reading    OBSERVATIONS: Pleasant agreeable male, accompanied by his wife and 47 y.o son Pt's BP was elevated at end of session when he was vomiting 140/102, and 148/115, pt was instructed to monitor at home and to contact MD if diastolic BP does not reduce to below 100. pt's wife verbalized understanding                                                                                                                             TREATMENT DATE:04/27/24-Discussed progress towards goals.  Pt reports he continues to drop items at home. Pt ambulated 150 ft while carrying simulated plate of food(marbles) in left hand and glass of water in right hand. Pt switched hands for carrying the items. Pt was able to carry without dropping. Pt was instructed in an updated HEP for LUE control with a 2 lbs weight, for strengthening left wrist and then shoulder flexion and abduction focusing on contol. min v.c Pt practiced touching overhead shelf with weight then bring back down to countertop, with LUE min difficulty/ v.c for control. Discussion regarding importance of increasing activity level  at home and use of an exercise flow sheet.   03/26/24- Therapist checked progress towards goals, plans for updated goals and discussed plans for OT moving forward. Functional reaching into cabinets from low to high, no LOB, however increased dizziness. Pt is noted to move too quickly at times. Therapist discussed importance of slowing down. Pt reports continued coordination difficulties with LUE.  Therapist discussed activities to continue at home: theraband, putty, tossing ball and performing category generation. UBE x 5 mins level 4 for conditioning.   03/23/24-discussed how pt's recent work activities went. 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  Therapist reviewed coordination activities to perfrom at home: stacking and manipulating coins, flipping and dealing cards, rotating a ball in hand, twirtling pen, min v.c to slow down. Placing grooved pegs into pegboard with LUE and removing with tweezers for increased fine motor coordination, min difficulty v.c Gripper set at level 3 sustained grip to pick up blocks, min difficulty/ v.c see pt instructions  03/16/24- UBE x 6 mins level 3 for conditioning simulated work activities; including writing pro's and con's for attending in person activities for graduation and pinning ceremony. 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 Therapist provided min v.c for organization/ planning.  03/09/24- Bell's test; 32/34 located in 4 mins 37 secs. Pt located remaining 2 with min v.c  Organizing your day problem #1 for organization and attention to detail. Pt made 1 error. Pt scheduled himself to pick up a cake right before MD appointment, however he overlooked the fact that the cake is supposed to be an ice cream cake. Pt/ therapist discussed organization strategies he is using at work.  5/25-  environmental scanning 14/14 items located in sequential order with mild dizziness tabletop scanning task 77/80 items located mild diplopia. Pt reports work activities are going well. Pt was encouraged to continue to take more frequent rest and brain breaks to avoid over fatigue. Pt cooked shrimp this weekend without issues and he cared for his 2 y.o son.  Reviewed green theraband HEP. 15 reps each, min v.c  02/19/24- Organizing your day task for attention to detail, and reading tolerance in prep for return to work . Pt has mod difficulty with task. He initally omitted 2 items and he did not account for the tempeture when buying groceries. He alos missed the detail requiring him to register for class at a specific time. Activity took pt the entire session and he reuired v.c to correct errors. Pt reported fatigue with task at end of session. Therapist discsussed implications with similar activities with return to work and discussed memory strategies and reinforced improtance of writing things down rather than trying to remember. Pt returns to work next week.  3/03/25Reading activity to read larger print items on the I pad then smaller article on the computer. Pt reports it is not blurry but he has some visual fatigue. Pt was instructed in theraband exercises: green band for shoulder abduction, then blue for remaining exercises- see pt instructions, 15 reps each, min v.c for positioning. Pt was isntructed that he can try blue band for shoulder abduction, however he should stop if he has any increased pain.      PATIENT EDUCATION: Education details:  updated HEP, exercise flow sheet, recommendation for driving eval prior to return to driving. Person educated: Patient and Spouse Education method: Explanation, demonstration, v.c , handout Education comprehension: verbalized understanding, returned demonstration,   HOME EXERCISE PROGRAM: coordination, putty   GOALS: Goals reviewed with patient?  Yes  SHORT TERM GOALS:   I with HEP  Goal status: met, 01/22/24- demonstrates understanding of putty exercises,   2.  Pt will demonstrate improved fine motor coordination as evidenced by decreasing 9 hole peg test LUE to 29 secs or less without several drops.  Goal status:met, 27.07 secs,  03/26/24- 26.63   3.  Pt will increase LUE grip strength to 52 lbs or greater for increased ease with daily  activities.  Goal status: met, 63 lbs, 03/26/24- 65 lbs  4.  Pt will perfrom tabletop scanning without diplopia or vomiting with 90% or better accuracy.  Goal status: met 03/09/24, Bell's test 94% accuracy  5.  Pt will perfrom transitional movements for ADLS/ IADLs without LOB or nausea greater than 3/10.  Goal status: met, dizziness 6/10, small headache  6. I with adapted strategies for ADLs/IADLs to improve safety and I.   Goal status: met, v.c to slow down  LONG TERM GOALS: Target date:   I with updated HEP  Goal status; met, theraband  2.  Pt will perform mod complex home management mod I  Goal status: met laundry and simple cooking mod I  3.  Pt will perform environmental scanning in a busy environment with 90% or better accuracy, without vomiting  Goal status:  met,   4.  Pt will perfrom simulated work activities mod I  Goal status: met, pt reports fatigue at times however performing mod I  5. Pt will demonstrate ability to carry an item in left and right hand simultaneously without drops.           goal status: ongoing, met in clinic, pt reports drops at home 04/27/24  6. I with updates to HEP  goal status: ongoing issued 04/27/24  7.  Pt will demonstrate improved endurance for ADLS/IADLs with pt reporting ability to stand for daily activities for 1 hour with no more than a 10 min break.       Goal status:ongoingper pt report standing for up to 45 mins at a time max. 04/27/24  8. Pt will demonstrate ability to problems solve strategies for work activities should new  challenges arise.     goal status: ongoing 04/27/24- pt is on break from school for the summer    ASSESSMENT:  CLINICAL IMPRESSION:Pt is progressing towards goals. He reports continued dizziness and nausea which is impeding activity at home. Pt demonstrates understanding of updated HEP.     PERFORMANCE DEFICITS: in functional skills including ADLs, IADLs, coordination, dexterity, ROM, strength, Fine motor control, Gross motor control, balance, endurance, decreased knowledge of precautions, decreased knowledge of use of DME, vision, UE functional use, and vestibular, cognitive skills including safety awareness, and psychosocial skills including coping strategies, environmental adaptation, habits, interpersonal interactions, and routines and behaviors.   IMPAIRMENTS: are limiting patient from ADLs, IADLs, rest and sleep, work, play, leisure, and social participation.   CO-MORBIDITIES: may have co-morbidities  that affects occupational performance. Patient will benefit from skilled OT to address above impairments and improve overall function.  MODIFICATION OR ASSISTANCE TO COMPLETE EVALUATION: Min-Moderate modification of tasks or assist with assess necessary to complete an evaluation.  OT OCCUPATIONAL PROFILE AND HISTORY: Detailed assessment: Review of records and additional review of physical, cognitive, psychosocial history related to current functional performance.  CLINICAL DECISION MAKING: LOW - limited treatment options, no task modification necessary  REHAB POTENTIAL: Good  EVALUATION COMPLEXITY: Low    PLAN:  OT FREQUENCY:5 visits  OT DURATION: 9 weeks  PLANNED INTERVENTIONS: 97168 OT Re-evaluation, 97535 self care/ADL training, 13086 therapeutic exercise, 97530 therapeutic activity, 97112 neuromuscular re-education, 97140 manual therapy, 97116 gait training, 57846 aquatic therapy, 97035 ultrasound, 97018 paraffin, 96295 moist heat, 97010 cryotherapy, 97034 contrast bath,  balance training, functional mobility training, visual/perceptual remediation/compensation, energy conservation, coping strategies training, patient/family education, and DME and/or AE instructions  RECOMMENDED OTHER SERVICES: PT  CONSULTED AND AGREED WITH PLAN OF CARE: Patient  PLAN FOR NEXT SESSION:  work towards goals.  Jamesia Linnen, OT 04/27/2024, 3:35 PM                   Marysue Fait, OT 04/27/2024, 3:35 PM

## 2024-05-05 ENCOUNTER — Ambulatory Visit

## 2024-05-05 DIAGNOSIS — R2689 Other abnormalities of gait and mobility: Secondary | ICD-10-CM

## 2024-05-05 DIAGNOSIS — R278 Other lack of coordination: Secondary | ICD-10-CM | POA: Diagnosis not present

## 2024-05-05 DIAGNOSIS — M6281 Muscle weakness (generalized): Secondary | ICD-10-CM

## 2024-05-05 NOTE — Therapy (Signed)
 OUTPATIENT PHYSICAL THERAPY NEURO TREATMENT    Patient Name: Wayne Lee MRN: [id number] DOB:Oct 22, 1976, 48 y.o., male Today's Date: 05/05/2024   PCP: No PCP REFERRING PROVIDER: Jean Michaelis, PA-C  END OF SESSION:  PT End of Session - 05/05/24 1600     Visit Number 23    Date for PT Re-Evaluation 06/20/24    Authorization Type [employer]    PT Start Time 1600    PT Stop Time 1645    PT Time Calculation (min) 45 min    Activity Tolerance Patient tolerated treatment well    Behavior During Therapy WFL for tasks assessed/performed             Past Medical History:  Diagnosis Date   Atopic dermatitis in adult    Left cataract    Obesity (BMI 30-39.9)    Stroke [facility])    Undescended left testicle    had surgical intervention   Past Surgical History:  Procedure Laterality Date   CATARACT EXTRACTION W/ INTRAOCULAR LENS IMPLANT Left 2017   CHOLECYSTECTOMY     ORCHIOPEXY  1984   SUBOCCIPITAL CRANIECTOMY CERVICAL LAMINECTOMY N/A 12/16/2023   Procedure: SUBOCCIPITAL CRANIECTOMY;  Surgeon: Elna Haggis, MD;  Location: [facility] OR;  Service: Neurosurgery;  Laterality: N/A;   Patient Active Problem List   Diagnosis Date Noted   Chronic fatigue 02/12/2024   Nausea and vomiting 01/13/2024   Central nervous system origin vertigo, unspecified laterality 01/13/2024   Impaired mobility and ADLs 01/13/2024   Bilateral headaches 12/31/2023   ICH (intracerebral hemorrhage) (HCC) 12/20/2023   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   Hypertension 12/19/2023   Brain compression (HCC) 12/19/2023   Nontraumatic acute cerebral hemorrhage (HCC) 12/16/2023   Cerebellar hemorrhage (HCC) 12/16/2023    ONSET DATE: 12/15/22  REFERRING DIAG:  Diagnosis  I61.9 (ICD-10-CM) - Nontraumatic acute cerebral hemorrhage (HCC)    THERAPY DIAG:  Other lack of coordination  Muscle weakness (generalized)  Other abnormalities of gait and mobility  Rationale for Evaluation  and Treatment: Rehabilitation  SUBJECTIVE:                                                                                                                                                                                             SUBJECTIVE STATEMENT:  I think I am okay. Been trying to do exercises at home.   Pt accompanied by: self  PERTINENT HISTORY: 1/6 stroke, ICU and IRF leaving hospital 1/21  PAIN:  Are you having pain? No 0/10   PRECAUTIONS: None  RED FLAGS: None   WEIGHT BEARING RESTRICTIONS: No  FALLS: Has patient fallen in last  6 months? No  LIVING ENVIRONMENT: Lives with: lives with their family and lives with their spouse Lives in: House/apartment Stairs: Yes: Internal: 15 steps; can reach both and External: 2 steps; none front door 4 steps rails on both sides Has following equipment at home: None  PLOF: supervision for I/ADLs  PATIENT GOALS: walking, energy, endurance  OBJECTIVE:  Note: Objective measures were completed at Evaluation unless otherwise noted.  DIAGNOSTIC FINDINGS:  Nothing new  COGNITION: Overall cognitive status: Within functional limits for tasks assessed   SENSATION: WFL  COORDINATION: Toe taps and heel-to-shin normal  EDEMA:  None  MUSCLE TONE: RLE: Within functional limits  MUSCLE LENGTH: NT  POSTURE: very wide BOS   LOWER EXTREMITY ROM:   WFL   Active  Right Eval Left Eval  Hip flexion    Hip extension    Hip abduction    Hip adduction    Hip internal rotation    Hip external rotation    Knee flexion    Knee extension    Ankle dorsiflexion    Ankle plantarflexion    Ankle inversion    Ankle eversion     (Blank rows = not tested)  LOWER EXTREMITY MMT:    MMT Right Eval Left Eval  Hip flexion 5/5 5/5  Hip extension    Hip abduction 5/5 5/5  Hip adduction 5/5 5/5  Hip internal rotation    Hip external rotation    Knee flexion 5/5 5/5  Knee extension 5/5 5/5  Ankle dorsiflexion    Ankle  plantarflexion    Ankle inversion    Ankle eversion    (Blank rows = not tested)  BED MOBILITY:  Sit to supine Complete Independence and Modified independence Supine to sit Modified independence Rolling to Right Complete Independence Rolling to Left Complete Independence  TRANSFERS: Assistive device utilized: None  Sit to stand: Modified independence Stand to sit: Complete Independence Chair to chair: Modified independence Floor: NT   STAIRS: Level of Assistance: NT Stair Negotiation Technique:  with  Number of Stairs:   Height of Stairs:   Comments: NT  GAIT: Gait pattern: wide BOS, R lateral lean using wall to balance on the way back to room Distance walked: 50 ft Assistive device utilized: None Level of assistance: Min A with HHA from wife (pt did not want to, but was leaning) Comments: could potentially use AD  FUNCTIONAL TESTS:  5 times sit to stand: 9.26 seconds Berg Balance Scale: 48/56  PATIENT SURVEYS:  NT today                                                                                                                              TREATMENT DATE: 05/05/24 Elliptical L4 x31mins  Step up to single limb stance x10 Jumping jacks 20s x2  SL RDL 5# weight x10 3 way cone taps on airex  Squat to OHP throw with yellow ball 2x10   04/27/24 Elliptical LEvel  5 x 3 minutes Gait outside with 4# biceps, OHP and punches On bosu 2 ways ball toss Resisted gait all directions 5# SLS DL on and off airex with CGA and hand on wall. 10# holding overhead in single arm STS SLS ball toss Small jumps, some questions when HR is elevated like math and state capitals etc.. HR up to 160 bpm  04/20/24 Nustep level 5 x 5 minutes Bike Level 5 x 5 minutes power burst Tmill push 20 seconds x 3  Skipping Side to side jumping  Front to back jumping Bosu step ups Mat step ups [employer] Side shuffles On bosu reaching, ball toss  04/06/24 Walk outdoors 2 laps Elliptical L3  x47mins  Squat and row 20# 2x10  Shuffling  Side step into SL on BOSU x10 each side- needs bars to hold on  Plank 20s x2, plank and reach (very hard, unable to keep trunk from rotating, 4 taps at best before rolling over)   03/31/24 Elliptical L3x40mins  NuStep L5x70mins  Resisted gait with step up forward and side ways 50# x5  Treadmill pushes forward and backwards 30s x2  [employer] with blue ball 2x10  STS with 8# each arm OHP 2x10  Side steps on beam with catch  03/23/24  Nustep L6x8 minutes SPM >90 BLEs only  FGA 28/30, SLS and tandem on foam testing    Tandem stance on blue foam pad + head turns x6 rounds Tandem walks blue foam pad x4 rounds Forward lunges onto BOSU x10 B Lateral lunges onto BOSU x10 B Runners step up onto BOSU x10 B  Education on results of testing today and plan to hold PT for now, DC if we do not hear back within 30 days     03/18/24 Bike with 2 power bursts L4 x75mins  Resisted hold 50# doing cone taps, then on airex-- some LOB On BOSU catch, then multi-tasking counting backwards from 50 by 2, then counting forward by 3s  Trampoline jumps- single leg, double leg, in and out, scissoring 2x10 HS curls 45# 2x10 Leg ext 25# 2x10 Plyo pushes on leg press 20# 20s x2    03/09/24 Elliptical L5 x66mins  Resisted gait on to airex 40# x5 leading each side  On airex straight arm pulls 2x10 Quick taps 4" step  Ladder drills -quick steps in and out laterally, then 1 foot -two feet in and out of each box  -big steps skipping over squares -jumping two feet in box and out Walk outdoors 2 laps    03/04/24  Elliptical L6 x8 minutes for w/u and neural priming  Walking outside down/up hills and curbs with ball toss to self min guard Grape vines down and up hill Walking down hill forward/up backwards with ball toss to self Walking down backwards/up forwards with ball toss to self Sideways high knees + lateral step overs + BOSU kicks + backwards figure 8  walks (3 rounds) Standing on soft surface of BOSU 30 seconds + dribbling yellow weighted ball between cones + lateral alternating cone taps + step over BOSU 1 round     02/25/24 Nustep level 5 x 5 minutes keeping above 70 SPM Walking outside around building good pace, no rest, some ball [employer] kicking On rocker board 2 ways On bosu reaching for numbers On bosu ball toss On airex 15# extension On airex 10# chest press Side step on and off airex and then with ball toss Skipping Side shuffling  02/24/24:  Gait  outdoors on asphalt and sidewalk, curb, for 8 min, occasional weaving, self corrects Bosu on smooth side , throwing and catching a ball Bosu on smooth side in ll bars, reaching forward to targets, then facing side ways and reaching with same hand and across body to opposite side Resisted gait 5 x each direction 50#, lost balance more with walking backwards then change to controlling forward pace RDL's single leg, to elevated hi/low mat, 5 x each leg In ll bars for forward toe taps to cone, from 8" step In ll bars for vertical jumps x 30 sec Side weaving, over/ under, x 20' x 2 each direction, walking slowly PATIENT EDUCATION: Education details: Diagnosis, Prognosis, POC, holding off on HEP Person educated: Patient and Spouse Education method: Explanation, Demonstration, Tactile cues, and Verbal cues Education comprehension: verbalized understanding, returned demonstration, verbal cues required, tactile cues required, and needs further education  HOME EXERCISE PROGRAM:  Access Code: [email or web address] URL: [email or web address] Date: 03/23/2024 Prepared by: Terrel Ferries  Exercises - Tandem Stance in Corner  - 1-2 x daily - 7 x weekly - 1 sets - 3 reps - 30 seconds  hold - Seated Gaze Stabilization with Head Rotation  - 1-2 x daily - 7 x weekly - 1 sets - 2 reps - 60 seconds  hold - Tandem Stance with Head Rotation  - 1 x daily - 7 x weekly - 1 sets - 4 reps -  Backward Tandem Walking  - 1 x daily - 7 x weekly - 1 sets - 5 reps - Walking Tandem Stance  - 1 x daily - 7 x weekly - 1 sets - 5 reps - Lunge Onto BOSU Ball  - 1 x daily - 7 x weekly - 1-2 sets - 10 reps - Runner's Step Up on BOSU Ball  - 1 x daily - 7 x weekly - 1-2 sets - 10 reps   GOALS: Goals reviewed with patient? No  SHORT TERM GOALS: Target date: 01/25/24  Initial HEP will be prescribed by 3rd visit Baseline: not provided  Goal status: MET 01/20/24  2.  Headache pain will not increase above 7/10 during session.  Baseline: started at 7/10, pt verbalized it was worse at end of eval  Goal status: ongoing 01/21/24, MET 01/28/24  3.  Activity increase will not elicit vomiting. Baseline: vomited at end of eval after BERG testing. Goal status: ongoing 01/21/24, doing harder tasks in sessions no vomiting in 3 sessions 01/28/24, progressing 02/10/24, MET 03/09/24   LONG TERM GOALS: Target date: 06/20/24  Pt will be independent with advanced HEP, as prescribed throughout POC. Baseline: not prescribed yet Goal status: ongoing 02/20/24, ongoing 05/05/24  2.  Pt will decrease HA pain to </= 4/10 with activity.  Baseline: > 7/10 Goal status: progressing 02/25/24; 03/23/24- good days and bad days, ongoing and still wearing patches for nausea 04/06/24  3.  Pt will increase BERG balance test score to >/= 52/56. Baseline: 48/56 Goal status: MET 54/56 02/04/24  4.  Pt will complete full session without vomiting or any other adverse reactions.  Baseline: vomited after eval Goal status: progressing 2 sessions back to back w/vomit, 2 sessions without, MET 02/10/24  5. Pt will complete SLS and tandem on firm and foam without LOB >10s   Baseline: 5s  Goal status: progressing 02/20/24 MET 03/23/24  6. Pt will be able to tolerate moderate to vigorous 45 mins PT session with minimal fatigue   Baseline: mild unsteadiness still present esp with fatigue  Goals status: ongoing  04/27/24    ASSESSMENT:  CLINICAL IMPRESSION: We continued to push patient with endurance and coordination tasks, adding in some multi step movements. We worked on some single leg stability with step ups and RDLs. He has the most difficulty with SL RDLs and reports some dizziness with it. SL cone taps on airex were also hard, frequent LOB.   OBJECTIVE IMPAIRMENTS: decreased activity tolerance, decreased balance, difficulty walking, decreased safety awareness, impaired perceived functional ability, increased muscle spasms, and pain.   ACTIVITY LIMITATIONS: standing, stairs, and locomotion level  PARTICIPATION LIMITATIONS: cleaning, laundry, driving, shopping, community activity, and occupation  PERSONAL FACTORS: 1 comorbidity: history of stroke are also affecting patient's functional outcome.   REHAB POTENTIAL: Good  CLINICAL DECISION MAKING: Evolving/moderate complexity  EVALUATION COMPLEXITY: Moderate  PLAN:  PT FREQUENCY: 1-2x/week  PT DURATION: 8 weeks  PLANNED INTERVENTIONS: 97164- PT Re-evaluation, 97110-Therapeutic exercises, 97530- Therapeutic activity, 97112- Neuromuscular re-education, 97535- Self Care, 14782- Manual therapy, and 97116- Gait training  PLAN FOR NEXT SESSION: renewal done, will continue 1x/week or every other week to maximize balance and function, work with elevated HR for higher level balance and cognition  Cherylene Corrente, PT 05/05/24 4:46 PM

## 2024-05-07 ENCOUNTER — Encounter: Payer: Self-pay | Admitting: Neurology

## 2024-05-07 ENCOUNTER — Ambulatory Visit (INDEPENDENT_AMBULATORY_CARE_PROVIDER_SITE_OTHER): Payer: Self-pay | Admitting: Neurology

## 2024-05-07 VITALS — BP 126/84 | HR 70 | Ht 68.0 in | Wt 248.0 lb

## 2024-05-07 DIAGNOSIS — Z9889 Other specified postprocedural states: Secondary | ICD-10-CM | POA: Diagnosis not present

## 2024-05-07 DIAGNOSIS — R42 Dizziness and giddiness: Secondary | ICD-10-CM

## 2024-05-07 DIAGNOSIS — I614 Nontraumatic intracerebral hemorrhage in cerebellum: Secondary | ICD-10-CM

## 2024-05-07 NOTE — Progress Notes (Signed)
 [facility] [address] [place]. [place] [address] [phone]       OFFICE CONSULT NOTE  [name] Date of Birth:  07/30/1976 Medical Record Number:  [id number]   Referring MD: Dina Francisco love, PA-C  Reason for Referral: Cerebellar hemorrhage  HPI: [name] is a 48 year old African-American male seen today for initial office consultation visit for brain hemorrhage.  He is accompanied by his wife.  History is obtained from them and review of electronic medical records.  I personally reviewed pertinent available imaging films in PACS.  He has past medical history of obesity and, hyperlipidemia and at risk for sleep apnea.  He presented on 12/16/2023 with sudden onset of headache and feeling dizzy while climbing steps.  He vomited and told his wife that he may be having a stroke.  They called 911 and EMS team activated a code stroke en route.  On arrival he had mild left-sided weakness and dysarthria.  NIH score was 9 ICH score was 1.  He had no history of hypertension but blood pressure on arrival was 204/106.  CT head showed 5 send 3 m left cerebellar parenchymal hemorrhage with mass effect and narrowing of the fourth ventricle no hydrocephalus.  The basal cisterns are effaced.  There is early ascending transtentorial herniation.  CT angiogram was emergently deferred to stabilize the patient because of need for intubation and blood pressure control and craniotomy.  Patient was taken for emergent posterior fossa decompression by Dr. Ellery Guthrie.  He was monitored closely in the ICU and started on hypertonic saline.  He did well and was extubated the next next day.  His neurological exam improved and he had no focal deficits except ataxia and dizziness.  He was seen by therapy and transferred to inpatient rehab where he stayed for a few weeks and subsequently was discharged home on 125.  He is currently getting outpatient physical occupational therapy.  He has noticed improvement  in his balance and coordination.  He still has mild coordination difficulties on the left and imbalance particularly when he walks quickly he may sometimes stumble and to hold onto walls.  He is able to ambulate without a cane or walker most of the time.  He did go to the ER on 01/09/2024 for creasing nausea and vertigo and dizziness.  Repeat CT head showed stable postoperative changes of posterior fossa decompression without any acute or new hemorrhage.  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  CT angiogram showed no large vessel stenosis or occlusion or AVM.  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  Wanting to drive.  He does still get dizzy particularly when he moves quickly or suddenly at times his coordination is off on the left.  Does take Antivert  as needed.  ROS:   14 system review of systems is positive for dizziness, vertigo, imbalance, nausea all other systems negative  PMH:  Past Medical History:  Diagnosis Date   Atopic dermatitis in adult    Left cataract    Obesity (BMI 30-39.9)    Stroke [facility])    Undescended left testicle    had surgical intervention    Social History:  Social History   Socioeconomic History   Marital status: Married    Spouse name: Not on file   Number of children: Not on file   Years of education: Not on  file   Highest education level: Not on file  Occupational History   Not on file  Tobacco Use   Smoking status: Former    Types: Cigarettes    Start date: 2005    Quit date: 1996    Years since quitting: 29.4   Smokeless tobacco: Never  Vaping Use   Vaping status: Never Used  Substance and Sexual Activity   Alcohol  use: Never   Drug use: Never   Sexual activity: Not on file  Other Topics Concern   Not on file  Social History Narrative   Not on file   Social Drivers of Health    Financial Resource Strain: Low Risk  (01/01/2024)   Received from [facility]   Overall Financial Resource Strain (CARDIA)    Difficulty of Paying Living Expenses: Not hard at all  Food Insecurity: No Food Insecurity (01/01/2024)   Received from [facility]   Hunger Vital Sign    Worried About Running Out of Food in the Last Year: Never true    Ran Out of Food in the Last Year: Never true  Transportation Needs: No Transportation Needs (01/01/2024)   Received from [facility] - Transportation    Lack of Transportation (Medical): No    Lack of Transportation (Non-Medical): No  Physical Activity: Unknown (09/02/2023)   Received from [facility]   Exercise Vital Sign    Days of Exercise per Week: 0 days    Minutes of Exercise per Session: Not on file  Stress: No Stress Concern Present (09/02/2023)   Received from [facility] - Occupational Stress Questionnaire    Feeling of Stress : Not at all  Social Connections: Socially Integrated (09/02/2023)   Received from [facility]   Social Network    How would you rate your social network (family, work, friends)?: Good participation with social networks  Intimate Partner Violence: Not At Risk (12/17/2023)   Humiliation, Afraid, Rape, and Kick questionnaire    Fear of Current or Ex-Partner: No    Emotionally Abused: No    Physically Abused: No    Sexually Abused: No    Medications:   Current Outpatient Medications on File Prior to Visit  Medication Sig Dispense Refill   acetaminophen  (TYLENOL ) 325 MG tablet Take 2 tablets (650 mg total) by mouth 4 (four) times daily.     atorvastatin  (LIPITOR) 20 MG tablet Take 1 tablet (20 mg total) by mouth daily. 90 tablet 0   scopolamine  (TRANSDERM SCOP , 1.5 MG,) 1 MG/3DAYS Place 1 patch (1.5 mg total) onto the skin every 3 (three) days. 10 patch 5   desonide (DESOWEN) 0.05 % ointment Apply 1 Application topically 2 (two) times daily.      No current facility-administered medications on file prior to visit.    Allergies:   Allergies  Allergen Reactions   Penicillins Hives and Swelling    Physical Exam General: Mildly obese middle-aged African-American male seated, in no evident distress Head: head normocephalic and atraumatic.   Neck: supple with no carotid or supraclavicular bruits Cardiovascular: regular rate and rhythm, no murmurs Musculoskeletal: no deformity Skin:  no rash/petichiae Vascular:  Normal pulses all extremities  Neurologic Exam Mental Status: Awake and fully alert. Oriented to place and time. Recent and remote memory intact. Attention span, concentration and fund of knowledge appropriate. Mood and affect appropriate.  Cranial Nerves: Fundoscopic exam reveals sharp disc margins. Pupils equal, briskly reactive to light. Extraocular movements  full without nystagmus. Visual fields full to confrontation. Hearing intact. Facial sensation intact. Face, tongue, palate moves normally and symmetrically.  Motor: Normal bulk and tone. Normal strength in all tested extremity muscles. Sensory.: intact to touch , pinprick , position and vibratory sensation.  Coordination: Rapid alternating movements normal in all extremities. Finger-to-nose and heel-to-shin performed accurately bilaterally. Gait and Station: Arises from chair without difficulty. Stance is normal. Gait demonstrates normal stride length and balance . Able to heel, toe and tandem walk with moderate difficulty.  Reflexes: 1+ and symmetric. Toes downgoing.   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     PLAN:I had a long discussion with the patient and his wife regarding his cerebellar hemorrhage and craniotomy and answered  questions.  He is doing remarkably well with only mild residual dizziness and ataxia.  I recommend checking follow-up MRI scan of the brain with and without contrast to rule out any underlying small vascular lesion not seen on initial scans.  Continue ongoing physical occupational therapy.  Keep strict control of hypertension with blood pressure goal below 140/90.  I advised him to get up slowly and avoid sudden movements.  He will return for follow-up in the future in 6 months or call earlier if necessary. Greater than 50% time during this 45-minute consultation visit was spent in counseling and coordination of care about his cerebellar hemorrhage and discussion about evaluation and treatment and answering questions. Ardella Beaver, MD Note: This document was prepared with digital dictation and possible smart phrase technology. Any transcriptional errors that result from this process are unintentional.

## 2024-05-07 NOTE — Patient Instructions (Signed)
 I had a long discussion with the patient and his wife regarding his cerebellar hemorrhage and craniotomy and answered questions.  He is doing remarkably well with only mild residual dizziness and ataxia.  I recommend checking follow-up MRI scan of the brain with and without contrast to rule out any underlying small vascular lesion not seen on initial scans.  Continue ongoing physical occupational therapy.  Keep strict control of hypertension with blood pressure goal below 140/90.  I advised him to get up slowly and avoid sudden movements.  He will return for follow-up in the future in 6 months or call earlier if necessary.

## 2024-05-12 NOTE — Progress Notes (Signed)
 Subjective:    Patient ID: Wayne Lee, male    DOB: 03-16-1976, 48 y.o.   MRN: [id number]  HPI  Wayne Lee is a 48 y.o. year old male  who  has a past medical history of Atopic dermatitis in adult, Left cataract, Obesity (BMI 30-39.9), Stroke (HCC), and Undescended left testicle.   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   Plan from last visit:  Nontraumatic acute cerebral hemorrhage (HCC)   I will fill out return to work forms and fax them ASAP - indicating 8 hours/day with 30 minute rest break every 2 hours, and 2 hours/ 2x per week for therapies, and possible 2 hours off 2x weekly for exacerbations up to 05/13/24; cannot lift 25 lbs overhead due to balance issues but should not interfere with [facility]. Did return foster care forms today   Follow up in 3 months   Central nervous system origin vertigo, unspecified laterality Nausea and vomiting, unspecified vomiting type Continue your scopalamine patch   Continue as needed meclizine ; you can take up to 50 mg in a single dose, but do not exceed 100 mg daily     Wait a month and, if you are feeling good on car trips without nausea or motion sickness, look into OT driving evaluations before returning to driving; form provided today   Bilateral headaches   Minimal; well controlled with Tylenol  now   Impaired mobility and ADLs Chronic fatigue   Was somewhat pre-existing IPH; has undergone workup with PCP   Pick the same time to lay down every night, ideally between 8 and 10 PM.     Starting 1 hour before you want to go to sleep, turn off all television screens, phone screens, tablets, and computers.     Keep the lights low and perform only low stimulation activities, such as reading.     Only use your bedroom for  sleep and sex. Avoid daytime naps and limit any time spent in your bed that is not dedicated to sleep. --patient currently taking 2 hour naps daily   You may also take 3 to 5 mg of over-the-counter melatonin approximately 1 hour before bedtime, or if you are prescribed a medication for sleep take it at this time.   Avoid alcohol , decongestants/pseudoephedrine, antihistamines, caffeine , and nicotine a few hours before bedtime, as these can reduce your quality of sleep.    Interval Hx:  - Therapies: He can sit at the computer for about an hour and focus without issues. Recently he tried being active over a long weekend and "that did not fly", he was very social and then tried to do that the next day as well; Sunday had to cancel going to the circus. They went to brunch that day, he stayed home with his son. He is walking a little under a mile at PT; has not been doing that at home. Does L hand HEP. He takes the dogs out and he throws a ball with his daughter a few times a day. He was asking about weight lifting restrictions.    - Follow ups:  Dr Janett Medin 05/07/24: I recommend checking follow-up MRI scan of the brain with and without contrast to rule out any underlying small vascular lesion not seen on initial scans. Continue ongoing physical occupational therapy. Keep strict control  of hypertension with blood pressure goal below 140/90. I advised him to get up slowly and avoid sudden movements. He will return for follow-up in the future in 6 months or call earlier if necessary.    - Falls: a few near-misses, no actual falls. Has issues going around corners and squatting (his first time). He gets dizzy picking his son up and gets a headache - son weighs about 30 lbs.    - DME: none   - Medications: Has been just using Scopalamine patch, hasn't needed the meclizine  recently but only had one episode of vomitting in the last month.    - Other concerns: He has been driving as of last week - drove to his  daughter's school last week and this week. He has been practicing in a parking lot and done OK.   [place] of [place] of [place] and [place] of [place]. They are dividing it out with off days in between - PT has cleared him for this.    Pain Inventory Average Pain 2 Pain Right Now 0 My pain is intermittent and aching  In the last 24 hours, has pain interfered with the following? General activity 0 Relation with others 0 Enjoyment of life 0 What TIME of day is your pain at its worst? daytime, evening, and varies Sleep (in general) Good  Pain is worse with: inactivity Pain improves with: n/a Relief from Meds: N/a   Family History  Problem Relation Age of Onset   Heart Problems Mother    Cancer Father    Cancer Maternal Grandfather    Social History   Socioeconomic History   Marital status: Married    Spouse name: Not on file   Number of children: Not on file   Years of education: Not on file   Highest education level: Not on file  Occupational History   Not on file  Tobacco Use   Smoking status: Former    Types: Cigarettes    Start date: 2005    Quit date: 1996    Years since quitting: 29.4   Smokeless tobacco: Never  Vaping Use   Vaping status: Never Used  Substance and Sexual Activity   Alcohol  use: Never   Drug use: Never   Sexual activity: Not on file  Other Topics Concern   Not on file  Social History Narrative   Not on file   Social Drivers of Health   Financial Resource Strain: Low Risk  (01/01/2024)   Received from [facility]   Overall Financial Resource Strain (CARDIA)    Difficulty of Paying Living Expenses: Not hard at all  Food Insecurity: No Food Insecurity (01/01/2024)   Received from [facility]   Hunger Vital Sign    Worried About Running Out of Food in the Last Year: Never true    Ran Out of Food in the Last Year: Never true  Transportation Needs: No Transportation Needs (01/01/2024)   Received from [facility] - Transportation    Lack of Transportation (Medical): No    Lack of Transportation (Non-Medical): No  Physical Activity: Unknown (09/02/2023)   Received from [facility]   Exercise Vital Sign    Days of Exercise per Week: 0 days    Minutes of Exercise per Session: Not on file  Stress: No Stress Concern Present (09/02/2023)   Received from [facility] - Occupational Stress Questionnaire  Feeling of Stress : Not at all  Social Connections: Socially Integrated (09/02/2023)   Received from [facility]   Social Network    How would you rate your social network (family, work, friends)?: Good participation with social networks   Past Surgical History:  Procedure Laterality Date   CATARACT EXTRACTION W/ INTRAOCULAR LENS IMPLANT Left 2017   CHOLECYSTECTOMY     ORCHIOPEXY  1984   SUBOCCIPITAL CRANIECTOMY CERVICAL LAMINECTOMY N/A 12/16/2023   Procedure: SUBOCCIPITAL CRANIECTOMY;  Surgeon: Elna Haggis, MD;  Location: [facility] OR;  Service: Neurosurgery;  Laterality: N/A;   Past Surgical History:  Procedure Laterality Date   CATARACT EXTRACTION W/ INTRAOCULAR LENS IMPLANT Left 2017   CHOLECYSTECTOMY     ORCHIOPEXY  1984   SUBOCCIPITAL CRANIECTOMY CERVICAL LAMINECTOMY N/A 12/16/2023   Procedure: SUBOCCIPITAL CRANIECTOMY;  Surgeon: Elna Haggis, MD;  Location: [facility] OR;  Service: Neurosurgery;  Laterality: N/A;   Past Medical History:  Diagnosis Date   Atopic dermatitis in adult    Left cataract    Obesity (BMI 30-39.9)    Stroke [facility])    Undescended left testicle    had surgical intervention   BP 121/81 (BP Location: Left Arm, Patient Position: Sitting)   Pulse 75   Ht 5\' 8"  (1.727 m)   Wt 248 lb (112.5 kg)   SpO2 93%   BMI 37.71 kg/m   Opioid Risk Score:   Fall Risk Score:  `1  Depression screen [facility] 2/9     05/13/2024    3:32 PM 02/12/2024    3:18 PM 01/13/2024    8:42 AM  Depression screen PHQ 2/9  Decreased Interest 0 0  0  Down, Depressed, Hopeless 0 0 0  PHQ - 2 Score 0 0 0  Altered sleeping   2  Tired, decreased energy   2  Change in appetite   2  Feeling bad or failure about yourself    0  Trouble concentrating   1  Moving slowly or fidgety/restless   0  Suicidal thoughts   0  PHQ-9 Score   7  Difficult doing work/chores   Very difficult     Review of Systems  All other systems reviewed and are negative.      Objective:   Physical Exam   Constitution: Appropriate appearance for age. No apparent distress. Resp: No respiratory distress. No accessory muscle usage. on RA and CTAB Cardio: Well perfused appearance No peripheral edema. Abdomen: Nondistended. Nontender.   Psych: improving affect. Still a little lethargic/flat.  Neuro: AAOx4. No apparent cognitive deficits.    Neurologic Exam:   Eyes:  NO nystagmus but vertiginous symptoms with superior gaze only--ongoing, less severe than prior.  PERRLA. EOMI.  DTRs: Reflexes were 2+  Babinsky: flexor responses b/l.   Hoffmans: negative b/l Sensory exam: revealed normal sensation in all dermatomal regions in bilateral upper extremities and bilateral lower extremities Motor exam: strength 5/5 throughout bilateral upper extremities and bilateral lower extremities Coordination: Fine motor coordination was normal.  No ataxia.  Gait: normal; single leg stands b/l <5 seconds  VOR: negative testing and suppression.        Assessment & Plan:   Wayne Lee is a 48 y.o. year old male  who  has a past medical history of Atopic dermatitis in adult, Left cataract, Obesity (BMI 30-39.9), Stroke (HCC), and Undescended left testicle.   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   Nontraumatic acute cerebral hemorrhage (HCC) Central nervous  system origin vertigo, unspecified laterality Doing much better now, ongoing nausea and fatigue remain challenging.  Continue gradual increase in recreational activities, family events.  Make sure you remain well-hydrated and give yourself lots of opportunities for cognitive and physical rest breaks during your trip to [place].  Follow-up in approximately 6 months, or sooner if needed  Nausea and vomiting, unspecified vomiting type Continue scopolamine  patch; will order 20 patches per refill due to tendency for patches to fall off.  Did discuss switching over to meclizine , however as needed meclizine  made patient more fatigued.  Has not required Valium , will not refill.  Chronic fatigue continues to be a challenge, but is improving.  Increase activity tolerance gradually.  Impaired mobility and ADLs Continue PT, no new DME requirements at this time

## 2024-05-13 ENCOUNTER — Encounter: Attending: Physical Medicine and Rehabilitation | Admitting: Physical Medicine and Rehabilitation

## 2024-05-13 ENCOUNTER — Encounter: Payer: Self-pay | Admitting: Physical Medicine and Rehabilitation

## 2024-05-13 VITALS — BP 121/81 | HR 75 | Ht 68.0 in | Wt 248.0 lb

## 2024-05-13 DIAGNOSIS — R112 Nausea with vomiting, unspecified: Secondary | ICD-10-CM | POA: Diagnosis not present

## 2024-05-13 DIAGNOSIS — Z789 Other specified health status: Secondary | ICD-10-CM | POA: Diagnosis present

## 2024-05-13 DIAGNOSIS — Z7409 Other reduced mobility: Secondary | ICD-10-CM | POA: Insufficient documentation

## 2024-05-13 DIAGNOSIS — R5382 Chronic fatigue, unspecified: Secondary | ICD-10-CM | POA: Diagnosis not present

## 2024-05-13 DIAGNOSIS — I619 Nontraumatic intracerebral hemorrhage, unspecified: Secondary | ICD-10-CM

## 2024-05-13 DIAGNOSIS — H814 Vertigo of central origin: Secondary | ICD-10-CM

## 2024-05-15 NOTE — Therapy (Signed)
 OUTPATIENT PHYSICAL THERAPY NEURO TREATMENT    Patient Name: Wayne Lee MRN: [id number] DOB:June 23, 1976, 48 y.o., male Today's Date: 05/18/2024   PCP: No PCP REFERRING PROVIDER: Jean Michaelis, PA-C  END OF SESSION:  PT End of Session - 05/18/24 1613     Visit Number 24    Date for PT Re-Evaluation 06/20/24    Authorization Type [employer]    PT Start Time 1615    PT Stop Time 1700    PT Time Calculation (min) 45 min    Activity Tolerance Patient tolerated treatment well    Behavior During Therapy WFL for tasks assessed/performed              Past Medical History:  Diagnosis Date   Atopic dermatitis in adult    Left cataract    Obesity (BMI 30-39.9)    Stroke [facility])    Undescended left testicle    had surgical intervention   Past Surgical History:  Procedure Laterality Date   CATARACT EXTRACTION W/ INTRAOCULAR LENS IMPLANT Left 2017   CHOLECYSTECTOMY     ORCHIOPEXY  1984   SUBOCCIPITAL CRANIECTOMY CERVICAL LAMINECTOMY N/A 12/16/2023   Procedure: SUBOCCIPITAL CRANIECTOMY;  Surgeon: Elna Haggis, MD;  Location: [facility] OR;  Service: Neurosurgery;  Laterality: N/A;   Patient Active Problem List   Diagnosis Date Noted   Chronic fatigue 02/12/2024   Nausea and vomiting 01/13/2024   Central nervous system origin vertigo, unspecified laterality 01/13/2024   Impaired mobility and ADLs 01/13/2024   Bilateral headaches 12/31/2023   ICH (intracerebral hemorrhage) (HCC) 12/20/2023   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   Hypertension 12/19/2023   Brain compression (HCC) 12/19/2023   Nontraumatic acute cerebral hemorrhage (HCC) 12/16/2023   Cerebellar hemorrhage (HCC) 12/16/2023   Chronic renal insufficiency, stage 3 (moderate) (HCC) 04/23/2022   Atopic dermatitis 12/20/2015   Chronic eczema 04/23/2011    ONSET DATE: 12/15/22  REFERRING DIAG:  Diagnosis  I61.9 (ICD-10-CM) - Nontraumatic acute cerebral hemorrhage (HCC)    THERAPY DIAG:   Other lack of coordination  Muscle weakness (generalized)  Other abnormalities of gait and mobility  Rationale for Evaluation and Treatment: Rehabilitation  SUBJECTIVE:                                                                                                                                                                                             SUBJECTIVE STATEMENT:  I played outside with my kids. I was able to be out all day. Played catch and was able to get down on the mat with them. I did feel it and still paying for it.  Pt accompanied by: self  PERTINENT HISTORY: 1/6 stroke, ICU and IRF leaving hospital 1/21  PAIN:  Are you having pain? No 0/10   PRECAUTIONS: None  RED FLAGS: None   WEIGHT BEARING RESTRICTIONS: No  FALLS: Has patient fallen in last 6 months? No  LIVING ENVIRONMENT: Lives with: lives with their family and lives with their spouse Lives in: House/apartment Stairs: Yes: Internal: 15 steps; can reach both and External: 2 steps; none front door 4 steps rails on both sides Has following equipment at home: None  PLOF: supervision for I/ADLs  PATIENT GOALS: walking, energy, endurance  OBJECTIVE:  Note: Objective measures were completed at Evaluation unless otherwise noted.  DIAGNOSTIC FINDINGS:  Nothing new  COGNITION: Overall cognitive status: Within functional limits for tasks assessed   SENSATION: WFL  COORDINATION: Toe taps and heel-to-shin normal  EDEMA:  None  MUSCLE TONE: RLE: Within functional limits  MUSCLE LENGTH: NT  POSTURE: very wide BOS   LOWER EXTREMITY ROM:   WFL   Active  Right Eval Left Eval  Hip flexion    Hip extension    Hip abduction    Hip adduction    Hip internal rotation    Hip external rotation    Knee flexion    Knee extension    Ankle dorsiflexion    Ankle plantarflexion    Ankle inversion    Ankle eversion     (Blank rows = not tested)  LOWER EXTREMITY MMT:    MMT Right Eval  Left Eval  Hip flexion 5/5 5/5  Hip extension    Hip abduction 5/5 5/5  Hip adduction 5/5 5/5  Hip internal rotation    Hip external rotation    Knee flexion 5/5 5/5  Knee extension 5/5 5/5  Ankle dorsiflexion    Ankle plantarflexion    Ankle inversion    Ankle eversion    (Blank rows = not tested)  BED MOBILITY:  Sit to supine Complete Independence and Modified independence Supine to sit Modified independence Rolling to Right Complete Independence Rolling to Left Complete Independence  TRANSFERS: Assistive device utilized: None  Sit to stand: Modified independence Stand to sit: Complete Independence Chair to chair: Modified independence Floor: NT   STAIRS: Level of Assistance: NT Stair Negotiation Technique:  with  Number of Stairs:   Height of Stairs:   Comments: NT  GAIT: Gait pattern: wide BOS, R lateral lean using wall to balance on the way back to room Distance walked: 50 ft Assistive device utilized: None Level of assistance: Min A with HHA from wife (pt did not want to, but was leaning) Comments: could potentially use AD  FUNCTIONAL TESTS:  5 times sit to stand: 9.26 seconds Berg Balance Scale: 48/56  PATIENT SURVEYS:  NT today  TREATMENT DATE: 05/18/24 Walk outdoors 2 laps  Quick taps 4" 30s  On BOSU squat and throw yellow ball 2x10 SLS on BOSU Knee to elbow standing crunch with 4# 20 reps alt  Ployometrics on leg press 40# 20s x3  05/05/24 Elliptical L4 x64mins  Step up to single limb stance x10 Jumping jacks 20s x2  SL RDL 5# weight x10 3 way cone taps on airex  Squat to OHP throw with yellow ball 2x10   04/27/24 Elliptical LEvel 5 x 3 minutes Gait outside with 4# biceps, OHP and punches On bosu 2 ways ball toss Resisted gait all directions 5# SLS DL on and off airex with CGA and hand on wall. 10# holding overhead  in single arm STS SLS ball toss Small jumps, some questions when HR is elevated like math and state capitals etc.. HR up to 160 bpm  04/20/24 Nustep level 5 x 5 minutes Bike Level 5 x 5 minutes power burst Tmill push 20 seconds x 3  Skipping Side to side jumping  Front to back jumping Bosu step ups Mat step ups [employer] Side shuffles On bosu reaching, ball toss  04/06/24 Walk outdoors 2 laps Elliptical L3 x20mins  Squat and row 20# 2x10  Shuffling  Side step into SL on BOSU x10 each side- needs bars to hold on  Plank 20s x2, plank and reach (very hard, unable to keep trunk from rotating, 4 taps at best before rolling over)   03/31/24 Elliptical L3x55mins  NuStep L5x23mins  Resisted gait with step up forward and side ways 50# x5  Treadmill pushes forward and backwards 30s x2  [employer] with blue ball 2x10  STS with 8# each arm OHP 2x10  Side steps on beam with catch  03/23/24  Nustep L6x8 minutes SPM >90 BLEs only  FGA 28/30, SLS and tandem on foam testing    Tandem stance on blue foam pad + head turns x6 rounds Tandem walks blue foam pad x4 rounds Forward lunges onto BOSU x10 B Lateral lunges onto BOSU x10 B Runners step up onto BOSU x10 B  Education on results of testing today and plan to hold PT for now, DC if we do not hear back within 30 days     03/18/24 Bike with 2 power bursts L4 x38mins  Resisted hold 50# doing cone taps, then on airex-- some LOB On BOSU catch, then multi-tasking counting backwards from 50 by 2, then counting forward by 3s  Trampoline jumps- single leg, double leg, in and out, scissoring 2x10 HS curls 45# 2x10 Leg ext 25# 2x10 Plyo pushes on leg press 20# 20s x2    03/09/24 Elliptical L5 x86mins  Resisted gait on to airex 40# x5 leading each side  On airex straight arm pulls 2x10 Quick taps 4" step  Ladder drills -quick steps in and out laterally, then 1 foot -two feet in and out of each box  -big steps skipping over  squares -jumping two feet in box and out Walk outdoors 2 laps    03/04/24  Elliptical L6 x8 minutes for w/u and neural priming  Walking outside down/up hills and curbs with ball toss to self min guard Grape vines down and up hill Walking down hill forward/up backwards with ball toss to self Walking down backwards/up forwards with ball toss to self Sideways high knees + lateral step overs + BOSU kicks + backwards figure 8 walks (3 rounds) Standing on soft surface of BOSU 30 seconds + dribbling  yellow weighted ball between cones + lateral alternating cone taps + step over BOSU 1 round     02/25/24 Nustep level 5 x 5 minutes keeping above 70 SPM Walking outside around building good pace, no rest, some ball [employer] kicking On rocker board 2 ways On bosu reaching for numbers On bosu ball toss On airex 15# extension On airex 10# chest press Side step on and off airex and then with ball toss Skipping Side shuffling  02/24/24:  Gait outdoors on asphalt and sidewalk, curb, for 8 min, occasional weaving, self corrects Bosu on smooth side , throwing and catching a ball Bosu on smooth side in ll bars, reaching forward to targets, then facing side ways and reaching with same hand and across body to opposite side Resisted gait 5 x each direction 50#, lost balance more with walking backwards then change to controlling forward pace RDL's single leg, to elevated hi/low mat, 5 x each leg In ll bars for forward toe taps to cone, from 8" step In ll bars for vertical jumps x 30 sec Side weaving, over/ under, x 20' x 2 each direction, walking slowly PATIENT EDUCATION: Education details: Diagnosis, Prognosis, POC, holding off on HEP Person educated: Patient and Spouse Education method: Explanation, Demonstration, Tactile cues, and Verbal cues Education comprehension: verbalized understanding, returned demonstration, verbal cues required, tactile cues required, and needs further  education  HOME EXERCISE PROGRAM:  Access Code: [email or web address] URL: [email or web address] Date: 03/23/2024 Prepared by: Terrel Ferries  Exercises - Tandem Stance in Corner  - 1-2 x daily - 7 x weekly - 1 sets - 3 reps - 30 seconds  hold - Seated Gaze Stabilization with Head Rotation  - 1-2 x daily - 7 x weekly - 1 sets - 2 reps - 60 seconds  hold - Tandem Stance with Head Rotation  - 1 x daily - 7 x weekly - 1 sets - 4 reps - Backward Tandem Walking  - 1 x daily - 7 x weekly - 1 sets - 5 reps - Walking Tandem Stance  - 1 x daily - 7 x weekly - 1 sets - 5 reps - Lunge Onto BOSU Ball  - 1 x daily - 7 x weekly - 1-2 sets - 10 reps - Runner's Step Up on BOSU Ball  - 1 x daily - 7 x weekly - 1-2 sets - 10 reps   GOALS: Goals reviewed with patient? No  SHORT TERM GOALS: Target date: 01/25/24  Initial HEP will be prescribed by 3rd visit Baseline: not provided  Goal status: MET 01/20/24  2.  Headache pain will not increase above 7/10 during session.  Baseline: started at 7/10, pt verbalized it was worse at end of eval  Goal status: ongoing 01/21/24, MET 01/28/24  3.  Activity increase will not elicit vomiting. Baseline: vomited at end of eval after BERG testing. Goal status: ongoing 01/21/24, doing harder tasks in sessions no vomiting in 3 sessions 01/28/24, progressing 02/10/24, MET 03/09/24   LONG TERM GOALS: Target date: 06/20/24  Pt will be independent with advanced HEP, as prescribed throughout POC. Baseline: not prescribed yet Goal status: ongoing 02/20/24, ongoing 05/05/24  2.  Pt will decrease HA pain to </= 4/10 with activity.  Baseline: > 7/10 Goal status: progressing 02/25/24; 03/23/24- good days and bad days, ongoing and still wearing patches for nausea 04/06/24  3.  Pt will increase BERG balance test score to >/= 52/56. Baseline: 48/56 Goal status: MET 54/56 02/04/24  4.  Pt will complete full session without vomiting or any other adverse reactions.  Baseline:  vomited after eval Goal status: progressing 2 sessions back to back w/vomit, 2 sessions without, MET 02/10/24  5. Pt will complete SLS and tandem on firm and foam without LOB >10s   Baseline: 5s  Goal status: progressing 02/20/24 MET 03/23/24  6. Pt will be able to tolerate moderate to vigorous 45 mins PT session with minimal fatigue   Baseline: mild unsteadiness still present esp with fatigue  Goals status: ongoing 04/27/24    ASSESSMENT:  CLINICAL IMPRESSION: We continued to push patient with endurance and coordination tasks, adding in some multi step movements. Does well with harder level balance and coordination. SLS on BOSU was the hardest thing for him today.   OBJECTIVE IMPAIRMENTS: decreased activity tolerance, decreased balance, difficulty walking, decreased safety awareness, impaired perceived functional ability, increased muscle spasms, and pain.   ACTIVITY LIMITATIONS: standing, stairs, and locomotion level  PARTICIPATION LIMITATIONS: cleaning, laundry, driving, shopping, community activity, and occupation  PERSONAL FACTORS: 1 comorbidity: history of stroke are also affecting patient's functional outcome.   REHAB POTENTIAL: Good  CLINICAL DECISION MAKING: Evolving/moderate complexity  EVALUATION COMPLEXITY: Moderate  PLAN:  PT FREQUENCY: 1-2x/week  PT DURATION: 8 weeks  PLANNED INTERVENTIONS: 97164- PT Re-evaluation, 97110-Therapeutic exercises, 97530- Therapeutic activity, 97112- Neuromuscular re-education, 97535- Self Care, 16109- Manual therapy, and 97116- Gait training  PLAN FOR NEXT SESSION: renewal done, will continue 1x/week or every other week to maximize balance and function, work with elevated HR for higher level balance and cognition  Cherylene Corrente, PT 05/18/24 4:59 PM

## 2024-05-18 ENCOUNTER — Ambulatory Visit: Attending: Physical Medicine and Rehabilitation

## 2024-05-18 DIAGNOSIS — R262 Difficulty in walking, not elsewhere classified: Secondary | ICD-10-CM | POA: Diagnosis present

## 2024-05-18 DIAGNOSIS — R278 Other lack of coordination: Secondary | ICD-10-CM | POA: Insufficient documentation

## 2024-05-18 DIAGNOSIS — R41844 Frontal lobe and executive function deficit: Secondary | ICD-10-CM | POA: Insufficient documentation

## 2024-05-18 DIAGNOSIS — R2689 Other abnormalities of gait and mobility: Secondary | ICD-10-CM | POA: Diagnosis present

## 2024-05-18 DIAGNOSIS — R42 Dizziness and giddiness: Secondary | ICD-10-CM | POA: Diagnosis present

## 2024-05-18 DIAGNOSIS — M6281 Muscle weakness (generalized): Secondary | ICD-10-CM | POA: Diagnosis present

## 2024-05-18 DIAGNOSIS — R41842 Visuospatial deficit: Secondary | ICD-10-CM | POA: Insufficient documentation

## 2024-05-19 MED ORDER — SCOPOLAMINE 1 MG/3DAYS TD PT72: 1.0000 | MEDICATED_PATCH | TRANSDERMAL | 5 refills | Status: DC

## 2024-05-20 ENCOUNTER — Ambulatory Visit

## 2024-05-20 DIAGNOSIS — I614 Nontraumatic intracerebral hemorrhage in cerebellum: Secondary | ICD-10-CM

## 2024-05-20 DIAGNOSIS — Z9889 Other specified postprocedural states: Secondary | ICD-10-CM | POA: Diagnosis not present

## 2024-05-20 MED ORDER — GADOBENATE DIMEGLUMINE 529 MG/ML IV SOLN
20.0000 mL | Freq: Once | INTRAVENOUS | Status: AC | PRN
  Administered 2024-05-20: 20 mL via INTRAVENOUS

## 2024-05-25 ENCOUNTER — Ambulatory Visit: Admitting: Occupational Therapy

## 2024-05-25 ENCOUNTER — Encounter: Payer: Self-pay | Admitting: Occupational Therapy

## 2024-05-25 DIAGNOSIS — R2689 Other abnormalities of gait and mobility: Secondary | ICD-10-CM

## 2024-05-25 DIAGNOSIS — R41842 Visuospatial deficit: Secondary | ICD-10-CM

## 2024-05-25 DIAGNOSIS — M6281 Muscle weakness (generalized): Secondary | ICD-10-CM

## 2024-05-25 DIAGNOSIS — R278 Other lack of coordination: Secondary | ICD-10-CM

## 2024-05-25 DIAGNOSIS — R41844 Frontal lobe and executive function deficit: Secondary | ICD-10-CM

## 2024-05-25 NOTE — Therapy (Signed)
 OUTPATIENT OCCUPATIONAL THERAPY NEURO Treatment  Patient Name: Wayne Lee MRN: [id number] DOB:02-21-1976, 48 y.o., male Today's Date: 05/25/2024  PCP: none REFERRING PROVIDER: Dr. Dorn Gaskins  END OF SESSION:  OT End of Session - 05/25/24 1452     Visit Number 14    Number of Visits 16    Date for OT Re-Evaluation 05/28/24    Authorization Type aetna    Authorization Time Period 12 weeks    OT Start Time 1452    OT Stop Time 1540    OT Time Calculation (min) 48 min    Activity Tolerance Patient tolerated treatment well    Behavior During Therapy WFL for tasks assessed/performed                   Past Medical History:  Diagnosis Date   Atopic dermatitis in adult    Left cataract    Obesity (BMI 30-39.9)    Stroke [facility])    Undescended left testicle    had surgical intervention   Past Surgical History:  Procedure Laterality Date   CATARACT EXTRACTION W/ INTRAOCULAR LENS IMPLANT Left 2017   CHOLECYSTECTOMY     ORCHIOPEXY  1984   SUBOCCIPITAL CRANIECTOMY CERVICAL LAMINECTOMY N/A 12/16/2023   Procedure: SUBOCCIPITAL CRANIECTOMY;  Surgeon: Elna Haggis, MD;  Location: [facility] OR;  Service: Neurosurgery;  Laterality: N/A;   Patient Active Problem List   Diagnosis Date Noted   Chronic fatigue 02/12/2024   Nausea and vomiting 01/13/2024   Central nervous system origin vertigo, unspecified laterality 01/13/2024   Impaired mobility and ADLs 01/13/2024   Bilateral headaches 12/31/2023   ICH (intracerebral hemorrhage) (HCC) 12/20/2023   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   Hypertension 12/19/2023   Brain compression (HCC) 12/19/2023   Nontraumatic acute cerebral hemorrhage (HCC) 12/16/2023   Cerebellar hemorrhage (HCC) 12/16/2023   Chronic renal insufficiency, stage 3 (moderate) (HCC) 04/23/2022   Atopic dermatitis 12/20/2015   Chronic eczema 04/23/2011    ONSET DATE: 12/16/23  REFERRING DIAG: I61.9 (ICD-10-CM) - Nontraumatic acute cerebral  hemorrhage (HCC)   THERAPY DIAG:  Other lack of coordination  Muscle weakness (generalized)  Other abnormalities of gait and mobility  Visuospatial deficit  Frontal lobe and executive function deficit  Rationale for Evaluation and Treatment: Rehabilitation  SUBJECTIVE:   SUBJECTIVE STATEMENT: Pt reports doing a lot at home Pt accompanied by: significant other  PERTINENT HISTORY: 48 yo male arrival 1/6 with HTN 204/106 taken to emergent OR hematoma evacuation s/p suboccipital craniotomy and hypertonic saline started. Extubated 1/7 PMH obesity, sleep apnea, HLD d/c home 1/21/5 Per pt's wife AVM  CT Head without contrast: Large acute 5.3 cm intraparenchymal hemorrhage in the left cerebellum. Mass effect with narrowed fourth ventricle, but no hydrocephalus at this time. Basal cisterns are effaced and there is early ascending transtentorial herniation. PRECAUTIONS: Fall  WEIGHT BEARING RESTRICTIONS: No  PAIN: no pain today FALLS: Has patient fallen in last 6 months? No  LIVING ENVIRONMENT: Lives with: lives with their family and lives with their spouse Lives in: House/apartment Stairs: yes  Has following equipment at home: None  PLOF: Independent  PATIENT GOALS: return to prior level  OBJECTIVE:  Note: Objective measures were completed at Evaluation unless otherwise noted.  HAND DOMINANCE: Right  ADLs: Overall ADLs: mod I-supervision with basic ADLS Transfers/ambulation related to ADLs: Eating: mod I Grooming: mod I UB Dressing: mod I LB Dressing: mod I Toileting: mod I Bathing: supervision, sits in bottom of bathtub Tub Shower transfers: supervision  IADLs:dependent for IADLS Medication management: wife is helping  Financial management: dependent   MOBILITY STATUS: mod I-supervision    ACTIVITY TOLERANCE: Activity tolerance: limited by nausea   UPPER EXTREMITY ROM:  WFLS   UPPER EXTREMITY MMT:     MMT Right eval Left eval  Shoulder  flexion 4+/5 4/5  Shoulder abduction    Shoulder adduction    Shoulder extension    Shoulder internal rotation    Shoulder external rotation    Middle trapezius    Lower trapezius    Elbow flexion 4+/5 4+/5  Elbow extension 4+/5 4/5  Wrist flexion    Wrist extension    Wrist ulnar deviation    Wrist radial deviation    Wrist pronation    Wrist supination    (Blank rows = not tested)  HAND FUNCTION: Grip strength: Right: 63 lbs; Left: 48 lbs  COORDINATION: 9 Hole Peg test: Right: 26.25 sec; Left: 32.80 with several drops sec  SENSATION: WFL  COGNITION: Overall cognitive status: recalls 3/3 words following short delay, spells WORLD backwards without difficulty  VISION: Subjective report: Pt reports double vision at times   VISION ASSESSMENT: To be further assessed in functional context Pt reports diplopia at times He was able to track to all 4 quadrants and visual fiels were intact. He became nauseous and vomited shortly after vision testing. Pt appears to have some vestibular issues.  Patient has difficulty with following activities due to following visual impairments: reading    OBSERVATIONS: Pleasant agreeable male, accompanied by his wife and 1 y.o son Pt's BP was elevated at end of session when he was vomiting 140/102, and 148/115, pt was instructed to monitor at home and to contact MD if diastolic BP does not reduce to below 100. pt's wife verbalized understanding                                                                                                                             TREATMENT DATE:05/25/24- Therapist checked progress towards goals. Pt would like to work on exercises at home at this time and place OT on hold. If pt. does not have additional OT needs in 60 days will plan to d/c. Pt is in agreement. Pt states that Dr. Janett Medin cleared him to return to driving and that he has driven with his wife then alone. Pt was cautioned against highway driving  and driving when he is tired.  Pt ambulated with items in left and right hands in clinic without drops. Therapist discussed the need for compensation (arm close to body, firm grip and attending to left hand) when holding items in his left hand to prevent from dropping. Pt verbalized understanding. Pt reports he has a home gym. Therapist educated pt in proper positioning for use of theragym (pt reports he has weight machine at home). Pt utilized 5lbs weight for left and right hands for shoulder flexion, and extension with weight machine, pt perfromed biceps curls  with machine as well as 5 lbs weight as well as overhead press. Pt was cuationed against using much heavier weight due to risk for injury and falls.(Pt demonstrated a squat and nearly lost his balance, he was cautioned against performing). Therapist re-printed exercise flow sheet and added newly recommended exercises. Pt reports he understands the exercises without handouts.  04/27/24-Discussed progress towards goals.  Pt reports he continues to drop items at home. Pt ambulated 150 ft while carrying simulated plate of food(marbles) in left hand and glass of water in right hand. Pt switched hands for carrying the items. Pt was able to carry without dropping. Pt was instructed in an updated HEP for LUE control with a 2 lbs weight, for strengthening left wrist and then shoulder flexion and abduction focusing on contol. min v.c Pt practiced touching overhead shelf with weight then bring back down to countertop, with LUE min difficulty/ v.c for control. Discussion regarding importance of increasing activity level at home and use of an exercise flow sheet.   03/26/24- Therapist checked progress towards goals, plans for updated goals and discussed plans for OT moving forward. Functional reaching into cabinets from low to high, no LOB, however increased dizziness. Pt is noted to move too quickly at times. Therapist discussed importance of slowing  down. Pt reports continued coordination difficulties with LUE.  Therapist discussed activities to continue at home: theraband, putty, tossing ball and performing category generation. UBE x 5 mins level 4 for conditioning.   03/23/24-discussed how pt's recent work activities went. 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  Therapist reviewed coordination activities to perfrom at home: stacking and manipulating coins, flipping and dealing cards, rotating a ball in hand, twirtling pen, min v.c to slow down. Placing grooved pegs into pegboard with LUE and removing with tweezers for increased fine motor coordination, min difficulty v.c Gripper set at level 3 sustained grip to pick up blocks, min difficulty/ v.c see pt instructions  03/16/24- UBE x 6 mins level 3 for conditioning simulated work activities; including writing pro's and con's for attending in person activities for graduation and pinning ceremony. 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 Therapist provided min v.c for organization/ planning.  03/09/24- Bell's test; 32/34 located in 4 mins 37 secs. Pt located remaining 2 with min v.c  Organizing your day problem #1 for organization and attention to detail. Pt made 1 error. Pt scheduled himself to pick up a cake right before MD appointment, however he overlooked the fact that the cake is supposed to be an ice cream cake. Pt/ therapist discussed organization strategies he is using at work.  5/25- environmental scanning 14/14 items located in sequential order with mild dizziness tabletop scanning task 77/80 items located mild diplopia. Pt reports work activities are going well. Pt was encouraged to continue to take more frequent rest and brain breaks to avoid over fatigue. Pt cooked shrimp this weekend without issues and he cared for  his 2 y.o son.  Reviewed green theraband HEP. 15 reps each, min v.c  02/19/24- Organizing your day task for attention to detail, and reading tolerance in prep for return to work . Pt has mod difficulty with task. He initally omitted 2 items and he did not account for the tempeture when buying groceries. He alos missed the detail requiring him to register  for class at a specific time. Activity took pt the entire session and he reuired v.c to correct errors. Pt reported fatigue with task at end of session. Therapist discsussed implications with similar activities with return to work and discussed memory strategies and reinforced improtance of writing things down rather than trying to remember. Pt returns to work next week.  3/03/25Reading activity to read larger print items on the I pad then smaller article on the computer. Pt reports it is not blurry but he has some visual fatigue. Pt was instructed in theraband exercises: green band for shoulder abduction, then blue for remaining exercises- see pt instructions, 15 reps each, min v.c for positioning. Pt was isntructed that he can try blue band for shoulder abduction, however he should stop if he has any increased pain.      PATIENT EDUCATION: Education details:   updated HEP with 5 lbs weight, see above Person educated: Patient  Education method: Explanation, demonstration, v.c , handout Education comprehension: verbalized understanding, returned demonstration,   HOME EXERCISE PROGRAM: coordination, putty, HEP with weights   GOALS: Goals reviewed with patient? Yes  SHORT TERM GOALS:   I with HEP  Goal status: met, 01/22/24- demonstrates understanding of putty exercises,   2.  Pt will demonstrate improved fine motor coordination as evidenced by decreasing 9 hole peg test LUE to 29 secs or less without several drops.  Goal status:met, 27.07 secs,  03/26/24- 26.63   3.  Pt will increase LUE grip strength to 52 lbs or greater for  increased ease with daily activities.  Goal status: met, 63 lbs, 03/26/24- 65 lbs  4.  Pt will perfrom tabletop scanning without diplopia or vomiting with 90% or better accuracy.  Goal status: met 03/09/24, Bell's test 94% accuracy  5.  Pt will perfrom transitional movements for ADLS/ IADLs without LOB or nausea greater than 3/10.  Goal status: met, dizziness 6/10, small headache  6. I with adapted strategies for ADLs/IADLs to improve safety and I.   Goal status: met, v.c to slow down  LONG TERM GOALS: Target date:   I with updated HEP  Goal status; met, theraband  2.  Pt will perform mod complex home management mod I  Goal status: met laundry and simple cooking mod I  3.  Pt will perform environmental scanning in a busy environment with 90% or better accuracy, without vomiting  Goal status:  met,   4.  Pt will perfrom simulated work activities mod I  Goal status: met, pt reports fatigue at times however performing mod I  5. Pt will demonstrate ability to carry an item in left and right hand simultaneously without drops.           goal status: partially met ,  met in clinic, pt reports drops at home 05/25/24  6. I with updates to HEP  goal status: met, pt demonstrates undertanding, 05/25/24  7.  Pt will demonstrate improved endurance for ADLS/IADLs with pt reporting ability to stand for daily activities for 1 hour with no more than a 10 min break.       Goal status:not met, per pt report standing for up to 45 mins at a time max. 05/25/24  8. Pt will demonstrate ability to problems solve strategies for work activities should new challenges arise.     goal status: met, per pt report 05/25/24    ASSESSMENT:  CLINICAL IMPRESSION:Pt made progress towards goals. Pt met 6/8 long term goals. He desires to work  on HEP and endurance at home. Pt placed on hold and he will be able to return to therapy in the future if he has additional needs. Pt reports Dr. Janett Medin cleared him to drive  and he is back to driving. Pt was cautioned against highway driving and driving when he is tired.    PERFORMANCE DEFICITS: in functional skills including ADLs, IADLs, coordination, dexterity, ROM, strength, Fine motor control, Gross motor control, balance, endurance, decreased knowledge of precautions, decreased knowledge of use of DME, vision, UE functional use, and vestibular, cognitive skills including safety awareness, and psychosocial skills including coping strategies, environmental adaptation, habits, interpersonal interactions, and routines and behaviors.   IMPAIRMENTS: are limiting patient from ADLs, IADLs, rest and sleep, work, play, leisure, and social participation.   CO-MORBIDITIES: may have co-morbidities  that affects occupational performance. Patient will benefit from skilled OT to address above impairments and improve overall function.  MODIFICATION OR ASSISTANCE TO COMPLETE EVALUATION: Min-Moderate modification of tasks or assist with assess necessary to complete an evaluation.  OT OCCUPATIONAL PROFILE AND HISTORY: Detailed assessment: Review of records and additional review of physical, cognitive, psychosocial history related to current functional performance.  CLINICAL DECISION MAKING: LOW - limited treatment options, no task modification necessary  REHAB POTENTIAL: Good  EVALUATION COMPLEXITY: Low    PLAN:  OT FREQUENCY:5 visits  OT DURATION: 9 weeks  PLANNED INTERVENTIONS: 97168 OT Re-evaluation, 97535 self care/ADL training, 19147 therapeutic exercise, 97530 therapeutic activity, 97112 neuromuscular re-education, 97140 manual therapy, 97116 gait training, 82956 aquatic therapy, 97035 ultrasound, 97018 paraffin, 21308 moist heat, 97010 cryotherapy, 97034 contrast bath, balance training, functional mobility training, visual/perceptual remediation/compensation, energy conservation, coping strategies training, patient/family education, and DME and/or AE  instructions  RECOMMENDED OTHER SERVICES: PT  CONSULTED AND AGREED WITH PLAN OF CARE: Patient  PLAN FOR NEXT SESSION:   Pt placed on hold at this time, pt to contact therapist within 60 days if he desires to return to therapy.  Fareeda Downard, OT 05/25/2024, 4:07 PM                   Zyire Eidson, OT 05/25/2024, 4:07 PM

## 2024-05-29 ENCOUNTER — Ambulatory Visit: Payer: Self-pay | Admitting: Neurology

## 2024-05-29 NOTE — Progress Notes (Signed)
 Kindly inform the patient that MRI scan of the brain shows evidence of previous surgery in the back of the head with fluid collection and old brain hemorrhage on the left side in the back of the brain.  No new or worrisome findings

## 2024-06-08 ENCOUNTER — Encounter: Payer: Self-pay | Admitting: Physical Therapy

## 2024-06-08 ENCOUNTER — Ambulatory Visit: Admitting: Physical Therapy

## 2024-06-08 ENCOUNTER — Ambulatory Visit: Admitting: Occupational Therapy

## 2024-06-08 DIAGNOSIS — R42 Dizziness and giddiness: Secondary | ICD-10-CM

## 2024-06-08 DIAGNOSIS — M6281 Muscle weakness (generalized): Secondary | ICD-10-CM

## 2024-06-08 DIAGNOSIS — R278 Other lack of coordination: Secondary | ICD-10-CM | POA: Diagnosis not present

## 2024-06-08 DIAGNOSIS — R262 Difficulty in walking, not elsewhere classified: Secondary | ICD-10-CM

## 2024-06-08 DIAGNOSIS — R41842 Visuospatial deficit: Secondary | ICD-10-CM

## 2024-06-08 NOTE — Therapy (Signed)
 OUTPATIENT PHYSICAL THERAPY NEURO TREATMENT    Patient Name: Lincoln Ginley MRN: [id number] DOB:[birth year], 48 y.o., male Today's Date: 06/08/2024   PCP: No PCP REFERRING PROVIDER: Sharlet Schmitz, PA-C  END OF SESSION:  PT End of Session - 06/08/24 0927     Visit Number 25    Date for PT Re-Evaluation 06/20/24    Authorization Type [employer]    PT Start Time [phone]    PT Stop Time 1015    PT Time Calculation (min) 48 min    Activity Tolerance Patient tolerated treatment well    Behavior During Therapy [facility] for tasks assessed/performed           Past Medical History:  Diagnosis Date   Atopic dermatitis in adult    Left cataract    Obesity (BMI 30-39.9)    Stroke [facility])    Undescended left testicle    had surgical intervention   Past Surgical History:  Procedure Laterality Date   CATARACT EXTRACTION W/ INTRAOCULAR LENS IMPLANT Left 2017   CHOLECYSTECTOMY     ORCHIOPEXY  1984   SUBOCCIPITAL CRANIECTOMY CERVICAL LAMINECTOMY N/A 12/16/2023   Procedure: SUBOCCIPITAL CRANIECTOMY;  Surgeon: Colon Shove, MD;  Location: [facility] OR;  Service: Neurosurgery;  Laterality: N/A;   Patient Active Problem List   Diagnosis Date Noted   Chronic fatigue 02/12/2024   Nausea and vomiting 01/13/2024   Central nervous system origin vertigo, unspecified laterality 01/13/2024   Impaired mobility and ADLs 01/13/2024   Bilateral headaches 12/31/2023   ICH (intracerebral hemorrhage) (HCC) 12/20/2023   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   Hypertension 12/19/2023   Brain compression (HCC) 12/19/2023   Nontraumatic acute cerebral hemorrhage (HCC) 12/16/2023   Cerebellar hemorrhage (HCC) 12/16/2023   Chronic renal insufficiency, stage 3 (moderate) (HCC) 04/23/2022   Atopic dermatitis 12/20/2015   Chronic eczema 04/23/2011    ONSET DATE: 12/15/22  REFERRING DIAG:  Diagnosis  I61.9 (ICD-10-CM) - Nontraumatic acute cerebral hemorrhage (HCC)    THERAPY DIAG:  Muscle  weakness (generalized)  Visuospatial deficit  Difficulty in walking, not elsewhere classified  Dizziness and giddiness  Rationale for Evaluation and Treatment: Rehabilitation  SUBJECTIVE:                                                                                                                                                                                             SUBJECTIVE STATEMENT:  I went to [place], really did okay but needed a lot of recovery, I was fatigued, dizzy at times and off balance occasionally but it was a lot physically and mentally  Pt accompanied by: self  PERTINENT HISTORY:  1/6 stroke, ICU and IRF leaving hospital 1/21  PAIN:  Are you having pain? No 0/10   PRECAUTIONS: None  RED FLAGS: None   WEIGHT BEARING RESTRICTIONS: No  FALLS: Has patient fallen in last 6 months? No  LIVING ENVIRONMENT: Lives with: lives with their family and lives with their spouse Lives in: House/apartment Stairs: Yes: Internal: 15 steps; can reach both and External: 2 steps; none front door 4 steps rails on both sides Has following equipment at home: None  PLOF: supervision for I/ADLs  PATIENT GOALS: walking, energy, endurance  OBJECTIVE:  Note: Objective measures were completed at Evaluation unless otherwise noted.  DIAGNOSTIC FINDINGS:  Nothing new  COGNITION: Overall cognitive status: Within functional limits for tasks assessed   SENSATION: WFL  COORDINATION: Toe taps and heel-to-shin normal  EDEMA:  None  MUSCLE TONE: RLE: Within functional limits  MUSCLE LENGTH: NT  POSTURE: very wide BOS   LOWER EXTREMITY ROM:   WFL   Active  Right Eval Left Eval  Hip flexion    Hip extension    Hip abduction    Hip adduction    Hip internal rotation    Hip external rotation    Knee flexion    Knee extension    Ankle dorsiflexion    Ankle plantarflexion    Ankle inversion    Ankle eversion     (Blank rows = not tested)  LOWER EXTREMITY  MMT:    MMT Right Eval Left Eval  Hip flexion 5/5 5/5  Hip extension    Hip abduction 5/5 5/5  Hip adduction 5/5 5/5  Hip internal rotation    Hip external rotation    Knee flexion 5/5 5/5  Knee extension 5/5 5/5  Ankle dorsiflexion    Ankle plantarflexion    Ankle inversion    Ankle eversion    (Blank rows = not tested)  BED MOBILITY:  Sit to supine Complete Independence and Modified independence Supine to sit Modified independence Rolling to Right Complete Independence Rolling to Left Complete Independence  TRANSFERS: Assistive device utilized: None  Sit to stand: Modified independence Stand to sit: Complete Independence Chair to chair: Modified independence Floor: NT   STAIRS: Level of Assistance: NT Stair Negotiation Technique:  with  Number of Stairs:   Height of Stairs:   Comments: NT  GAIT: Gait pattern: wide BOS, R lateral lean using wall to balance on the way back to room Distance walked: 50 ft Assistive device utilized: None Level of assistance: Min A with HHA from wife (pt did not want to, but was leaning) Comments: could potentially use AD  FUNCTIONAL TESTS:  5 times sit to stand: 9.26 seconds Berg Balance Scale: 48/56  PATIENT SURVEYS:  NT today                                                                                                                              TREATMENT DATE: 06/08/24 Outside walk, then  jogs up hill Side shuffles, backward jog Direction changes Getting up and down from the floor 3# turkish get up Crunches Discussion of PT D/C, walking program, VOR exercises, gym exercises, safety  05/18/24 Walk outdoors 2 laps  Quick taps 4 30s  On BOSU squat and throw yellow ball 2x10 SLS on BOSU Knee to elbow standing crunch with 4# 20 reps alt  Ployometrics on leg press 40# 20s x3  05/05/24 Elliptical L4 x2mins  Step up to single limb stance x10 Jumping jacks 20s x2  SL RDL 5# weight x10 3 way cone taps on airex   Squat to OHP throw with yellow ball 2x10   04/27/24 Elliptical LEvel 5 x 3 minutes Gait outside with 4# biceps, OHP and punches On bosu 2 ways ball toss Resisted gait all directions 5# SLS DL on and off airex with CGA and hand on wall. 10# holding overhead in single arm STS SLS ball toss Small jumps, some questions when HR is elevated like math and state capitals etc.. HR up to 160 bpm  04/20/24 Nustep level 5 x 5 minutes Bike Level 5 x 5 minutes power burst Tmill push 20 seconds x 3  Skipping Side to side jumping  Front to back jumping Bosu step ups Mat step ups [employer] Side shuffles On bosu reaching, ball toss  04/06/24 Walk outdoors 2 laps Elliptical L3 x34mins  Squat and row 20# 2x10  Shuffling  Side step into SL on BOSU x10 each side- needs bars to hold on  Plank 20s x2, plank and reach (very hard, unable to keep trunk from rotating, 4 taps at best before rolling over)   03/31/24 Elliptical L3x33mins  NuStep L5x51mins  Resisted gait with step up forward and side ways 50# x5  Treadmill pushes forward and backwards 30s x2  [employer] with blue ball 2x10  STS with 8# each arm OHP 2x10  Side steps on beam with catch  03/23/24  Nustep L6x8 minutes SPM >90 BLEs only  FGA 28/30, SLS and tandem on foam testing    Tandem stance on blue foam pad + head turns x6 rounds Tandem walks blue foam pad x4 rounds Forward lunges onto BOSU x10 B Lateral lunges onto BOSU x10 B Runners step up onto BOSU x10 B  Education on results of testing today and plan to hold PT for now, DC if we do not hear back within 30 days     03/18/24 Bike with 2 power bursts L4 x81mins  Resisted hold 50# doing cone taps, then on airex-- some LOB On BOSU catch, then multi-tasking counting backwards from 50 by 2, then counting forward by 3s  Trampoline jumps- single leg, double leg, in and out, scissoring 2x10 HS curls 45# 2x10 Leg ext 25# 2x10 Plyo pushes on leg press 20# 20s x2     03/09/24 Elliptical L5 x34mins  Resisted gait on to airex 40# x5 leading each side  On airex straight arm pulls 2x10 Quick taps 4 step  Ladder drills -quick steps in and out laterally, then 1 foot -two feet in and out of each box  -big steps skipping over squares -jumping two feet in box and out Walk outdoors 2 laps    03/04/24  Elliptical L6 x8 minutes for w/u and neural priming  Walking outside down/up hills and curbs with ball toss to self min guard Grape vines down and up hill Walking down hill forward/up backwards with ball toss to self Walking down backwards/up forwards with  ball toss to self Sideways high knees + lateral step overs + BOSU kicks + backwards figure 8 walks (3 rounds) Standing on soft surface of BOSU 30 seconds + dribbling yellow weighted ball between cones + lateral alternating cone taps + step over BOSU 1 round     02/25/24 Nustep level 5 x 5 minutes keeping above 70 SPM Walking outside around building good pace, no rest, some ball [employer] kicking On rocker board 2 ways On bosu reaching for numbers On bosu ball toss On airex 15# extension On airex 10# chest press Side step on and off airex and then with ball toss Skipping Side shuffling  02/24/24:  Gait outdoors on asphalt and sidewalk, curb, for 8 min, occasional weaving, self corrects Bosu on smooth side , throwing and catching a ball Bosu on smooth side in ll bars, reaching forward to targets, then facing side ways and reaching with same hand and across body to opposite side Resisted gait 5 x each direction 50#, lost balance more with walking backwards then change to controlling forward pace RDL's single leg, to elevated hi/low mat, 5 x each leg In ll bars for forward toe taps to cone, from 8 step In ll bars for vertical jumps x 30 sec Side weaving, over/ under, x 20' x 2 each direction, walking slowly PATIENT EDUCATION: Education details: Diagnosis, Prognosis, POC, holding off on  HEP Person educated: Patient and Spouse Education method: Explanation, Demonstration, Tactile cues, and Verbal cues Education comprehension: verbalized understanding, returned demonstration, verbal cues required, tactile cues required, and needs further education  HOME EXERCISE PROGRAM:  Access Code: [email or web address] URL: [email or web address] Date: 03/23/2024 Prepared by: Josette Rough  Exercises - Tandem Stance in Corner  - 1-2 x daily - 7 x weekly - 1 sets - 3 reps - 30 seconds  hold - Seated Gaze Stabilization with Head Rotation  - 1-2 x daily - 7 x weekly - 1 sets - 2 reps - 60 seconds  hold - Tandem Stance with Head Rotation  - 1 x daily - 7 x weekly - 1 sets - 4 reps - Backward Tandem Walking  - 1 x daily - 7 x weekly - 1 sets - 5 reps - Walking Tandem Stance  - 1 x daily - 7 x weekly - 1 sets - 5 reps - Lunge Onto BOSU Ball  - 1 x daily - 7 x weekly - 1-2 sets - 10 reps - Runner's Step Up on BOSU Ball  - 1 x daily - 7 x weekly - 1-2 sets - 10 reps   GOALS: Goals reviewed with patient? No  SHORT TERM GOALS: Target date: 01/25/24  Initial HEP will be prescribed by 3rd visit Baseline: not provided  Goal status: MET 01/20/24  2.  Headache pain will not increase above 7/10 during session.  Baseline: started at 7/10, pt verbalized it was worse at end of eval  Goal status: ongoing 01/21/24, MET 01/28/24  3.  Activity increase will not elicit vomiting. Baseline: vomited at end of eval after BERG testing. Goal status: ongoing 01/21/24, doing harder tasks in sessions no vomiting in 3 sessions 01/28/24, progressing 02/10/24, MET 03/09/24   LONG TERM GOALS: Target date: 06/20/24  Pt will be independent with advanced HEP, as prescribed throughout POC. Baseline: not prescribed yet Goal status: Met 06/08/24  2.  Pt will decrease HA pain to </= 4/10 with activity.  Baseline: > 7/10 Goal status: progressing 02/25/24; 03/23/24- good days and bad days, ongoing and  still wearing  patches for nausea 04/06/24 met 06/08/24  3.  Pt will increase BERG balance test score to >/= 52/56. Baseline: 48/56 Goal status: MET 54/56 02/04/24  4.  Pt will complete full session without vomiting or any other adverse reactions.  Baseline: vomited after eval Goal status: progressing 2 sessions back to back w/vomit, 2 sessions without, MET 02/10/24  5. Pt will complete SLS and tandem on firm and foam without LOB >10s   Baseline: 5s  Goal status: progressing 02/20/24 MET 03/23/24  6. Pt will be able to tolerate moderate to vigorous 45 mins PT session with minimal fatigue   Baseline: mild unsteadiness still present esp with fatigue  Goals status: ongoing 04/27/24, met 06/08/24    ASSESSMENT:  CLINICAL IMPRESSION: We did a lot of discussion regarding progression of exercises, walking program/plan and safety as well as VOR exercises.  He asked a lot of good questions and feels that he is ready to e discharged, we reviewed the above.     OBJECTIVE IMPAIRMENTS: decreased activity tolerance, decreased balance, difficulty walking, decreased safety awareness, impaired perceived functional ability, increased muscle spasms, and pain.   ACTIVITY LIMITATIONS: standing, stairs, and locomotion level  PARTICIPATION LIMITATIONS: cleaning, laundry, driving, shopping, community activity, and occupation  PERSONAL FACTORS: 1 comorbidity: history of stroke are also affecting patient's functional outcome.   REHAB POTENTIAL: Good  CLINICAL DECISION MAKING: Evolving/moderate complexity  EVALUATION COMPLEXITY: Moderate  PLAN:  PT FREQUENCY: 1-2x/week  PT DURATION: 8 weeks  PLANNED INTERVENTIONS: 97164- PT Re-evaluation, 97110-Therapeutic exercises, 97530- Therapeutic activity, [id number]- Neuromuscular re-education, 97535- Self Care, [id number]- Manual therapy, and 97116- Gait training  PLAN FOR NEXT SESSION: D/C goals met  Ozell Mainland, PT 06/08/24 9:28 AM

## 2024-06-09 NOTE — Telephone Encounter (Signed)
-----   Message from Eather Popp sent at 05/29/2024  4:41 PM EDT ----- Burna inform the patient that MRI scan of the brain shows evidence of previous surgery in the back of the head with fluid collection and old brain hemorrhage on the left side in the back of the  brain.  No new or worrisome findings ----- Message ----- From: Margaret Eduard SAUNDERS, MD Sent: 05/22/2024   6:11 PM EDT To: Eather GORMAN Popp, MD

## 2024-06-09 NOTE — Telephone Encounter (Signed)
 Called the patient's and his wife answered. She was able to also bring the pt on the call and I reviewed the results with them. They verbalized understanding and had no further questions.

## 2024-06-18 ENCOUNTER — Inpatient Hospital Stay: Payer: Self-pay | Admitting: Neurology

## 2024-11-10 NOTE — Progress Notes (Unsigned)
 Subjective:    Patient ID: Wayne Lee, male    DOB: 04/09/1976, 48 y.o.   MRN: [id number]  HPI   Wayne Lee is a 48 y.o. year old male  who  has a past medical history of Atopic dermatitis in adult, Left cataract, Obesity (BMI 30-39.9), Stroke (HCC), and Undescended left testicle.   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   Plan from last visit: Nontraumatic acute cerebral hemorrhage (HCC) Central nervous system origin vertigo, unspecified laterality Doing much better now, ongoing nausea and fatigue remain challenging.   Continue gradual increase in recreational activities, family events.  Make sure you remain well-hydrated and give yourself lots of opportunities for cognitive and physical rest breaks during your trip to [place].   Follow-up in approximately 6 months, or sooner if needed   Nausea and vomiting, unspecified vomiting type Continue scopolamine  patch; will order 20 patches per refill due to tendency for patches to fall off.   Did discuss switching over to meclizine , however as needed meclizine  made patient more fatigued.  Has not required Valium , will not refill.   Chronic fatigue continues to be a challenge, but is improving.  Increase activity tolerance gradually.   Impaired mobility and ADLs Continue PT, no new DME requirements at this time   Interval Hx:  Discussed the use of AI scribe software for clinical note transcription with the patient, who gave verbal consent to proceed.  History of Present Illness   Wayne Lee is a 48 year old male with a history of stroke who presents with ongoing fatigue and issues related to scopolamine  patch use.  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 He is not walking for exercise despite thinking it may help his fatigue.  He uses scopolamine  patches for post-stroke dizziness and headaches. Without the patch he develops dizziness, headaches, "tingly" brain sensations, and warmth in his stomach, which resolve when he applies the patch. Patches often fall off early so he secures them with medical tape. He denies recent falls or significant dizziness while driving.  He has had two falls since his stroke, one at home and one during physical therapy, which he attributes to carelessness. He sometimes veers to the left when walking but self-corrects and has had no recent falls. He denies smoking and denies headaches or head pain while using the patch. He has brief lightheadedness and dizziness with head turning that resolves quickly.         Pain Inventory Average Pain 5 Pain Right Now 0 My pain is intermittent, sharp, and tingling  LOCATION OF PAIN  headache, abdomen  BOWEL Number of stools per week: 7 Oral laxative use No  Type of laxative none  BLADDER Normal    Mobility walk without assistance ability to climb steps?  yes do you drive?  yes Do you have any goals in this area?  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  no  Physicians involved in your care Any changes since last visit?  no   Family History  Problem Relation Age of Onset   Heart Problems Mother    Cancer Father  Cancer Maternal Grandfather    Social History   Socioeconomic History   Marital status: Married    Spouse name: Not on file   Number of children: Not on file   Years of education: Not on file   Highest education level: Not on file  Occupational History   Not on file  Tobacco Use   Smoking status: Former    Types: Cigarettes    Start date: 2005    Quit date: 1996    Years since quitting:  29.9   Smokeless tobacco: Never  Vaping Use   Vaping status: Never Used  Substance and Sexual Activity   Alcohol  use: Never   Drug use: Never   Sexual activity: Not on file  Other Topics Concern   Not on file  Social History Narrative   Not on file   Social Drivers of Health   Financial Resource Strain: Low Risk  (01/01/2024)   Received from [facility]   Overall Financial Resource Strain (CARDIA)    Difficulty of Paying Living Expenses: Not hard at all  Food Insecurity: No Food Insecurity (01/01/2024)   [facility]   Hunger Vital Sign    Within the past 12 months, you worried that your food would run out before you got the money to buy more.: Never true    Within the past 12 months, the food you bought just didn't last and you didn't have money to get more.: Never true  Transportation Needs: No Transportation Needs (01/01/2024)   Received from [facility] - Transportation    Lack of Transportation (Medical): No    Lack of Transportation (Non-Medical): No  Physical Activity: Unknown (09/02/2023)   Received from [facility]   Exercise Vital Sign    On average, how many days per week do you engage in moderate to strenuous exercise (like a brisk walk)?: 0 days    Minutes of Exercise per Session: Not on file  Stress: No Stress Concern Present (09/02/2023)   Received from [facility] - Occupational Stress Questionnaire    Feeling of Stress : Not at all  Social Connections: Socially Integrated (09/02/2023)   Received from [facility]   Social Network    How would you rate your social network (family, work, friends)?: Good participation with social networks   Past Surgical History:  Procedure Laterality Date   CATARACT EXTRACTION W/ INTRAOCULAR LENS IMPLANT Left 2017   CHOLECYSTECTOMY     ORCHIOPEXY  1984   SUBOCCIPITAL CRANIECTOMY CERVICAL LAMINECTOMY N/A 12/16/2023   Procedure: SUBOCCIPITAL  CRANIECTOMY;  Surgeon: Colon Shove, MD;  Location: [facility] OR;  Service: Neurosurgery;  Laterality: N/A;   Past Medical History:  Diagnosis Date   Atopic dermatitis in adult    Left cataract    Obesity (BMI 30-39.9)    Stroke [facility])    Undescended left testicle    had surgical intervention   There were no vitals taken for this visit.  Opioid Risk Score:   Fall Risk Score:  `1  Depression screen [facility] 2/9     05/13/2024    3:32 PM 02/12/2024    3:18 PM 01/13/2024    8:42 AM  Depression screen PHQ 2/9  Decreased Interest 0 0 0  Down, Depressed, Hopeless 0 0 0  PHQ - 2 Score 0 0 0  Altered sleeping   2  Tired, decreased energy   2  Change in appetite   2  Feeling bad or failure about yourself    0  Trouble concentrating   1  Moving slowly or fidgety/restless   0  Suicidal thoughts   0  PHQ-9 Score   7   Difficult doing work/chores   Very difficult     Data saved with a previous flowsheet row definition    Review of Systems  Gastrointestinal:  Positive for abdominal pain.  Neurological:  Positive for headaches.  All other systems reviewed and are negative.      Objective:   Physical Exam   Constitution: Appropriate appearance for age. No apparent distress. Resp: No respiratory distress. No accessory muscle usage. on RA and CTAB Cardio: Well perfused appearance No peripheral edema. Abdomen: Nondistended. Nontender.   Psych: improving affect, appropriate mood.  Neuro: AAOx4. No apparent cognitive deficits.    Neurologic Exam:   Eyes: No nystagmus   PERRLA. EOMI.  DTRs: Reflexes were 2+  Babinsky: flexor responses b/l.   Hoffmans: negative b/l Sensory exam: revealed normal sensation in all dermatomal regions in bilateral upper extremities and bilateral lower extremities Motor exam: strength 5/5 throughout bilateral upper extremities and bilateral lower extremities Coordination: Fine motor coordination was normal.  No ataxia.  Gait: normal; signifcant balance issue with  falling to the left in tandem gait     Assessment & Plan:   Wayne Lee is a 48 y.o. year old male  who  has a past medical history of Atopic dermatitis in adult, Left cataract, Obesity (BMI 30-39.9), Stroke (HCC), and Undescended left testicle.    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   Assessment and Plan    Fatigue and balance impairment secondary to prior cerebral infarction Fatigue persists post-stroke, exacerbated by long work hours. Balance impairment with leftward drift, no falls, good safety awareness. Cleared for light weightlifting, advised focus on cardio and balance. - Recommended walking on uneven surfaces to strengthen balance. - Suggested finding a gym for balance exercises and cardio. - Advised consulting a personal trainer for balance assessment and exercises. - Encouraged gradual progression in weightlifting, focusing on cardio first.  Dizziness and headaches related to scopolamine  patch use Dizziness and headaches occur without scopolamine  patch, resolve with use. No symptoms during activities when patch is applied. - Continue using scopolamine  patches as needed. - Refilled scopolamine  patches prescription.  Management of scopolamine  patch for neurological symptoms Scopolamine  patches manage dizziness and headaches. Patches secured with medical tape, insurance covers two per day. - Continue using medical tape to secure scopolamine  patches. - Refilled scopolamine  patches prescription.

## 2024-11-11 ENCOUNTER — Encounter: Payer: Self-pay | Admitting: Physical Medicine and Rehabilitation

## 2024-11-11 ENCOUNTER — Encounter: Admitting: Physical Medicine and Rehabilitation

## 2024-11-11 VITALS — BP 128/84 | HR 87 | Ht 68.0 in | Wt 244.0 lb

## 2024-11-11 DIAGNOSIS — R5382 Chronic fatigue, unspecified: Secondary | ICD-10-CM | POA: Diagnosis not present

## 2024-11-11 DIAGNOSIS — R112 Nausea with vomiting, unspecified: Secondary | ICD-10-CM | POA: Diagnosis present

## 2024-11-11 DIAGNOSIS — I619 Nontraumatic intracerebral hemorrhage, unspecified: Secondary | ICD-10-CM | POA: Diagnosis present

## 2024-11-11 MED ORDER — SCOPOLAMINE 1 MG/3DAYS TD PT72: 1.0000 | MEDICATED_PATCH | TRANSDERMAL | 11 refills | Status: AC

## 2024-11-11 NOTE — Patient Instructions (Addendum)
  VISIT SUMMARY: Today we discussed your ongoing fatigue and issues related to the use of scopolamine  patches. We reviewed your symptoms and provided recommendations to help manage your fatigue and balance, as well as your dizziness and headaches.  YOUR PLAN: FATIGUE AND BALANCE IMPAIRMENT: Your fatigue and balance issues are likely related to your previous stroke and are made worse by your long work hours. -Start walking on uneven surfaces to help improve your balance. -Consider joining a gym to focus on balance exercises and cardio workouts. -Consult a personal trainer to assess your balance and recommend specific exercises. -Gradually progress in weightlifting - Follow up in 1 year or sooner if needed  DIZZINESS AND HEADACHES RELATED TO SCOPOLAMINE  PATCH USE: You experience dizziness and headaches when you do not use the scopolamine  patch, but these symptoms resolve when you use the patch. -Continue using the scopolamine  patches as needed. -We have refilled your prescription for the scopolamine  patches.  MANAGEMENT OF SCOPOLAMINE  PATCH FOR NEUROLOGICAL SYMPTOMS: The scopolamine  patches help manage your dizziness and headaches. You have been using medical tape to keep the patches in place, and your insurance covers two patches per day. -Continue using medical tape to secure the scopolamine  patches. -We have refilled your prescription for the scopolamine  patches.              Contains text generated by [employer].

## 2024-11-23 ENCOUNTER — Encounter: Payer: Self-pay | Admitting: Neurology

## 2025-02-25 ENCOUNTER — Ambulatory Visit: Admitting: Neurology

## 2025-03-10 ENCOUNTER — Ambulatory Visit: Admitting: Neurology

## 2025-11-10 ENCOUNTER — Encounter: Admitting: Physical Medicine and Rehabilitation
